# Patient Record
Sex: Female | Born: 1945
Health system: Southern US, Community
[De-identification: ages and names within clinical notes are randomized; demographics above are authoritative.]

## PROBLEM LIST (undated history)

## (undated) DIAGNOSIS — I700 Atherosclerosis of aorta: Secondary | ICD-10-CM

## (undated) DIAGNOSIS — Z5189 Encounter for other specified aftercare: Secondary | ICD-10-CM

## (undated) DIAGNOSIS — H269 Unspecified cataract: Secondary | ICD-10-CM

## (undated) DIAGNOSIS — I10 Essential (primary) hypertension: Secondary | ICD-10-CM

## (undated) DIAGNOSIS — F32A Depression, unspecified: Secondary | ICD-10-CM

## (undated) DIAGNOSIS — R931 Abnormal findings on diagnostic imaging of heart and coronary circulation: Secondary | ICD-10-CM

## (undated) DIAGNOSIS — E785 Hyperlipidemia, unspecified: Secondary | ICD-10-CM

## (undated) DIAGNOSIS — D649 Anemia, unspecified: Secondary | ICD-10-CM

## (undated) DIAGNOSIS — M549 Dorsalgia, unspecified: Secondary | ICD-10-CM

## (undated) DIAGNOSIS — M353 Polymyalgia rheumatica: Secondary | ICD-10-CM

## (undated) DIAGNOSIS — I6381 Other cerebral infarction due to occlusion or stenosis of small artery: Secondary | ICD-10-CM

## (undated) DIAGNOSIS — H33309 Unspecified retinal break, unspecified eye: Secondary | ICD-10-CM

## (undated) DIAGNOSIS — H348392 Tributary (branch) retinal vein occlusion, unspecified eye, stable: Secondary | ICD-10-CM

## (undated) DIAGNOSIS — G8190 Hemiplegia, unspecified affecting unspecified side: Secondary | ICD-10-CM

## (undated) DIAGNOSIS — S46009A Unspecified injury of muscle(s) and tendon(s) of the rotator cuff of unspecified shoulder, initial encounter: Secondary | ICD-10-CM

## (undated) DIAGNOSIS — F419 Anxiety disorder, unspecified: Secondary | ICD-10-CM

## (undated) DIAGNOSIS — M1990 Unspecified osteoarthritis, unspecified site: Secondary | ICD-10-CM

## (undated) HISTORY — DX: Unspecified retinal break, unspecified eye: H33.309

## (undated) HISTORY — DX: Polymyalgia rheumatica: M35.3

## (undated) HISTORY — DX: Anemia, unspecified: D64.9

## (undated) HISTORY — PX: SPHINCTEROTOMY: SHX5279

## (undated) HISTORY — DX: Dorsalgia, unspecified: M54.9

## (undated) HISTORY — DX: Other cerebral infarction due to occlusion or stenosis of small artery: I63.81

## (undated) HISTORY — DX: Abnormal findings on diagnostic imaging of heart and coronary circulation: R93.1

## (undated) HISTORY — DX: Encounter for other specified aftercare: Z51.89

## (undated) HISTORY — DX: Atherosclerosis of aorta: I70.0

## (undated) HISTORY — PX: ABDOMINAL HYSTERECTOMY: SHX81

## (undated) HISTORY — PX: CHOLECYSTECTOMY: SHX55

## (undated) HISTORY — DX: Depression, unspecified: F32.A

## (undated) HISTORY — DX: Unspecified osteoarthritis, unspecified site: M19.90

## (undated) HISTORY — DX: Hyperlipidemia, unspecified: E78.5

## (undated) HISTORY — DX: Unspecified cataract: H26.9

## (undated) HISTORY — DX: Anxiety disorder, unspecified: F41.9

## (undated) HISTORY — DX: Unspecified injury of muscle(s) and tendon(s) of the rotator cuff of unspecified shoulder, initial encounter: S46.009A

## (undated) HISTORY — DX: Essential (primary) hypertension: I10

## (undated) HISTORY — PX: UPPER GASTROINTESTINAL ENDOSCOPY: SHX188

## (undated) HISTORY — DX: Tributary (branch) retinal vein occlusion, unspecified eye, stable: H34.8392

## (undated) HISTORY — DX: Hemiplegia, unspecified affecting unspecified side: G81.90

---

## 1998-07-01 ENCOUNTER — Other Ambulatory Visit: Admission: RE | Admit: 1998-07-01 | Discharge: 1998-07-01 | Payer: Self-pay | Admitting: Obstetrics and Gynecology

## 1999-08-16 ENCOUNTER — Other Ambulatory Visit: Admission: RE | Admit: 1999-08-16 | Discharge: 1999-08-16 | Payer: Self-pay | Admitting: Obstetrics and Gynecology

## 2001-02-13 ENCOUNTER — Encounter: Payer: Self-pay | Admitting: Internal Medicine

## 2001-02-13 ENCOUNTER — Ambulatory Visit (HOSPITAL_COMMUNITY): Admission: RE | Admit: 2001-02-13 | Discharge: 2001-02-13 | Payer: Self-pay | Admitting: Internal Medicine

## 2001-04-18 ENCOUNTER — Other Ambulatory Visit: Admission: RE | Admit: 2001-04-18 | Discharge: 2001-04-18 | Payer: Self-pay | Admitting: Obstetrics and Gynecology

## 2002-08-31 ENCOUNTER — Other Ambulatory Visit: Admission: RE | Admit: 2002-08-31 | Discharge: 2002-08-31 | Payer: Self-pay | Admitting: Obstetrics and Gynecology

## 2003-04-13 ENCOUNTER — Emergency Department (HOSPITAL_COMMUNITY): Admission: AD | Admit: 2003-04-13 | Discharge: 2003-04-13 | Payer: Self-pay | Admitting: Emergency Medicine

## 2003-04-13 ENCOUNTER — Encounter: Payer: Self-pay | Admitting: Emergency Medicine

## 2003-05-11 ENCOUNTER — Emergency Department (HOSPITAL_COMMUNITY): Admission: EM | Admit: 2003-05-11 | Discharge: 2003-05-11 | Payer: Self-pay | Admitting: Emergency Medicine

## 2003-05-11 ENCOUNTER — Encounter: Payer: Self-pay | Admitting: Emergency Medicine

## 2004-02-15 ENCOUNTER — Ambulatory Visit (HOSPITAL_COMMUNITY): Admission: RE | Admit: 2004-02-15 | Discharge: 2004-02-15 | Payer: Self-pay | Admitting: Internal Medicine

## 2004-02-24 ENCOUNTER — Ambulatory Visit (HOSPITAL_COMMUNITY): Admission: RE | Admit: 2004-02-24 | Discharge: 2004-02-24 | Payer: Self-pay | Admitting: Internal Medicine

## 2004-03-08 IMAGING — CT CT HEAD W/O CM
1 series · 16 of 30 positions shown, 20 images · non-contrast
Comparison: none

FINDINGS
CLINICAL DATA: RIGHT LEG WEAKNESS.  HYPERTENSIVE.
COMPUTERIZED CRANIAL TOMOGRAPHY WITHOUT CONTRAST- 05/11/2003
THERE IS NO EVIDENCE OF AN ACUTE HEMORRHAGE, INFARCTION, OR MASS LESION.  IMAGES THROUGH THE BRAIN
STEM DEMONSTRATE SOME TINY LUCENCIES IN THE PONS BUT THIS MAY REPRESENT ARTIFACT CAUSED BY THE
ADJACENT TEMPORAL BONES.  VENTRICLES ARE NORMAL IN SIZE AND CONFIGURATION.  NO BONY ABNORMALITY.
IMPRESSION
NO SIGNIFICANT ABNORMALITY.

[Series 2: trauma head · axial · 0.47mm/px · z∈[+156,+297]mm · 16 of 30 slices shown, 20 images]
[im 2/30  brain]
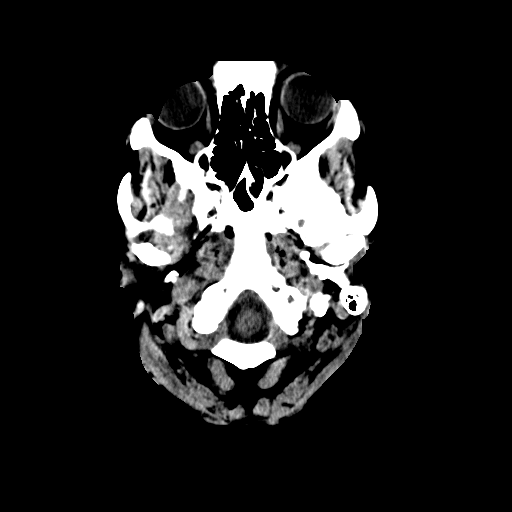
[im 2/30  bone]
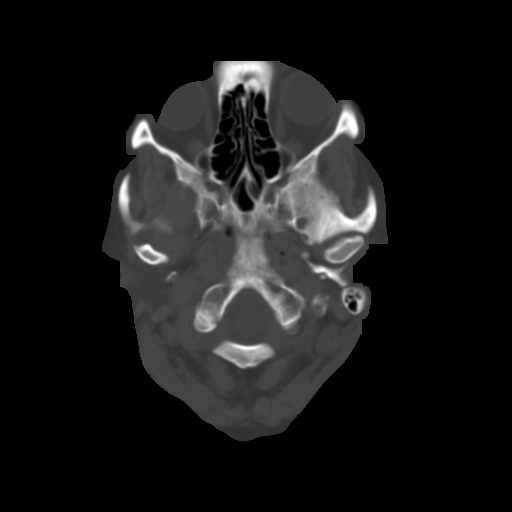
[im 4/30  brain]
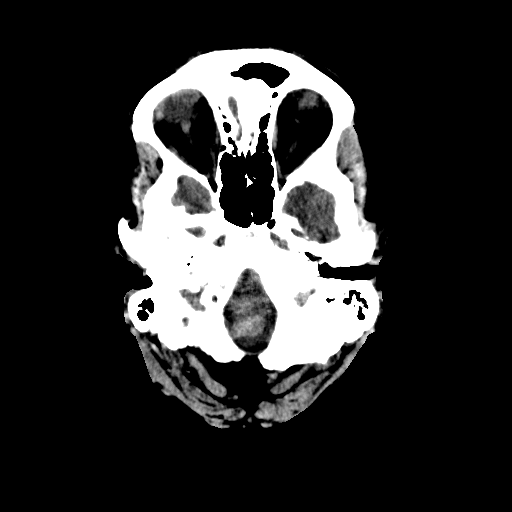
[im 6/30  brain]
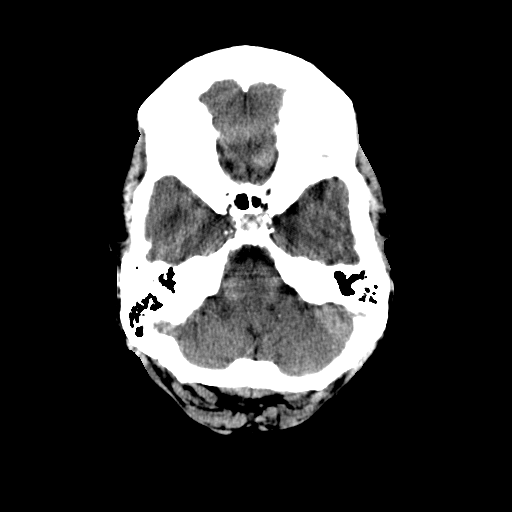
[im 8/30  brain]
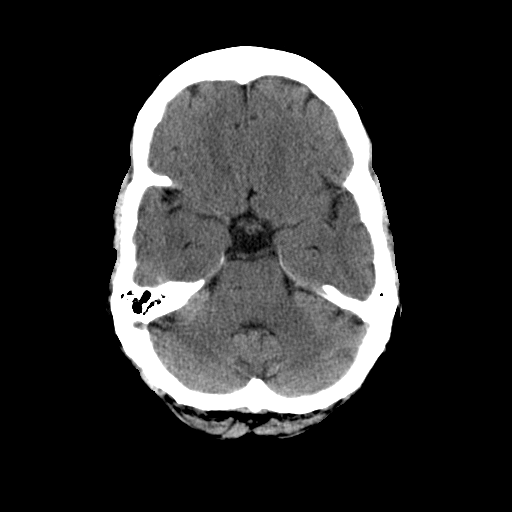
[im 9/30  brain]
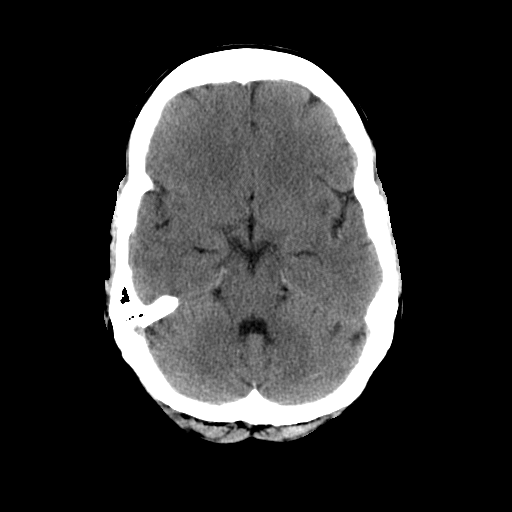
[im 9/30  bone]
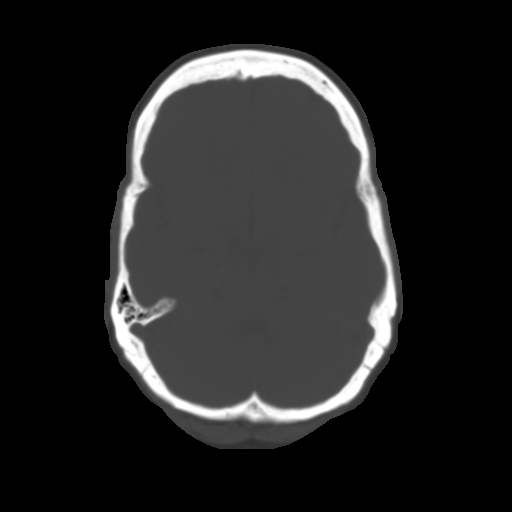
[im 11/30  brain]
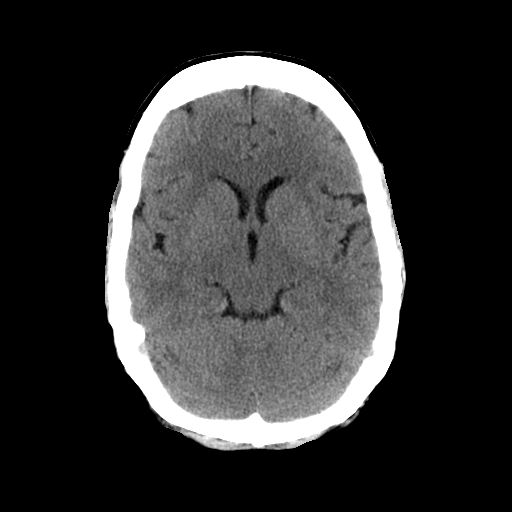
[im 13/30  brain]
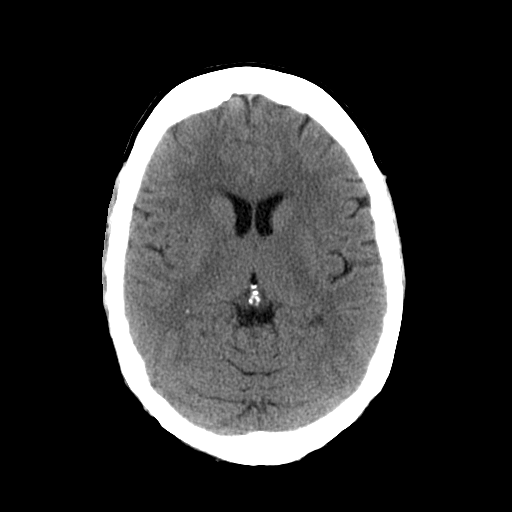
[im 15/30  brain]
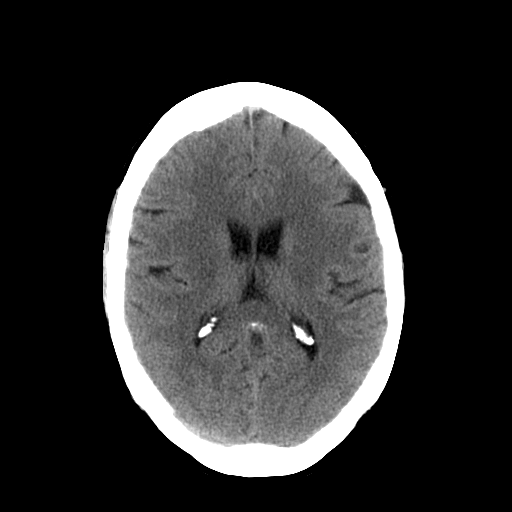
[im 16/30  brain]
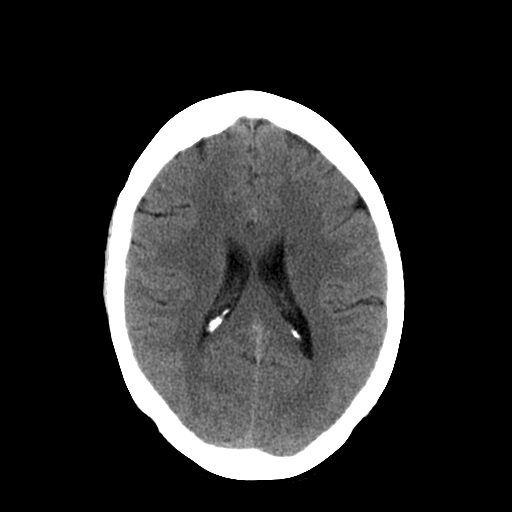
[im 16/30  bone]
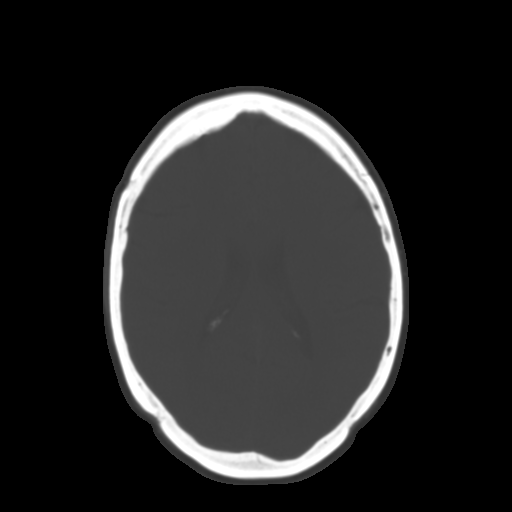
[im 18/30  brain]
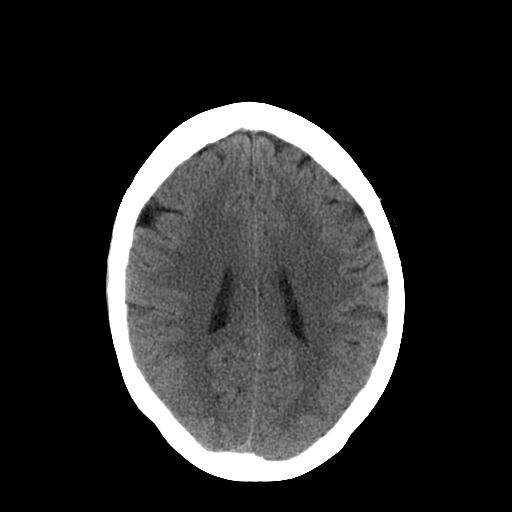
[im 20/30  brain]
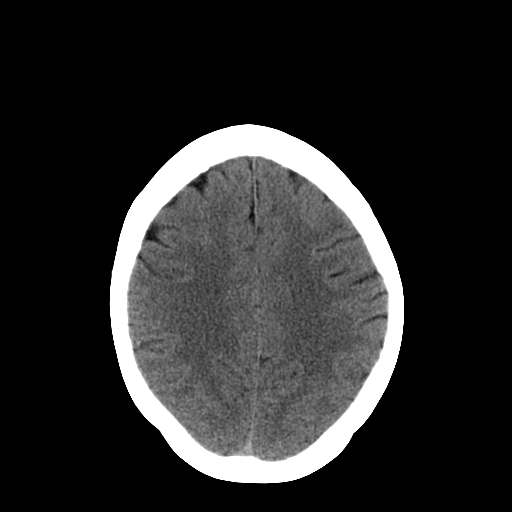
[im 22/30  brain]
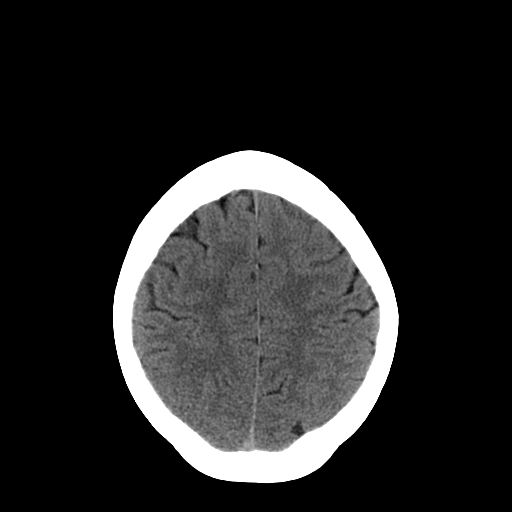
[im 23/30  brain]
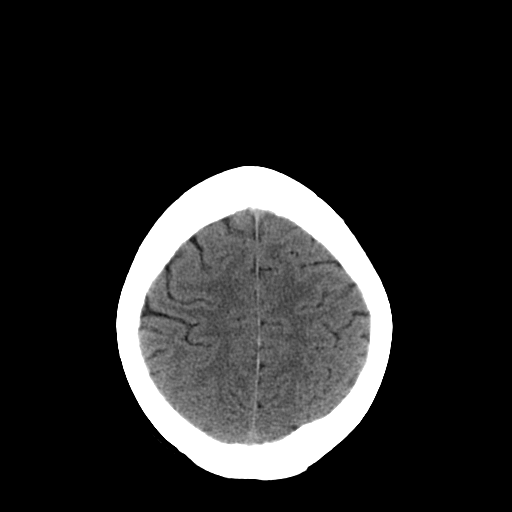
[im 23/30  bone]
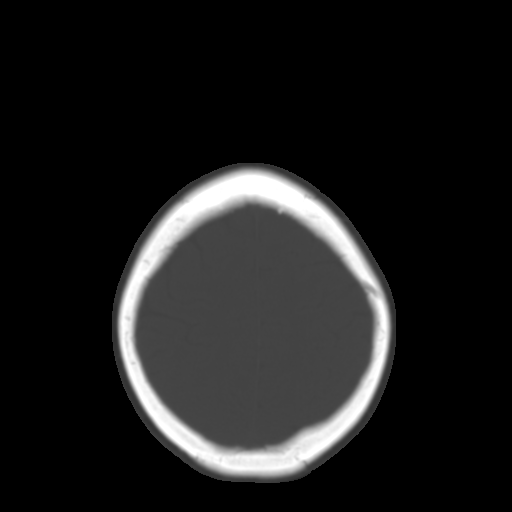
[im 25/30  brain]
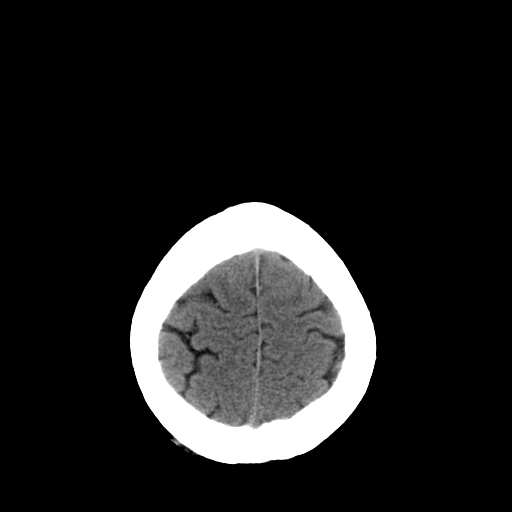
[im 27/30  brain]
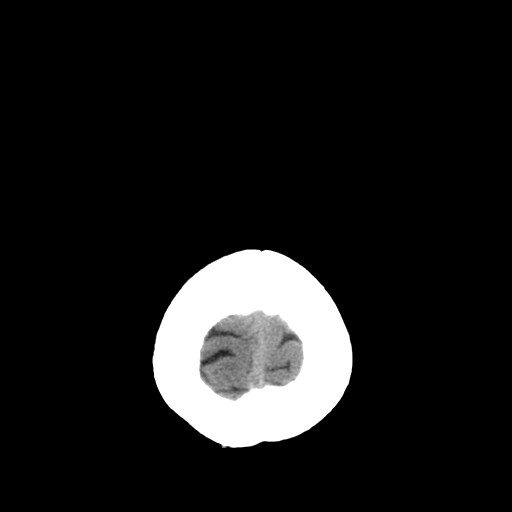
[im 29/30  brain]
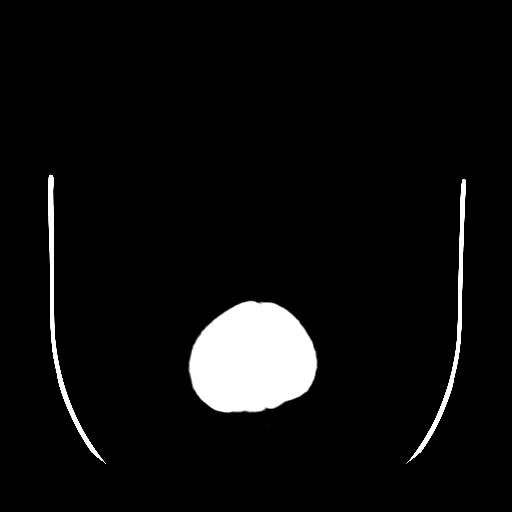

[16 of 30 positions shown; findings below may reference images not displayed]

## 2004-05-01 ENCOUNTER — Ambulatory Visit (HOSPITAL_COMMUNITY): Admission: RE | Admit: 2004-05-01 | Discharge: 2004-05-01 | Payer: Self-pay | Admitting: Neurology

## 2004-12-07 ENCOUNTER — Ambulatory Visit (HOSPITAL_COMMUNITY): Admission: RE | Admit: 2004-12-07 | Discharge: 2004-12-07 | Payer: Self-pay | Admitting: Neurology

## 2005-01-05 ENCOUNTER — Ambulatory Visit: Payer: Self-pay

## 2005-11-15 ENCOUNTER — Ambulatory Visit: Payer: Self-pay | Admitting: Cardiology

## 2005-11-18 ENCOUNTER — Emergency Department (HOSPITAL_COMMUNITY): Admission: EM | Admit: 2005-11-18 | Discharge: 2005-11-18 | Payer: Self-pay | Admitting: Family Medicine

## 2006-08-14 ENCOUNTER — Ambulatory Visit (HOSPITAL_COMMUNITY): Admission: RE | Admit: 2006-08-14 | Discharge: 2006-08-14 | Payer: Self-pay | Admitting: Internal Medicine

## 2006-08-14 ENCOUNTER — Ambulatory Visit: Payer: Self-pay | Admitting: Internal Medicine

## 2006-09-25 ENCOUNTER — Ambulatory Visit: Payer: Self-pay | Admitting: Internal Medicine

## 2006-09-25 LAB — CONVERTED CEMR LAB
ALT: 16 units/L (ref 0–40)
AST: 29 units/L (ref 0–37)
Albumin: 3.5 g/dL (ref 3.5–5.2)
Alkaline Phosphatase: 62 units/L (ref 39–117)
BUN: 13 mg/dL (ref 6–23)
Basophils Absolute: 0.1 10*3/uL (ref 0.0–0.1)
Basophils Relative: 0.7 % (ref 0.0–1.0)
Bilirubin, Direct: 0.3 mg/dL (ref 0.0–0.3)
CO2: 31 meq/L (ref 19–32)
Calcium: 9.1 mg/dL (ref 8.4–10.5)
Chloride: 100 meq/L (ref 96–112)
Cholesterol: 202 mg/dL (ref 0–200)
Creatinine, Ser: 0.9 mg/dL (ref 0.4–1.2)
Direct LDL: 149.1 mg/dL
Eosinophils Absolute: 0.1 10*3/uL (ref 0.0–0.6)
Eosinophils Relative: 1 % (ref 0.0–5.0)
GFR calc Af Amer: 82 mL/min
GFR calc non Af Amer: 68 mL/min
Glucose, Bld: 98 mg/dL (ref 70–99)
HCT: 41.3 % (ref 36.0–46.0)
HDL: 43.4 mg/dL (ref >39.0)
Hemoglobin: 14.3 g/dL (ref 12.0–15.0)
Ketones, ur: NEGATIVE mg/dL
Leukocytes, UA: NEGATIVE
Lymphocytes Relative: 38.8 % (ref 12.0–46.0)
MCHC: 34.7 g/dL (ref 30.0–36.0)
MCV: 83.8 fL (ref 78.0–100.0)
Monocytes Absolute: 0.7 10*3/uL (ref 0.2–0.7)
Monocytes Relative: 8.2 % (ref 3.0–11.0)
Neutro Abs: 4.4 10*3/uL (ref 1.4–7.7)
Neutrophils Relative %: 51.3 % (ref 43.0–77.0)
Nitrite: NEGATIVE
Platelets: 249 10*3/uL (ref 150–400)
Potassium: 3.5 meq/L (ref 3.5–5.1)
RBC: 4.93 M/uL (ref 3.87–5.11)
RDW: 13 % (ref 11.5–14.6)
Sodium: 141 meq/L (ref 135–145)
Specific Gravity, Urine: >=1.03 (ref 1.000–1.03)
TSH: 2.24 microintl units/mL (ref 0.35–5.50)
Total Bilirubin: 1.3 mg/dL — ABNORMAL HIGH (ref 0.3–1.2)
Total CHOL/HDL Ratio: 4.7
Total Protein, Urine: NEGATIVE mg/dL
Total Protein: 7.2 g/dL (ref 6.0–8.3)
Triglycerides: 128 mg/dL (ref 0–149)
Urine Glucose: NEGATIVE mg/dL
Urobilinogen, UA: 0.2 (ref 0.0–1.0)
VLDL: 26 mg/dL (ref 0–40)
WBC: 8.6 10*3/uL (ref 4.5–10.5)
pH: 5.5 (ref 5.0–8.0)

## 2006-10-02 ENCOUNTER — Ambulatory Visit: Payer: Self-pay | Admitting: Internal Medicine

## 2006-10-22 ENCOUNTER — Ambulatory Visit: Payer: Self-pay | Admitting: Internal Medicine

## 2006-10-22 LAB — CONVERTED CEMR LAB
ALT: 14 units/L (ref 0–40)
AST: 23 units/L (ref 0–37)
Cholesterol: 202 mg/dL (ref 0–200)
Direct LDL: 127.3 mg/dL
HDL: 37.2 mg/dL — ABNORMAL LOW (ref >39.0)
Total CHOL/HDL Ratio: 5.4
Triglycerides: 287 mg/dL (ref 0–149)
VLDL: 57 mg/dL — ABNORMAL HIGH (ref 0–40)

## 2007-08-12 ENCOUNTER — Encounter: Payer: Self-pay | Admitting: Internal Medicine

## 2007-08-12 DIAGNOSIS — H348392 Tributary (branch) retinal vein occlusion, unspecified eye, stable: Secondary | ICD-10-CM | POA: Insufficient documentation

## 2007-08-12 DIAGNOSIS — I10 Essential (primary) hypertension: Secondary | ICD-10-CM | POA: Insufficient documentation

## 2007-12-22 ENCOUNTER — Telehealth: Payer: Self-pay | Admitting: Internal Medicine

## 2008-01-26 ENCOUNTER — Telehealth: Payer: Self-pay | Admitting: Internal Medicine

## 2008-02-08 ENCOUNTER — Ambulatory Visit: Payer: Self-pay | Admitting: Internal Medicine

## 2008-02-08 ENCOUNTER — Inpatient Hospital Stay (HOSPITAL_COMMUNITY): Admission: EM | Admit: 2008-02-08 | Discharge: 2008-02-10 | Payer: Self-pay | Admitting: Emergency Medicine

## 2008-02-10 ENCOUNTER — Telehealth: Payer: Self-pay | Admitting: Internal Medicine

## 2008-02-18 ENCOUNTER — Encounter: Payer: Self-pay | Admitting: Internal Medicine

## 2008-02-18 ENCOUNTER — Ambulatory Visit: Payer: Self-pay

## 2008-02-23 ENCOUNTER — Telehealth: Payer: Self-pay | Admitting: Internal Medicine

## 2008-03-03 ENCOUNTER — Telehealth: Payer: Self-pay | Admitting: Internal Medicine

## 2008-03-11 ENCOUNTER — Ambulatory Visit: Payer: Self-pay

## 2009-01-19 ENCOUNTER — Telehealth: Payer: Self-pay | Admitting: Internal Medicine

## 2009-08-11 ENCOUNTER — Telehealth (INDEPENDENT_AMBULATORY_CARE_PROVIDER_SITE_OTHER): Payer: Self-pay | Admitting: *Deleted

## 2009-08-19 ENCOUNTER — Encounter (INDEPENDENT_AMBULATORY_CARE_PROVIDER_SITE_OTHER): Payer: Self-pay | Admitting: *Deleted

## 2009-08-26 ENCOUNTER — Ambulatory Visit: Payer: Self-pay | Admitting: Endocrinology

## 2009-11-02 ENCOUNTER — Ambulatory Visit: Payer: Self-pay | Admitting: Internal Medicine

## 2009-11-02 LAB — CONVERTED CEMR LAB
ALT: 16 units/L (ref 0–35)
AST: 21 units/L (ref 0–37)
Albumin: 4.1 g/dL (ref 3.5–5.2)
Alkaline Phosphatase: 61 units/L (ref 39–117)
BUN: 14 mg/dL (ref 6–23)
Basophils Absolute: 0 10*3/uL (ref 0.0–0.1)
Basophils Relative: 0.5 % (ref 0.0–3.0)
Bilirubin Urine: NEGATIVE
Bilirubin, Direct: 0.1 mg/dL (ref 0.0–0.3)
CO2: 30 meq/L (ref 19–32)
Calcium: 9.4 mg/dL (ref 8.4–10.5)
Chloride: 103 meq/L (ref 96–112)
Cholesterol: 260 mg/dL — ABNORMAL HIGH (ref 0–200)
Creatinine, Ser: 0.8 mg/dL (ref 0.4–1.2)
Direct LDL: 173.7 mg/dL
Eosinophils Absolute: 0.1 10*3/uL (ref 0.0–0.7)
Eosinophils Relative: 0.8 % (ref 0.0–5.0)
GFR calc non Af Amer: 76.91 mL/min (ref >60)
Glucose, Bld: 93 mg/dL (ref 70–99)
HCT: 42.3 % (ref 36.0–46.0)
HDL: 49.7 mg/dL (ref >39.00)
Hemoglobin: 14.4 g/dL (ref 12.0–15.0)
Ketones, ur: NEGATIVE mg/dL
Leukocytes, UA: NEGATIVE
Lymphocytes Relative: 32.4 % (ref 12.0–46.0)
Lymphs Abs: 2.6 10*3/uL (ref 0.7–4.0)
MCHC: 34.1 g/dL (ref 30.0–36.0)
MCV: 85.5 fL (ref 78.0–100.0)
Monocytes Absolute: 0.5 10*3/uL (ref 0.1–1.0)
Monocytes Relative: 6.5 % (ref 3.0–12.0)
Neutro Abs: 4.8 10*3/uL (ref 1.4–7.7)
Neutrophils Relative %: 59.8 % (ref 43.0–77.0)
Nitrite: NEGATIVE
Platelets: 233 10*3/uL (ref 150.0–400.0)
Potassium: 3.6 meq/L (ref 3.5–5.1)
RBC: 4.95 M/uL (ref 3.87–5.11)
RDW: 14.8 % — ABNORMAL HIGH (ref 11.5–14.6)
Sodium: 143 meq/L (ref 135–145)
Specific Gravity, Urine: >=1.03 (ref 1.000–1.030)
TSH: 1.53 microintl units/mL (ref 0.35–5.50)
Total Bilirubin: 0.8 mg/dL (ref 0.3–1.2)
Total CHOL/HDL Ratio: 5
Total Protein, Urine: NEGATIVE mg/dL
Total Protein: 7.1 g/dL (ref 6.0–8.3)
Triglycerides: 233 mg/dL — ABNORMAL HIGH (ref 0.0–149.0)
Urine Glucose: NEGATIVE mg/dL
Urobilinogen, UA: 0.2 (ref 0.0–1.0)
VLDL: 46.6 mg/dL — ABNORMAL HIGH (ref 0.0–40.0)
WBC: 8.1 10*3/uL (ref 4.5–10.5)
pH: 5 (ref 5.0–8.0)

## 2009-11-08 ENCOUNTER — Ambulatory Visit: Payer: Self-pay | Admitting: Internal Medicine

## 2009-11-08 DIAGNOSIS — M67919 Unspecified disorder of synovium and tendon, unspecified shoulder: Secondary | ICD-10-CM | POA: Insufficient documentation

## 2009-11-08 DIAGNOSIS — H33309 Unspecified retinal break, unspecified eye: Secondary | ICD-10-CM | POA: Insufficient documentation

## 2009-11-08 DIAGNOSIS — M719 Bursopathy, unspecified: Secondary | ICD-10-CM

## 2009-11-22 DIAGNOSIS — I6350 Cerebral infarction due to unspecified occlusion or stenosis of unspecified cerebral artery: Secondary | ICD-10-CM | POA: Insufficient documentation

## 2009-11-22 DIAGNOSIS — E785 Hyperlipidemia, unspecified: Secondary | ICD-10-CM | POA: Insufficient documentation

## 2009-11-22 DIAGNOSIS — M549 Dorsalgia, unspecified: Secondary | ICD-10-CM | POA: Insufficient documentation

## 2009-11-23 ENCOUNTER — Encounter: Payer: Self-pay | Admitting: Internal Medicine

## 2009-12-13 ENCOUNTER — Encounter: Payer: Self-pay | Admitting: Internal Medicine

## 2010-01-11 ENCOUNTER — Encounter: Payer: Self-pay | Admitting: Internal Medicine

## 2010-01-11 HISTORY — PX: COLONOSCOPY: SHX174

## 2010-07-05 ENCOUNTER — Encounter: Payer: Self-pay | Admitting: Internal Medicine

## 2010-08-15 NOTE — Letter (Signed)
Summary: Primary Care Appointment Letter  [facility]  [address] [place], [place] [address]   Phone: [phone]  Fax: [phone]    12/13/2009 MRN: [id number]  [name] [address] [place], [place]  [address]  Dear Ms. Sedlack,   Your Primary Care Physician Jacques Navy MD has indicated that:    ___X____it is time to schedule an appointment with Dr. Debby Bud.  Please call the office to schedule.    _______you missed your appointment on______ and need to call and          reschedule.    _______you need to have lab work done.    _______you need to schedule an appointment discuss lab or test results.    _______you need to call to reschedule your appointment that is                       scheduled on _________.     Please call our office as soon as possible. Our phone number is [phone]. Please press option 1. Our office is open 8a-12noon and 1p-5p, Monday through Friday.     Thank you,    [place] [facility] Sched[facility]

## 2010-08-15 NOTE — Letter (Signed)
Summary: Generic Letter  [facility]  [address] [place], [place] [address]   Phone: [phone]  Fax: [phone]    08/19/2009  KALAYAH LESKE [address] [place], [place]  [address]  Dear Ms. Wirthlin,   Our office has tried to contact you several times with no sucsess. Please give our office a call at [phone] to set up an appointment with Dr Debby Bud.          Sincerely,   Ami Bullins CMA
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
## 2010-08-15 NOTE — Progress Notes (Signed)
  Phone Note Other Incoming   Summary of Call: I just refill pt's Univasc with one refill. When do you suggest pt come in the office for an office visit. she hasn't been here since 2008. Please Advise. Thank You Initial call taken by: Ami Bullins CMA,  August 11, 2009 2:14 PM  Follow-up for Phone Call        she needs to come in during this month's prescription Follow-up by: Jacques Navy MD,  August 11, 2009 5:44 PM  Additional Follow-up for Phone Call Additional follow up Details #1::        lm for pt to call office to set up appt. Additional Follow-up by: Ami Bullins CMA,  August 12, 2009 8:51 AM    Additional Follow-up for Phone Call Additional follow up Details #2::    will send note to pt to inform her to call office to sch appt. Follow-up by: Ami Bullins CMA,  August 19, 2009 1:39 PM

## 2010-08-15 NOTE — Procedures (Signed)
Summary: Colonoscopy / [facility]  Colonoscopy / [facility]   Imported By: Lennie Odor 01/27/2010 14:49:44  _____________________________________________________________________  External Attachment:    Type:   Image     Comment:   External Document

## 2010-08-17 NOTE — Letter (Signed)
Summary: [facility]  [facility]   Imported By: Lester [place] 07/13/2010 11:32:26  _____________________________________________________________________  External Attachment:    Type:   Image     Comment:   External Document

## 2010-08-18 NOTE — Letter (Signed)
Summary: [facility]   Imported By: Lester [place] 12/08/2009 08:59:38  _____________________________________________________________________  External Attachment:    Type:   Image     Comment:   External Document

## 2010-11-11 ENCOUNTER — Other Ambulatory Visit: Payer: Self-pay | Admitting: Internal Medicine
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
## 2010-11-28 NOTE — Discharge Summary (Signed)
NAMEXANDRA, LARAMEE              ACCOUNT NO.:  [id number]   MEDICAL RECORD NO.:  [id number]          PATIENT TYPE:  INP   LOCATION:  4731                         FACILITY:  [facility]   PHYSICIAN:  Valerie A. Felicity Coyer, MDDATE OF BIRTH:  02/08/1946   DATE OF ADMISSION:  02/08/2008  DATE OF DISCHARGE:  02/10/2008                               DISCHARGE SUMMARY   DISCHARGE DIAGNOSES:  1. Uncontrolled hypertension.  2. Atypical chest pain.  3. Hyperlipidemia.   HISTORY OF PRESENT ILLNESS:  Ms. Waheed is a 65 year old white female  with past medical history of hypertension and hyperlipidemia who  presented to [facility] on day of admission with  increased blood pressure over the past 2-3 days.  The patient describes  checking blood pressure at home with significantly elevated systolic  blood pressures in the 200s.  The patient is fairly asymptomatic per her  report, however, does report some increased dizziness with head  movement.  She denied any vertigo or weakness.  Also of note upon  evaluation, the patient reported intermittent chest pain worse on  movement with increased fatigue times several years.  Upon evaluation in  the ER, the patient found to have blood pressure of 205/92 with repeat  blood pressures 184/95.  Lab work upon admission was unremarkable.  CT  scan of the head was unremarkable.  However, due to the patient's risk  factors and elevated blood pressure, the patient was admitted for  further evaluation and treatment.   CONSULTATIONS DURING THIS HOSPITALIZATION:  [facility], Bevelyn Buckles. Bensimhon, MD   PAST MEDICAL HISTORY:  Hypertension.   COURSE OF HOSPITALIZATION:  1. Uncontrolled hypertension.  The patient admit that she feels      uncontrolled hypertension, new onset secondary to new prescription      received in mail.  She feels the pills are different from that she      was taking prior, however, review of prescription bottles  reveals      same medications.  However, they are different in appearance.  The      patient also admits to labs and medication while awaiting on mail-      order to arrive.  At the time of dictation, Norvasc has been added      to the patient's blood pressure medication regimen; however, she is      quite reluctant to begin any new medications despite instruction,      otherwise.  A long discussion had with the patient concerning risk      factors of high blood pressure.  She verified understanding and      agreed to start new medications to be followed up by primary care      physician a followup appointment.  2. Atypical chest pain.  Of note, the patient with risk factors      including hyperlipidemia, obesity, and with significant family      history of CAD.  The patient with very nonspecific complaints of      approximately 1-2 years of intermittent chest pain, midsternal,  nonradiating, and worse upon movement also with increased fatigue      x1 to 2 years.  Due to the patient's complaints and multiple risk      factors, Cardiology was asked to see the patient in consultation.      Cardiac enzymes were cycled and negative x3.  The patient's EKG was      nondiagnostic.  Cardiology has recommended outpatient ETT Myoview      as well as renovascular ultrasound for uncontrolled hypertension.      Please see complete Cardiology consultation note for further      details.  3. Hyperlipidemia.  Upon discussion with the patient, the patient      discontinued her Lipitor some time in the last year.  She felt it      was causing a constipation.  When discussed fasting lipid panel      findings with the patient, she is somewhat agreeable to starting      Lipitor again.  She does not want any new medication, but is      willing to try the Lipitor.  Again, the importance of med      compliance was discussed with the patient.   MEDICATIONS AT THE TIME OF DISCHARGE:  1. Aspirin 81 mg p.o.  daily.  2. Norvasc 5 mg p.o. daily.  Note, this is a new medication.  3. Univasc 15 mg p.o. b.i.d.  4. Hydrochlorothiazide 25 mg p.o. daily.  5. Lipitor 10 mg p.o. nightly.  Note, this is a new medication as the      patient was not taking as previously described.   OUTPATIENT SCHEDULED TEST:  1. Renal artery ultrasound to be done on March 11, 2008, at 8:30 a.m.  2. Exercise stress test to be done on February 18, 2008, and 9:15 a.m. at      [employer].   PERTINENT LABORATORY WORK DURING HOSPITALIZATION:  Cardiac enzymes again  negative x3.  Total cholesterol 241, triglycerides 201, HDL 35, LDL 166,  and TSH 1.610.   DISPOSITION:  The patient felt medically stable for discharge home at  this time.  She is instructed to follow up with scheduled test as  mentioned above.  In addition, the patient is scheduled for followup  with her primary care physician, Dr. Illene Regulus on Monday, February 23, 2008, at 11:10 a.m. at which time she may need further counseling  regarding compliance with medications.      Cordelia Pen, NP      Raenette Rover. Felicity Coyer, MD  Electronically Signed    LE/MEDQ  D:  02/10/2008  T:  02/11/2008  Job:  [id number]   cc:   Rosalyn Gess. Norins, MD  Bevelyn Buckles. Bensimhon, MD

## 2010-11-28 NOTE — H&P (Signed)
NAMEJAI, BEAR NO.:  [id number]   MEDICAL RECORD NO.:  [id number]          PATIENT TYPE:  EMS   LOCATION:  MAJO                         FACILITY:  [facility]   PHYSICIAN:  Therisa Doyne, MD    DATE OF BIRTH:  [birth year]   DATE OF ADMISSION:  02/08/2008  DATE OF DISCHARGE:                              HISTORY & PHYSICAL   PRIMARY CARE PHYSICIAN:  Rosalyn Gess. Norins, M.D. at [facility]   CHIEF COMPLAINT:  Elevated blood pressure.   HISTORY OF PRESENT ILLNESS:  A 65 year old white female with a past  medical history significant for hypertension who presents with increased  blood pressure over the past 2-3 days.  The patient reports that she  recently got a new shipment of Univasc through a mail-order prescription  supplier.  Since that time she has been checking her blood pressure and  has noted that it has been significantly elevated with systolic blood  pressure in the 200s.  She has been fairly asymptomatic, but does report  increased dizziness with head movement.  She denies any vertigo.  She  notes occasional headaches, but states this has been chronic and has not  changed in frequency.  She denies any active chest pain or shortness of  breath or other complaints.   The patient does report a history of chest pain while gardening this  past week, but noticed that the chest pain was only when she bent down  to dig in the ground and when she stood up it went away.  She does  complain of dyspnea on exertion when she climbs a flight of stairs, but  states that this has been present for the past 2-3 years and has been  unchanged.  She denies any chest pain with exertion.   REVIEW OF SYSTEMS:  All systems were reviewed and were negative except  as mentioned above in the history of present illness.   PAST MEDICAL HISTORY:  Hypertension.   SOCIAL HISTORY:  The patient lives in [place].  She denies tobacco  or alcohol.   FAMILY HISTORY:  Positive for  hypertension as well as the fact that her  mother died in her 32s secondary to myocardial infarction.   ALLERGIES:  No known drug allergies.   MEDICATIONS:  1. Toprol XL 25 mg b.i.d.  2. Hydrochlorothiazide 25 mg daily.  3. Univasc 15 mg b.i.d.   PHYSICAL EXAMINATION:  VITAL SIGNS:  Temperature 97.9, blood pressure  205/92 with a repeat blood pressure of 184/95, pulse 65, respirations  20, oxygen saturation 98% on room air.  GENERAL:  No acute distress.  HEENT:  Normocephalic and atraumatic.  Pupils equal, round, and reactive  to light and accommodation.  Extraocular movements intact.  Oropharynx  pink and moist without any lesions.  NECK:  Supple with no lymphadenopathy and no jugular venous distention.  No masses.  CARDIOVASCULAR:  Regular rate and rhythm.  No murmurs, rubs, or gallops.  CHEST:  Clear to auscultation bilaterally.  ABDOMEN:  Positive bowel sounds.  Soft, nontender, and nondistended.  EXTREMITIES:  No cyanosis, clubbing, or edema.  NEUROLOGY:  Alert and oriented x3.  Cranial nerves II-XII grossly intact  with no focal deficits.  SKIN:  No rashes.  BACK:  No CVA tenderness.   LABORATORY DATA:  Unremarkable chemistry-8 panel with a normal BUN and  creatinine of 1.1, troponins negative x1 set.  Urinalysis was  unremarkable.   CT scan of the head; no acute intracranial disease.   Chest x-ray; no acute cardiopulmonary disease.   EKG shows normal sinus rhythm at 60 beats per minute with nonspecific ST  T wave changes as well as T wave flattening and T wave inversions in the  lateral leads including lead 1, aVL, V5 and V6.  These nonspecific  changes were present on prior EKGs but are more pronounced today.   ASSESSMENT:  The patient is a 65 year old white female with past medical  history significant for hypertension who presents with elevated systolic  blood pressures over the past 1 week and new nonspecific ST T wave  changes on EKG.   PLAN:  1. We will  [facility] in      [place] A. Felicity Coyer, M.D. care to a telemetry unit.   1. Hypertension with new T wave inversions laterally.  The EKG changes      are nonspecific and are likely related to the patient's underlying      hypertension.  However, she does have risk factors for coronary      artery disease including hypertension and a positive family      history.  Because of this, we will monitor on telemetry and rule      the patient out for myocardial infarction with serial cardiac      enzymes.  We will start her on aspirin 325 mg daily and continue      her home dose of beta blocker.  Check a fasting lipid profile for      risk factor modification, and consider a stress test if the patient      rules out for myocardial infarction.   1. Hypertension.  It is unclear why the patient's blood pressure is      elevated now as she reports that her systolic blood pressures have      been under good control for the past several years.  Question if      this could be secondary to renal artery stenosis.  We will check a      renal artery Duplex ultrasound to rule renal artery stenosis out.      She is currently on maximum doses of her hydrochlorothiazide,      Toprol, and Univasc.  We will continue all of these medications.      Additionally we will start her on Norvasc 5 mg daily for systolic      blood pressures that are elevated in the hospital, we will use      p.r.n. IV hydralazine.   1. Fluids, electrolytes, nutrition.  Normal saline at 75 mL per hour.      NPO after midnight for possible stress test in the morning.   1. Deep venous thrombosis prophylaxis with Lovenox.      Therisa Doyne, MD  Electronically Signed     SJT/MEDQ  D:  02/08/2008  T:  02/08/2008  Job:  [id number]

## 2010-11-28 NOTE — Assessment & Plan Note (Signed)
[facility]                                 ON-CALL NOTE   [name], [name]                   MRN:          [id number]  DATE:02/08/2008                            DOB:          Mar 29, 1946    Time is 02:28 p.m.   REGULAR DOCTOR:  Rosalyn Gess. Norins, MD   PHONE NUMBER:  [phone] with area code 336.   She was calling because of blood pressure of 207/111.  I tried calling  this phone number several times.  As per the answering service it was a  nonworking number.  The answering service told me that if she called  back they would let me know right away with a different phone number to  reach her, and tell her that I'll be waiting.     Marne A. Tower, MD  Electronically Signed    MAT/MedQ  DD: 02/08/2008  DT: 02/09/2008  Job #: [id number]

## 2010-12-01 NOTE — Assessment & Plan Note (Signed)
[facility]                           PRIMARY CARE OFFICE NOTE   GERRIE, CASTIGLIA                     MRN:          [id number]  DATE:08/14/2006                            DOB:          1945/11/09    Ms. Olvera is a 65 year old Caucasian woman who presents for  evaluation of back pain.   The patient reports that she fell at [facility] April 02, 2006  having lost her footing on a wet floor.  She initially dropped and hit  her knee then fell backwards.  About 1 hour later she started to have  some pain in her low back.  In the interval since that time patient  reports she feels like there is something crunching in her back and she  has 4 or 5 episodes since September where she has crunching and back  discomfort.  The patient reports she has sensed that there is something  out of place in her back.  She has had no loss of control of bowel or  bladder.  She has had no loss of feeling in her legs.  She has had no  significant weakness.  She does give a history of having had muscle  strains in her back in the past, but had never had any disability or  loss of function.   EXAMINATION:  Temperature was 97.6, blood pressure 144/86, pulse was 80,  weight 210.  GENERAL APPEARANCE:  This is a heavy-set Caucasian woman in no acute  distress.  BACK:  The patient was able to stand with use of her legs only.  She  could flex to touch her toes without difficulty.  She was able to walk  with a normal gait.  She could toe walk without difficulty.  She was  able to step up to the exam table with right and left leg without  assistance.  The patient had 2+ patellar tendon right and left.  The  patient had normal straight leg maneuver in the sitting position.  The  patient had normal sensation to light touch and pin prick and deep  vibratory sensation in both feet.  The patient had no tenderness to  palpitation in the costovertebral angle or along the spinous  process of  her back.   ASSESSMENT/PLAN:  Back pain.  The patient with a normal back exam, with  no evidence of any central neurologic deficits.  She has no limitations  in range of motion or strength.  She had no tenderness to palpation as  noted.   PLAN:  The patient is sent for LS spine films to make sure there is no  anatomic abnormality or derangement.  The patient should use Tylenol for  pain relief on an as needed basis, and if this fails to give her relief  I have recommended using over the counter Aleve 1 or 2 tablets in the  a.m. or p.m.  She should be careful about any gastric irritation that  may result from the use of Aleve.  She is to notify me if she has any  increasing problems with back pain,  specifically loss of strength, loss  of sensation, increasing pain, inability to do her normal activities.  She will be notified by phone tree as to results of her LS spine films.     Rosalyn Gess Norins, MD  Electronically Signed   MEN/MedQ  DD: 08/14/2006  DT: 08/14/2006  Job #: [id number]   cc:   Kalman Jewels

## 2010-12-01 NOTE — Letter (Signed)
October 02, 2006    Pramod P. Pearlean Brownie, MD  [address]  [place], [place] [address]   RE:  [name], [name]  MRN:  [id number]  /  DOB:  1946/03/22   Dear Dr. Pearlean Brownie:   Thank you very much for seeing [name] for evaluation of  progressive worsening and ongoing episodes of right-sided weakness and  loss of mobility.   [name]  has had this problem going on for several years.  She has  been seen and evaluated by Dr. Orlin Hilding in the past.  Her studies are  available in her chart with you but of note, she had an MRI scan of the  brain Dec 07, 2004, read out as showing remote lacunar infarctions of  the right and left aspects of the pons with chronic small vessel  ischemic disease and changes of cerebral deep white matter and thalamic  changes.  There was no evidence of any acute or subacute infarction at  that time.  Patient did come to have a transcranial Doppler study  performed  April 07, 2004, with final conclusion and impression  being that the TCD bubble test was positive for a PFO.  There was a  question of emboli in the left [facility] and she does have presence of a right-  to-left shunt.   In Dr. Bethann Goo last consult note, she had suggested that perhaps [name] would benefit from seeing you as a stroke specialist in regards  to second opinion and any additional treatment that would be necessary.  Dr. Orlin Hilding also mentions the potential of EEG.  In the interval since  2006, when the patient was last seen, she has had no frank events.  She  has been seen in follow-up by Dr. Samule Ohm for peripheral vascular disease  and did come to carotid Doppler studies which showed that she had some  diffuse atherosclerosis but no significant obstructive disease.  The MRI  and MRA had over-read this as showing 50-75% stenosis, not confirmed by  Doppler study.  Patient's last cardiology visit dates from Nov 15, 2005,  and she did move on to having a stress Myoview study.   The results of  that are not available to me on the chart.  I will endeavor to locate  that report and forward it to you.   Patient  has been evaluated for back pain, has been seen by Dr.  Simonne Come, last study being September 11, 2006.  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  She also may have had some derangement of the  left knee.  Patient was sent for physical conditioning and strengthening  and reports that physical therapy made a very large difference in her  condition and she has been feeling better since that time.   CURRENT MEDICATIONS:  1. Hydrochlorothiazide 25 mg daily.  2. Univasc 15 mg b.i.d.  3. Toprol XL 25 mg b.i.d.  4. Patient was on aspirin but discontinued when she had ophthalmologic      problem with a retinal tear and hemorrhage, subsequently treated by      laser therapy and she has not yet resumed taking aspirin which she      will do at today's visit.   LABORATORY DATA:  From September 25, 2006, revealed the  patient to have a  hemoglobin of 14.3 g, white count 8600 with a normal differential.  Chemistries were unremarkable with serum glucose of 98.  Kidney function  was normal with a creatinine of 0.9.  patient's cholesterol was 202 with  triglycerides 128, HDL 43.4 with an LDL of 149.1.  Thyroid function was  normal with a TSH of 2.24.   If I can provide any additional information, other than the Myoview  stress study, that would be of assistance in your evaluation, please do  not hesitate to contact me.   As always, I appreciate your assistance in the care of these nice  patients and look forward to hearing from you.    Sincerely,      Rosalyn Gess. Norins, MD  Electronically Signed    MEN/MedQ  DD: 10/02/2006  DT: 10/02/2006  Job #: [id number]

## 2011-02-05 ENCOUNTER — Other Ambulatory Visit: Payer: Self-pay | Admitting: Internal Medicine

## 2011-02-06 NOTE — Telephone Encounter (Signed)
Rx Done for 1-mth supply Noted: must have OV prior to future refill authorization.

## 2011-02-08 ENCOUNTER — Telehealth: Payer: Self-pay | Admitting: *Deleted

## 2011-02-08 MED ORDER — MOEXIPRIL HCL 15 MG PO TABS: 15.0000 mg | ORAL_TABLET | Freq: Two times a day (BID) | ORAL | Status: DC

## 2011-02-08 NOTE — Telephone Encounter (Signed)
Needs RF - 3 mth supply, done

## 2011-03-20 ENCOUNTER — Ambulatory Visit: Payer: Self-pay

## 2011-03-20 DIAGNOSIS — Z0000 Encounter for general adult medical examination without abnormal findings: Secondary | ICD-10-CM

## 2011-03-20 LAB — LIPID PANEL
Cholesterol: 245 mg/dL — ABNORMAL HIGH (ref 0–200)
HDL: 48 mg/dL (ref >39.00)
Total CHOL/HDL Ratio: 5
Triglycerides: 213 mg/dL — ABNORMAL HIGH (ref 0.0–149.0)
VLDL: 42.6 mg/dL — ABNORMAL HIGH (ref 0.0–40.0)

## 2011-03-20 LAB — CBC WITH DIFFERENTIAL/PLATELET
Basophils Absolute: 0.1 10*3/uL (ref 0.0–0.1)
Basophils Relative: 0.6 % (ref 0.0–3.0)
Eosinophils Absolute: 0.1 10*3/uL (ref 0.0–0.7)
Eosinophils Relative: 1 % (ref 0.0–5.0)
HCT: 43.2 % (ref 36.0–46.0)
Hemoglobin: 14.6 g/dL (ref 12.0–15.0)
Lymphocytes Relative: 33.7 % (ref 12.0–46.0)
Lymphs Abs: 3 10*3/uL (ref 0.7–4.0)
MCHC: 33.9 g/dL (ref 30.0–36.0)
MCV: 85.2 fl (ref 78.0–100.0)
Monocytes Absolute: 0.7 10*3/uL (ref 0.1–1.0)
Monocytes Relative: 7.4 % (ref 3.0–12.0)
Neutro Abs: 5.2 10*3/uL (ref 1.4–7.7)
Neutrophils Relative %: 57.3 % (ref 43.0–77.0)
Platelets: 242 10*3/uL (ref 150.0–400.0)
RBC: 5.07 Mil/uL (ref 3.87–5.11)
RDW: 14 % (ref 11.5–14.6)
WBC: 9 10*3/uL (ref 4.5–10.5)

## 2011-03-20 LAB — BASIC METABOLIC PANEL
BUN: 20 mg/dL (ref 6–23)
CO2: 31 mEq/L (ref 19–32)
Calcium: 9.1 mg/dL (ref 8.4–10.5)
Chloride: 100 mEq/L (ref 96–112)
Creatinine, Ser: 0.9 mg/dL (ref 0.4–1.2)
GFR: 71.4 mL/min (ref >60.00)
Glucose, Bld: 94 mg/dL (ref 70–99)
Potassium: 3.8 mEq/L (ref 3.5–5.1)
Sodium: 139 mEq/L (ref 135–145)

## 2011-03-20 LAB — URINALYSIS
Bilirubin Urine: NEGATIVE
Ketones, ur: NEGATIVE
Leukocytes, UA: NEGATIVE
Nitrite: NEGATIVE
Specific Gravity, Urine: >=1.03 (ref 1.000–1.030)
Total Protein, Urine: NEGATIVE
Urine Glucose: NEGATIVE
Urobilinogen, UA: 0.2 (ref 0.0–1.0)
pH: 6 (ref 5.0–8.0)

## 2011-03-20 LAB — HEPATIC FUNCTION PANEL
ALT: 13 U/L (ref 0–35)
AST: 19 U/L (ref 0–37)
Albumin: 4.1 g/dL (ref 3.5–5.2)
Alkaline Phosphatase: 65 U/L (ref 39–117)
Bilirubin, Direct: 0.1 mg/dL (ref 0.0–0.3)
Total Bilirubin: 0.9 mg/dL (ref 0.3–1.2)
Total Protein: 7.6 g/dL (ref 6.0–8.3)

## 2011-03-20 LAB — TSH: TSH: 1.6 u[IU]/mL (ref 0.35–5.50)

## 2011-03-20 LAB — LDL CHOLESTEROL, DIRECT: Direct LDL: 163.4 mg/dL

## 2011-03-26 ENCOUNTER — Encounter: Payer: Self-pay | Admitting: Internal Medicine

## 2011-03-27 ENCOUNTER — Ambulatory Visit (INDEPENDENT_AMBULATORY_CARE_PROVIDER_SITE_OTHER): Payer: 59 | Admitting: Internal Medicine

## 2011-03-27 ENCOUNTER — Encounter: Payer: Self-pay | Admitting: Internal Medicine

## 2011-03-27 VITALS — BP 160/98 | HR 75 | Temp 97.9°F | Wt 195.0 lb

## 2011-03-27 DIAGNOSIS — Z136 Encounter for screening for cardiovascular disorders: Secondary | ICD-10-CM

## 2011-03-27 DIAGNOSIS — E785 Hyperlipidemia, unspecified: Secondary | ICD-10-CM

## 2011-03-27 DIAGNOSIS — I6350 Cerebral infarction due to unspecified occlusion or stenosis of unspecified cerebral artery: Secondary | ICD-10-CM

## 2011-03-27 DIAGNOSIS — Z23 Encounter for immunization: Secondary | ICD-10-CM

## 2011-03-27 DIAGNOSIS — Z0000 Encounter for general adult medical examination without abnormal findings: Secondary | ICD-10-CM

## 2011-03-27 DIAGNOSIS — M719 Bursopathy, unspecified: Secondary | ICD-10-CM

## 2011-03-27 DIAGNOSIS — M67919 Unspecified disorder of synovium and tendon, unspecified shoulder: Secondary | ICD-10-CM

## 2011-03-27 DIAGNOSIS — I10 Essential (primary) hypertension: Secondary | ICD-10-CM

## 2011-03-27 MED ORDER — MOEXIPRIL HCL 15 MG PO TABS: 15.0000 mg | ORAL_TABLET | Freq: Two times a day (BID) | ORAL | Status: DC

## 2011-03-27 MED ORDER — ZOSTER VACCINE LIVE 19400 UNT/0.65ML ~~LOC~~ SOLR: 0.6500 mL | Freq: Once | SUBCUTANEOUS | Status: DC

## 2011-03-27 MED ORDER — HYDROCHLOROTHIAZIDE 25 MG PO TABS: 25.0000 mg | ORAL_TABLET | Freq: Every day | ORAL | Status: DC
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
## 2011-03-31 NOTE — Assessment & Plan Note (Signed)
BP Readings from Last 3 Encounters:  03/27/11 160/98  11/08/09 152/84  10/02/06 150/81   Poor control on present medication. She does report that her BP is much better controlled when she checks outside of clinic  Plan - continue present medications           Monitor BP at home and report back: if SBP 140+ consistently will need to adjust medications.

## 2011-03-31 NOTE — Assessment & Plan Note (Signed)
LDL 163+. She is advised as to the need to reduce the risk of ASVD in the setting of a previous stroke. The process of plaque build-up was discussed. "STATIN" therapy is recommended but she has had adverse effects in the past and is unwilling to try alternative therapy, e.g. Fenofibrates, etc.   Plan - life-style mgt including exercise and low fat diet strongly recommended           "Natural" alternative such as Red Yeast Rice was also suggested.

## 2011-03-31 NOTE — Assessment & Plan Note (Signed)
Interval history is negative. Physical exam normal except for weight. Gyn and breast exam done by Gyn. She is current for colorectal cancer screening with colonoscopy in Nov. '11. She declines mammogram scheduling. Immunizations: accepted shingles vaccine today but declines pneumonia vaccine and tetanus at this time preferring to return. 12 Lead EKG is w/o evidence of ischemia or injury.  In summary - an interesting woman who is encouraged to work on better health: low fat diet, exercise, weight loss, use of daily aspirin, consideration of treatment of hyperlipidemia. She will return for completion of immunizations at her discretion.

## 2011-03-31 NOTE — Assessment & Plan Note (Signed)
Patient has had ortho evaluation with surgery recommended. She chooses to defer surgery.  Plan - range of motion exercise daily           Avoid over use           Reconsider surgery if she develops progressive pain or limitation in movement.

## 2011-03-31 NOTE — Assessment & Plan Note (Signed)
No significant sequelae. Did discuss the importance of risk reduction- see other problems   Plan - she is encouraged to take ASA 81 mg daily as part of stroke prevention

## 2011-04-13 LAB — CARDIAC PANEL(CRET KIN+CKTOT+MB+TROPI)
CK, MB: 3.2
Relative Index: 2.7 — ABNORMAL HIGH
Total CK: 118
Total CK: 128
Troponin I: 0.01
Troponin I: 0.03

## 2011-04-13 LAB — URINE MICROSCOPIC-ADD ON

## 2011-04-13 LAB — CK TOTAL AND CKMB (NOT AT ARMC)
CK, MB: 3.4
Relative Index: 2.6 — ABNORMAL HIGH
Total CK: 133

## 2011-04-13 LAB — LIPID PANEL
Cholesterol: 241 — ABNORMAL HIGH
HDL: 35 — ABNORMAL LOW

## 2011-04-13 LAB — POCT I-STAT, CHEM 8
BUN: 16
Calcium, Ion: 1.07 — ABNORMAL LOW
Chloride: 103
Creatinine, Ser: 1.1
Glucose, Bld: 98
HCT: 47 — ABNORMAL HIGH
Hemoglobin: 16 — ABNORMAL HIGH
Potassium: 3.7
Sodium: 138
TCO2: 29

## 2011-04-13 LAB — URINALYSIS, ROUTINE W REFLEX MICROSCOPIC
Bilirubin Urine: NEGATIVE
Nitrite: NEGATIVE
Specific Gravity, Urine: 1.017
Urobilinogen, UA: 0.2
pH: 5

## 2011-04-13 LAB — TROPONIN I: Troponin I: 0.02

## 2011-04-13 LAB — TSH: TSH: 1.766

## 2011-04-13 LAB — POCT CARDIAC MARKERS
Operator id: 294521
Troponin i, poc: <0.05

## 2011-07-31 ENCOUNTER — Other Ambulatory Visit: Payer: Self-pay | Admitting: Internal Medicine

## 2011-07-31 NOTE — Telephone Encounter (Signed)
Refill request hydrochlorithiazide 25mg  & univasc 15mg .

## 2012-01-29 ENCOUNTER — Other Ambulatory Visit: Payer: Self-pay | Admitting: Internal Medicine

## 2012-05-10 ENCOUNTER — Other Ambulatory Visit: Payer: Self-pay | Admitting: Internal Medicine

## 2012-05-17 ENCOUNTER — Other Ambulatory Visit: Payer: Self-pay | Admitting: Internal Medicine

## 2012-05-19 ENCOUNTER — Other Ambulatory Visit: Payer: Self-pay | Admitting: Internal Medicine

## 2012-05-20 ENCOUNTER — Other Ambulatory Visit: Payer: Self-pay | Admitting: Internal Medicine

## 2012-07-29 ENCOUNTER — Other Ambulatory Visit: Payer: Self-pay | Admitting: Internal Medicine

## 2012-08-01 ENCOUNTER — Other Ambulatory Visit: Payer: Self-pay | Admitting: Internal Medicine

## 2012-08-02 ENCOUNTER — Other Ambulatory Visit: Payer: Self-pay | Admitting: Internal Medicine

## 2012-08-04 ENCOUNTER — Other Ambulatory Visit: Payer: Self-pay | Admitting: *Deleted

## 2012-08-04 MED ORDER — MOEXIPRIL HCL 15 MG PO TABS: 15.0000 mg | ORAL_TABLET | Freq: Every day | ORAL | Status: DC

## 2012-08-04 NOTE — Telephone Encounter (Signed)
Last office visit 03/27/11.

## 2012-08-05 ENCOUNTER — Other Ambulatory Visit: Payer: Self-pay | Admitting: *Deleted

## 2012-08-05 MED ORDER — MOEXIPRIL HCL 15 MG PO TABS: ORAL_TABLET | ORAL | Status: DC

## 2012-08-20 ENCOUNTER — Other Ambulatory Visit: Payer: Self-pay | Admitting: Internal Medicine

## 2012-08-21 ENCOUNTER — Other Ambulatory Visit: Payer: Self-pay | Admitting: Internal Medicine

## 2012-08-21 ENCOUNTER — Other Ambulatory Visit: Payer: Self-pay | Admitting: *Deleted

## 2012-08-21 MED ORDER — MOEXIPRIL HCL 15 MG PO TABS: ORAL_TABLET | ORAL | Status: DC

## 2012-08-22 ENCOUNTER — Other Ambulatory Visit: Payer: Self-pay | Admitting: *Deleted

## 2012-08-22 MED ORDER — MOEXIPRIL HCL 15 MG PO TABS: ORAL_TABLET | ORAL | Status: DC

## 2012-08-22 NOTE — Telephone Encounter (Signed)
Left message on the answering machine at pharmacy that this was refilled on 08/05/12. This call was to be sure rx was filled. Call back number left.

## 2012-11-03 ENCOUNTER — Telehealth: Payer: Self-pay | Admitting: Internal Medicine

## 2012-11-03 NOTE — Telephone Encounter (Signed)
Patients insurance has changed and she needs to have a new Blood pressure medication called in, she is requesting to have enalapril called in to her pharmacy

## 2012-11-04 MED ORDER — ENALAPRIL MALEATE 10 MG PO TABS: 10.0000 mg | ORAL_TABLET | Freq: Two times a day (BID) | ORAL | Status: DC

## 2012-11-04 NOTE — Telephone Encounter (Signed)
Patient informed. 

## 2012-11-04 NOTE — Telephone Encounter (Signed)
Please advise 

## 2012-11-04 NOTE — Telephone Encounter (Signed)
Done erx 

## 2013-02-27 ENCOUNTER — Other Ambulatory Visit: Payer: Self-pay | Admitting: Internal Medicine

## 2013-09-07 ENCOUNTER — Telehealth: Payer: Self-pay | Admitting: Internal Medicine

## 2013-09-07 ENCOUNTER — Encounter (HOSPITAL_COMMUNITY): Payer: Self-pay | Admitting: Emergency Medicine

## 2013-09-07 ENCOUNTER — Emergency Department (HOSPITAL_COMMUNITY)
Admission: EM | Admit: 2013-09-07 | Discharge: 2013-09-07 | Disposition: A | Discharge status: Home / Self Care | Payer: Medicare HMO | Attending: Emergency Medicine | Admitting: Emergency Medicine

## 2013-09-07 DIAGNOSIS — Z8669 Personal history of other diseases of the nervous system and sense organs: Secondary | ICD-10-CM | POA: Insufficient documentation

## 2013-09-07 DIAGNOSIS — E860 Dehydration: Secondary | ICD-10-CM

## 2013-09-07 DIAGNOSIS — R112 Nausea with vomiting, unspecified: Secondary | ICD-10-CM | POA: Insufficient documentation

## 2013-09-07 DIAGNOSIS — R5383 Other fatigue: Secondary | ICD-10-CM

## 2013-09-07 DIAGNOSIS — Z79899 Other long term (current) drug therapy: Secondary | ICD-10-CM | POA: Insufficient documentation

## 2013-09-07 DIAGNOSIS — I10 Essential (primary) hypertension: Secondary | ICD-10-CM | POA: Insufficient documentation

## 2013-09-07 DIAGNOSIS — R5381 Other malaise: Secondary | ICD-10-CM | POA: Insufficient documentation

## 2013-09-07 DIAGNOSIS — M542 Cervicalgia: Secondary | ICD-10-CM | POA: Insufficient documentation

## 2013-09-07 LAB — COMPREHENSIVE METABOLIC PANEL
ALK PHOS: 77 U/L (ref 39–117)
ALT: 10 U/L (ref 0–35)
AST: 17 U/L (ref 0–37)
Albumin: 3.9 g/dL (ref 3.5–5.2)
BUN: 19 mg/dL (ref 6–23)
CO2: 27 meq/L (ref 19–32)
Calcium: 9.7 mg/dL (ref 8.4–10.5)
Chloride: 102 mEq/L (ref 96–112)
Creatinine, Ser: 0.89 mg/dL (ref 0.50–1.10)
GFR calc non Af Amer: 66 mL/min — ABNORMAL LOW (ref >90)
GFR, EST AFRICAN AMERICAN: 76 mL/min — AB (ref >90)
GLUCOSE: 116 mg/dL — AB (ref 70–99)
POTASSIUM: 3.6 meq/L — AB (ref 3.7–5.3)
SODIUM: 145 meq/L (ref 137–147)
TOTAL PROTEIN: 8.2 g/dL (ref 6.0–8.3)
Total Bilirubin: 0.5 mg/dL (ref 0.3–1.2)

## 2013-09-07 LAB — URINE MICROSCOPIC-ADD ON

## 2013-09-07 LAB — CBC WITH DIFFERENTIAL/PLATELET
Basophils Absolute: 0 10*3/uL (ref 0.0–0.1)
Basophils Relative: 0 % (ref 0–1)
EOS ABS: 0 10*3/uL (ref 0.0–0.7)
Eosinophils Relative: 0 % (ref 0–5)
HCT: 45.1 % (ref 36.0–46.0)
Hemoglobin: 15.4 g/dL — ABNORMAL HIGH (ref 12.0–15.0)
LYMPHS ABS: 2.4 10*3/uL (ref 0.7–4.0)
LYMPHS PCT: 20 % (ref 12–46)
MCH: 29.6 pg (ref 26.0–34.0)
MCHC: 34.1 g/dL (ref 30.0–36.0)
MCV: 86.7 fL (ref 78.0–100.0)
Monocytes Absolute: 0.8 10*3/uL (ref 0.1–1.0)
Monocytes Relative: 7 % (ref 3–12)
NEUTROS PCT: 73 % (ref 43–77)
Neutro Abs: 8.8 10*3/uL — ABNORMAL HIGH (ref 1.7–7.7)
PLATELETS: 234 10*3/uL (ref 150–400)
RBC: 5.2 MIL/uL — AB (ref 3.87–5.11)
RDW: 14.1 % (ref 11.5–15.5)
WBC: 12 10*3/uL — AB (ref 4.0–10.5)

## 2013-09-07 LAB — URINALYSIS, ROUTINE W REFLEX MICROSCOPIC
BILIRUBIN URINE: NEGATIVE
GLUCOSE, UA: NEGATIVE mg/dL
Ketones, ur: NEGATIVE mg/dL
Leukocytes, UA: NEGATIVE
Nitrite: NEGATIVE
Protein, ur: NEGATIVE mg/dL
SPECIFIC GRAVITY, URINE: 1.024 (ref 1.005–1.030)
UROBILINOGEN UA: 0.2 mg/dL (ref 0.0–1.0)
pH: 5 (ref 5.0–8.0)

## 2013-09-07 LAB — I-STAT TROPONIN, ED: Troponin i, poc: 0 ng/mL (ref 0.00–0.08)

## 2013-09-07 LAB — LIPASE, BLOOD: Lipase: 67 U/L — ABNORMAL HIGH (ref 11–59)

## 2013-09-07 MED ORDER — SODIUM CHLORIDE 0.9 % IV BOLUS (SEPSIS)
1000.0000 mL | Freq: Once | INTRAVENOUS | Status: AC
  Administered 2013-09-07: 1000 mL via INTRAVENOUS

## 2013-09-07 NOTE — Discharge Instructions (Signed)
Dehydration, Adult  Dehydration means your body does not have as much fluid as it needs. Your kidneys, brain, and heart will not work properly without the right amount of fluids and salt.   HOME CARE   Ask your doctor how to replace body fluid losses (rehydrate).   Drink enough fluids to keep your pee (urine) clear or pale yellow.   Drink small amounts of fluids often if you feel sick to your stomach (nauseous) or throw up (vomit).   Eat like you normally do.   Avoid:   Foods or drinks high in sugar.   Bubbly (carbonated) drinks.   Juice.   Very hot or cold fluids.   Drinks with caffeine.   Fatty, greasy foods.   Alcohol.   Tobacco.   Eating too much.   Gelatin desserts.   Wash your hands to avoid spreading germs (bacteria, viruses).   Only take medicine as told by your doctor.   Keep all doctor visits as told.  GET HELP RIGHT AWAY IF:    You cannot drink something without throwing up.   You get worse even with treatment.   Your vomit has blood in it or looks greenish.   Your poop (stool) has blood in it or looks black and tarry.   You have not peed in 6 to 8 hours.   You pee a small amount of very dark pee.   You have a fever.   You pass out (faint).   You have belly (abdominal) pain that gets worse or stays in one spot (localizes).   You have a rash, stiff neck, or bad headache.   You get easily annoyed, sleepy, or are hard to wake up.   You feel weak, dizzy, or very thirsty.  MAKE SURE YOU:    Understand these instructions.   Will watch your condition.   Will get help right away if you are not doing well or get worse.  Document Released: 04/28/2009 Document Revised: 09/24/2011 Document Reviewed: 02/19/2011  ExitCare Patient Information 2014 [employer].

## 2013-09-07 NOTE — Telephone Encounter (Signed)
Patient Information:  Caller Name: [place]  Phone: [phone]  Patient: [name], [name]  Gender: Female  DOB: 06-28-1946  Age: 68 Years  PCP: Adella Hare (Adults only)  Office Follow Up:  Does the office need to follow up with this patient?: No  Instructions For The Office: N/A  RN Note:  Pt. husband is at home and he will drive her to the ED. Tried to phone the office and let them know but did not get an answer. Advised pt. to go to [facility].  Symptoms  Reason For Call & Symptoms: Started with nausea and severe vomiting on 09/05/13. On 09/06/13  was very weak. States legs are wobbly. Somewhat SOB on exertion. No chest pain. Pt has been urinating.  Reviewed Health History In EMR: Yes  Reviewed Medications In EMR: Yes  Reviewed Allergies In EMR: Yes  Reviewed Surgeries / Procedures: Yes  Date of Onset of Symptoms: 09/04/2013  Guideline(s) Used:  Weakness (Generalized) and Fatigue  Disposition Per Guideline:   Call EMS 911 Now  Reason For Disposition Reached:   Severe weakness (i.e., unable to walk or barely able to walk, requires support) and new onset or worsening  Advice Given:  Call Back If:  Unable to stand or walk  Passes out  Breathing difficulty occurs  You become worse.  Patient Will Follow Care Advice:  YES

## 2013-09-07 NOTE — ED Provider Notes (Signed)
CSN: [id number]     Arrival date & time 09/07/13  1411 History  First MD Initiated Contact with Patient 09/07/13 1947     Chief Complaint  Patient presents with  . Nausea  . Emesis   The history is provided by the patient.  Pt started having trouble with nausea and vomiting on Saturday.  She continued to vomit several times.  She stopped vomiting on Sunday but has continued to feel very weak and nauseated.    She was able to eat a little yesterday but has not eaten anything today.  No chest pain or abdominal pain.  She did have a soreness in her left neck and collar bone.  No headache.  No trouble with speech.  It gets worse when she tries to move around.  Past Medical History  Diagnosis Date  . Backache, unspecified   . Hemiplegia, unspecified, affecting unspecified side   . Other and unspecified hyperlipidemia   . Lacunar infarction   . Rotator cuff injury     right shoulder  . Retinal defect, unspecified   . Venous tributary (branch) occlusion of retina   . Unspecified essential hypertension    Past Surgical History  Procedure Laterality Date  . Cholecystectomy    . Sphincterotomy    . Abdominal hysterectomy     Family History  Problem Relation Age of Onset  . Heart disease Mother     died at age 21  . Hyperlipidemia Brother   . Hypertension Brother   . Diabetes Brother   . Heart disease Brother     CAD/MI/CABG  . Peripheral vascular disease Brother    History  Substance Use Topics  . Smoking status: Never Smoker   . Smokeless tobacco: Never Used  . Alcohol Use: No   OB History   Grav Para Term Preterm Abortions TAB SAB Ect Mult Living                 Review of Systems  Gastrointestinal: Negative for diarrhea and constipation.  All other systems reviewed and are negative.      Allergies  Amoxicillin  Home Medications   Current Outpatient Rx  Name  Route  Sig  Dispense  Refill  . enalapril (VASOTEC) 10 MG tablet   Oral   Take 10 mg by mouth  daily.         . hydrochlorothiazide (HYDRODIURIL) 25 MG tablet   Oral   Take 25 mg by mouth daily.          BP 145/70  Pulse 63  Temp(Src) 97.5 F (36.4 C) (Oral)  Resp 17  SpO2 97% Physical Exam  Nursing note and vitals reviewed. Constitutional: She appears well-developed and well-nourished. No distress.  HENT:  Head: Normocephalic and atraumatic.  Right Ear: External ear normal.  Left Ear: External ear normal.  Eyes: Conjunctivae are normal. Right eye exhibits no discharge. Left eye exhibits no discharge. No scleral icterus.  Neck: Neck supple. No tracheal deviation present.  Cardiovascular: Normal rate, regular rhythm and intact distal pulses.   Pulmonary/Chest: Effort normal and breath sounds normal. No stridor. No respiratory distress. She has no wheezes. She has no rales.  Abdominal: Soft. Bowel sounds are normal. She exhibits no distension. There is no tenderness. There is no rebound and no guarding.  Musculoskeletal: She exhibits no edema and no tenderness.  Neurological: She is alert. She has normal strength. No cranial nerve deficit (no facial droop, extraocular movements intact, no slurred speech) or sensory  deficit. She exhibits normal muscle tone. She displays no seizure activity. Coordination normal.  Skin: Skin is warm and dry. No rash noted.  Psychiatric: She has a normal mood and affect.    ED Course  Procedures (including critical care time) Labs Review Labs Reviewed  CBC WITH DIFFERENTIAL - Abnormal; Notable for the following:    WBC 12.0 (*)    RBC 5.20 (*)    Hemoglobin 15.4 (*)    Neutro Abs 8.8 (*)    All other components within normal limits  COMPREHENSIVE METABOLIC PANEL - Abnormal; Notable for the following:    Potassium 3.6 (*)    Glucose, Bld 116 (*)    GFR calc non Af Amer 66 (*)    GFR calc Af Amer 76 (*)    All other components within normal limits  LIPASE, BLOOD - Abnormal; Notable for the following:    Lipase 67 (*)    All other  components within normal limits  URINALYSIS, ROUTINE W REFLEX MICROSCOPIC - Abnormal; Notable for the following:    APPearance CLOUDY (*)    Hgb urine dipstick SMALL (*)    All other components within normal limits  URINE MICROSCOPIC-ADD ON - Abnormal; Notable for the following:    Squamous Epithelial / LPF FEW (*)    Bacteria, UA MANY (*)    Casts HYALINE CASTS (*)    All other components within normal limits  I-STAT TROPOININ, ED    EKG Normal sinus rhythm rate 60, normal axis, normal intervals Nonspecific T wave abnormalities lateral leads No prior EKG for comparison Medications  sodium chloride 0.9 % bolus 1,000 mL (1,000 mLs Intravenous New Bag/Given 09/07/13 2023)    MDM   Final diagnoses:  Nausea and vomiting  Dehydration    Pt has been able to walk around the ED.   No hypoxia.  No trouble with her gait.  Pt may be feeling fatigued from her recent illness.  At this time there does not appear to be any evidence of an acute emergency medical condition and the patient appears stable for discharge with appropriate outpatient follow up.     Kathalene Frames, MD 09/07/13 [phone]

## 2013-09-07 NOTE — ED Notes (Signed)
MD at bedside. 

## 2013-09-07 NOTE — ED Notes (Signed)
Pt reports that she has had n/v since Friday. Also reports some generalized fatigue with the vomiting. Reports that she was able to eat something yesterday. Denies any pain at this time.

## 2013-09-08 LAB — URINE CULTURE: Colony Count: 5000

## 2013-10-27 ENCOUNTER — Ambulatory Visit (INDEPENDENT_AMBULATORY_CARE_PROVIDER_SITE_OTHER): Payer: Medicare HMO | Admitting: Family Medicine

## 2013-10-27 ENCOUNTER — Encounter: Payer: Self-pay | Admitting: Family Medicine

## 2013-10-27 VITALS — BP 160/90 | HR 77 | Temp 97.9°F | Ht 62.0 in | Wt 188.0 lb

## 2013-10-27 DIAGNOSIS — I10 Essential (primary) hypertension: Secondary | ICD-10-CM

## 2013-10-27 DIAGNOSIS — M549 Dorsalgia, unspecified: Secondary | ICD-10-CM

## 2013-10-27 DIAGNOSIS — R5381 Other malaise: Secondary | ICD-10-CM

## 2013-10-27 DIAGNOSIS — R739 Hyperglycemia, unspecified: Secondary | ICD-10-CM

## 2013-10-27 DIAGNOSIS — R531 Weakness: Secondary | ICD-10-CM

## 2013-10-27 DIAGNOSIS — I6350 Cerebral infarction due to unspecified occlusion or stenosis of unspecified cerebral artery: Secondary | ICD-10-CM

## 2013-10-27 DIAGNOSIS — R7309 Other abnormal glucose: Secondary | ICD-10-CM

## 2013-10-27 DIAGNOSIS — R5383 Other fatigue: Secondary | ICD-10-CM

## 2013-10-27 LAB — LIPID PANEL
CHOL/HDL RATIO: 4
Cholesterol: 221 mg/dL — ABNORMAL HIGH (ref 0–200)
HDL: 52.3 mg/dL (ref >39.00)
LDL Cholesterol: 131 mg/dL — ABNORMAL HIGH (ref 0–99)
Triglycerides: 189 mg/dL — ABNORMAL HIGH (ref 0.0–149.0)
VLDL: 37.8 mg/dL (ref 0.0–40.0)

## 2013-10-27 LAB — CBC WITH DIFFERENTIAL/PLATELET
BASOS PCT: 0.5 % (ref 0.0–3.0)
Basophils Absolute: 0 10*3/uL (ref 0.0–0.1)
EOS PCT: 1.2 % (ref 0.0–5.0)
Eosinophils Absolute: 0.1 10*3/uL (ref 0.0–0.7)
HEMATOCRIT: 42.4 % (ref 36.0–46.0)
Hemoglobin: 14.1 g/dL (ref 12.0–15.0)
LYMPHS ABS: 2.9 10*3/uL (ref 0.7–4.0)
Lymphocytes Relative: 39.7 % (ref 12.0–46.0)
MCHC: 33.3 g/dL (ref 30.0–36.0)
MCV: 86.1 fl (ref 78.0–100.0)
MONO ABS: 0.6 10*3/uL (ref 0.1–1.0)
Monocytes Relative: 7.7 % (ref 3.0–12.0)
NEUTROS ABS: 3.7 10*3/uL (ref 1.4–7.7)
Neutrophils Relative %: 50.9 % (ref 43.0–77.0)
Platelets: 217 10*3/uL (ref 150.0–400.0)
RBC: 4.92 Mil/uL (ref 3.87–5.11)
RDW: 15 % — ABNORMAL HIGH (ref 11.5–14.6)
WBC: 7.3 10*3/uL (ref 4.5–10.5)

## 2013-10-27 LAB — BASIC METABOLIC PANEL
BUN: 18 mg/dL (ref 6–23)
CALCIUM: 9.4 mg/dL (ref 8.4–10.5)
CO2: 28 meq/L (ref 19–32)
Chloride: 102 mEq/L (ref 96–112)
Creatinine, Ser: 0.8 mg/dL (ref 0.4–1.2)
GFR: 79.39 mL/min (ref >60.00)
Glucose, Bld: 84 mg/dL (ref 70–99)
POTASSIUM: 3.5 meq/L (ref 3.5–5.1)
SODIUM: 140 meq/L (ref 135–145)

## 2013-10-27 LAB — HEPATIC FUNCTION PANEL
ALT: 14 U/L (ref 0–35)
AST: 20 U/L (ref 0–37)
Albumin: 4 g/dL (ref 3.5–5.2)
Alkaline Phosphatase: 63 U/L (ref 39–117)
BILIRUBIN DIRECT: 0.1 mg/dL (ref 0.0–0.3)
TOTAL PROTEIN: 7.5 g/dL (ref 6.0–8.3)
Total Bilirubin: 0.7 mg/dL (ref 0.3–1.2)

## 2013-10-27 LAB — POCT URINALYSIS DIPSTICK
BILIRUBIN UA: NEGATIVE
Glucose, UA: NEGATIVE
Ketones, UA: NEGATIVE
Leukocytes, UA: NEGATIVE
Nitrite, UA: NEGATIVE
Protein, UA: NEGATIVE
SPEC GRAV UA: 1.025
Urobilinogen, UA: 0.2
pH, UA: 5

## 2013-10-27 LAB — TSH: TSH: 2.24 u[IU]/mL (ref 0.35–5.50)

## 2013-10-27 LAB — HEMOGLOBIN A1C: Hgb A1c MFr Bld: 5.7 % (ref 4.6–6.5)

## 2013-10-27 LAB — VITAMIN B12: Vitamin B-12: 276 pg/mL (ref 211–911)

## 2013-10-27 NOTE — Progress Notes (Signed)
   Subjective:    Patient ID: [name], female    DOB: [birth year], 68 y.o.   MRN: [id number]  HPI 68 yr old female to establish with [place] after transferring from Dr. Linda Hedges. She asks me to find out why she feels so weak and tired all the time. This started in February when she caught a GI virus which gave her nausea for about 48 hours. She did a lot of vomiting then and she saw the ER on 09-07-13. Her labs then showed a WBC of 12.0 and a low potassium. She was given IV fluids and electrolytes, and the nausea resolved quickly. No pain or fever. Since then her appetite has been normal and she is eating a normal diet. She drinks plenty of fluids. However she has been weak all over since then.    Review of Systems  Constitutional: Positive for fatigue. Negative for fever, activity change, appetite change and unexpected weight change.  Respiratory: Negative.   Cardiovascular: Negative.   Gastrointestinal: Negative.   Genitourinary: Negative.   Neurological: Negative.        Objective:   Physical Exam  Constitutional: She is oriented to person, place, and time. She appears well-developed and well-nourished. No distress.  Cardiovascular: Normal rate, regular rhythm, normal heart sounds and intact distal pulses.   Pulmonary/Chest: Effort normal and breath sounds normal.  Abdominal: Soft. Bowel sounds are normal. She exhibits no distension and no mass. There is no tenderness. There is no rebound and no guarding.  Lymphadenopathy:    She has no cervical adenopathy.  Neurological: She is alert and oriented to person, place, and time.          Assessment & Plan:  It is unclear what the cause of this fatigue may be. We will get labs today to investigate.

## 2013-10-27 NOTE — Progress Notes (Signed)
Pre visit review using our clinic review tool, if applicable. No additional management support is needed unless otherwise documented below in the visit note. 

## 2013-10-28 ENCOUNTER — Telehealth: Payer: Self-pay | Admitting: Family Medicine

## 2013-10-28 NOTE — Telephone Encounter (Signed)
Relevant patient education mailed to patient.  

## 2013-12-17 ENCOUNTER — Encounter: Payer: Self-pay | Admitting: Family Medicine

## 2013-12-18 ENCOUNTER — Telehealth: Payer: Self-pay | Admitting: Family Medicine

## 2013-12-18 MED ORDER — HYDROCHLOROTHIAZIDE 25 MG PO TABS: 25.0000 mg | ORAL_TABLET | Freq: Every day | ORAL | Status: DC

## 2013-12-18 NOTE — Telephone Encounter (Signed)
[id number] - [place], [place] [address] AT [address] is requesting re-fill on hydrochlorothiazide (HYDRODIURIL) 25 MG tablet

## 2013-12-18 NOTE — Telephone Encounter (Signed)
I sent script e-scribe. 

## 2014-01-18 ENCOUNTER — Other Ambulatory Visit: Payer: Self-pay | Admitting: Family Medicine

## 2014-02-24 ENCOUNTER — Telehealth: Payer: Self-pay

## 2014-02-24 NOTE — Telephone Encounter (Signed)
[name] from [facility] called to reference enalapril (VASOTEC) 10 MG tablet refill history.  Attempted to reach pt without any luck. Last refill 10/30/2013 for a 3 month supply and pt should have requested another refill in July. This was a courtsey call to make PCP aware.

## 2014-02-25 NOTE — Telephone Encounter (Signed)
I left a voice message for pt to call us back or notify the pharmacy, if and when she needs the refill.

## 2014-04-19 ENCOUNTER — Other Ambulatory Visit: Payer: Self-pay | Admitting: Family Medicine

## 2014-04-20 ENCOUNTER — Other Ambulatory Visit: Payer: Self-pay | Admitting: Family Medicine

## 2014-04-20 NOTE — Telephone Encounter (Signed)
Can we refill this? 

## 2014-07-18 ENCOUNTER — Encounter: Payer: Self-pay | Admitting: Family Medicine

## 2014-07-19 ENCOUNTER — Other Ambulatory Visit: Payer: Self-pay | Admitting: Family Medicine

## 2014-07-19 MED ORDER — HYDROCHLOROTHIAZIDE 25 MG PO TABS: 25.0000 mg | ORAL_TABLET | Freq: Every day | ORAL | Status: DC

## 2014-12-17 ENCOUNTER — Ambulatory Visit (INDEPENDENT_AMBULATORY_CARE_PROVIDER_SITE_OTHER): Payer: Medicare HMO | Admitting: Family Medicine

## 2014-12-17 ENCOUNTER — Ambulatory Visit (INDEPENDENT_AMBULATORY_CARE_PROVIDER_SITE_OTHER)
Admission: RE | Admit: 2014-12-17 | Discharge: 2014-12-17 | Disposition: A | Discharge status: Home / Self Care | Payer: Medicare HMO | Source: Ambulatory Visit | Attending: Family Medicine | Admitting: Family Medicine

## 2014-12-17 ENCOUNTER — Encounter: Payer: Self-pay | Admitting: Family Medicine

## 2014-12-17 VITALS — BP 143/82 | HR 96 | Temp 98.1°F | Ht 62.0 in | Wt 189.0 lb

## 2014-12-17 DIAGNOSIS — S63619A Unspecified sprain of unspecified finger, initial encounter: Secondary | ICD-10-CM

## 2014-12-17 DIAGNOSIS — S63502A Unspecified sprain of left wrist, initial encounter: Secondary | ICD-10-CM

## 2014-12-17 NOTE — Progress Notes (Signed)
   Subjective:    Patient ID: Read Drivers, female    DOB: 02-Jul-1946, 69 y.o.   MRN: [id number]  HPI Here to check injuries to the left wrist and hand which occurred at home yesterday. She was trying to put a ladder away when it fell, twisting her left hand. She now has pain in the left wrist and 5th finger. She applied ice last night but she has not taken any meds for it.    Review of Systems  Constitutional: Negative.   Musculoskeletal: Positive for joint swelling and arthralgias.       Objective:   Physical Exam  Constitutional: She appears well-developed and well-nourished. No distress.  Musculoskeletal:  The left wrist is swollen and is tender along the ulnar side. Full ROM. The left 5th finger is tender but not swollen, its ROM is full           Assessment & Plan:  The finger appears to be sprained. We need to rule out a fracture to the wrist. Sent for Xrays of both. She was fitted with a wrist and forearm splint. Use Motrin prn and apply ice.

## 2015-01-10 ENCOUNTER — Other Ambulatory Visit: Payer: Self-pay

## 2015-01-18 ENCOUNTER — Other Ambulatory Visit: Payer: Self-pay | Admitting: Family Medicine

## 2015-03-16 ENCOUNTER — Encounter: Payer: Self-pay | Admitting: Family Medicine

## 2015-03-16 ENCOUNTER — Ambulatory Visit (INDEPENDENT_AMBULATORY_CARE_PROVIDER_SITE_OTHER): Payer: Medicare HMO | Admitting: Family Medicine

## 2015-03-16 VITALS — BP 159/92 | HR 98 | Temp 98.5°F | Ht 62.0 in | Wt 184.0 lb

## 2015-03-16 DIAGNOSIS — J019 Acute sinusitis, unspecified: Secondary | ICD-10-CM

## 2015-03-16 MED ORDER — AZITHROMYCIN 250 MG PO TABS: ORAL_TABLET | ORAL | Status: DC

## 2015-03-16 NOTE — Progress Notes (Signed)
   Subjective:    Patient ID: Read Drivers, female    DOB: May 26, 1946, 69 y.o.   MRN: [id number]  HPI Here for one week of PND, ST, and a dry cough. She had fever one day only.    Review of Systems  Constitutional: Positive for fever.  HENT: Positive for congestion, postnasal drip and sinus pressure.   Eyes: Negative.   Respiratory: Positive for cough.        Objective:   Physical Exam  Constitutional: She appears well-developed and well-nourished.  HENT:  Right Ear: External ear normal.  Left Ear: External ear normal.  Nose: Nose normal.  Mouth/Throat: Oropharynx is clear and moist.  Eyes: Conjunctivae are normal.  Neck: No thyromegaly present.  Pulmonary/Chest: Effort normal and breath sounds normal. No respiratory distress. She has no wheezes. She has no rales. She exhibits no tenderness.  Lymphadenopathy:    She has no cervical adenopathy.          Assessment & Plan:  Sinusitis, treat with Zpack. Add Mucinex prn

## 2015-03-16 NOTE — Progress Notes (Signed)
Pre visit review using our clinic review tool, if applicable. No additional management support is needed unless otherwise documented below in the visit note. 

## 2015-05-02 ENCOUNTER — Other Ambulatory Visit: Payer: Self-pay | Admitting: Family Medicine

## 2015-05-22 DIAGNOSIS — Z23 Encounter for immunization: Secondary | ICD-10-CM | POA: Diagnosis not present

## 2015-07-20 ENCOUNTER — Other Ambulatory Visit: Payer: Self-pay | Admitting: Family Medicine

## 2015-07-20 ENCOUNTER — Emergency Department: Admit: 2015-07-20 | Discharge: 2015-07-20 | Discharge status: Absent without leave | Payer: Medicare HMO

## 2015-08-18 ENCOUNTER — Emergency Department (HOSPITAL_COMMUNITY): Payer: Medicare HMO

## 2015-08-18 ENCOUNTER — Emergency Department (HOSPITAL_COMMUNITY)
Admission: EM | Admit: 2015-08-18 | Discharge: 2015-08-18 | Disposition: A | Discharge status: Home / Self Care | Payer: Medicare HMO | Attending: Emergency Medicine | Admitting: Emergency Medicine

## 2015-08-18 ENCOUNTER — Encounter (HOSPITAL_COMMUNITY): Payer: Self-pay | Admitting: *Deleted

## 2015-08-18 DIAGNOSIS — T18108A Unspecified foreign body in esophagus causing other injury, initial encounter: Secondary | ICD-10-CM | POA: Insufficient documentation

## 2015-08-18 DIAGNOSIS — K2289 Other specified disease of esophagus: Secondary | ICD-10-CM

## 2015-08-18 DIAGNOSIS — S1121XA Laceration without foreign body of pharynx and cervical esophagus, initial encounter: Secondary | ICD-10-CM | POA: Insufficient documentation

## 2015-08-18 DIAGNOSIS — I10 Essential (primary) hypertension: Secondary | ICD-10-CM | POA: Diagnosis not present

## 2015-08-18 DIAGNOSIS — R0789 Other chest pain: Secondary | ICD-10-CM | POA: Diagnosis not present

## 2015-08-18 DIAGNOSIS — R079 Chest pain, unspecified: Secondary | ICD-10-CM | POA: Diagnosis not present

## 2015-08-18 DIAGNOSIS — Y9389 Activity, other specified: Secondary | ICD-10-CM | POA: Insufficient documentation

## 2015-08-18 DIAGNOSIS — Y998 Other external cause status: Secondary | ICD-10-CM | POA: Insufficient documentation

## 2015-08-18 DIAGNOSIS — Z880 Allergy status to penicillin: Secondary | ICD-10-CM | POA: Diagnosis not present

## 2015-08-18 DIAGNOSIS — K228 Other specified diseases of esophagus: Secondary | ICD-10-CM | POA: Diagnosis not present

## 2015-08-18 DIAGNOSIS — X58XXXA Exposure to other specified factors, initial encounter: Secondary | ICD-10-CM | POA: Diagnosis not present

## 2015-08-18 DIAGNOSIS — Z79899 Other long term (current) drug therapy: Secondary | ICD-10-CM | POA: Insufficient documentation

## 2015-08-18 DIAGNOSIS — Y9289 Other specified places as the place of occurrence of the external cause: Secondary | ICD-10-CM | POA: Diagnosis not present

## 2015-08-18 LAB — BASIC METABOLIC PANEL
ANION GAP: 12 (ref 5–15)
BUN: 17 mg/dL (ref 6–20)
CALCIUM: 9.4 mg/dL (ref 8.9–10.3)
CO2: 28 mmol/L (ref 22–32)
CREATININE: 1.09 mg/dL — AB (ref 0.44–1.00)
Chloride: 100 mmol/L — ABNORMAL LOW (ref 101–111)
GFR calc Af Amer: 59 mL/min — ABNORMAL LOW (ref >60)
GFR calc non Af Amer: 51 mL/min — ABNORMAL LOW (ref >60)
Glucose, Bld: 107 mg/dL — ABNORMAL HIGH (ref 65–99)
POTASSIUM: 3.4 mmol/L — AB (ref 3.5–5.1)
SODIUM: 140 mmol/L (ref 135–145)

## 2015-08-18 LAB — CBC
HCT: 46.2 % — ABNORMAL HIGH (ref 36.0–46.0)
Hemoglobin: 15.4 g/dL — ABNORMAL HIGH (ref 12.0–15.0)
MCH: 28.7 pg (ref 26.0–34.0)
MCHC: 33.3 g/dL (ref 30.0–36.0)
MCV: 86.2 fL (ref 78.0–100.0)
Platelets: 228 10*3/uL (ref 150–400)
RBC: 5.36 MIL/uL — ABNORMAL HIGH (ref 3.87–5.11)
RDW: 14.2 % (ref 11.5–15.5)
WBC: 8.6 10*3/uL (ref 4.0–10.5)

## 2015-08-18 LAB — I-STAT TROPONIN, ED: TROPONIN I, POC: 0 ng/mL (ref 0.00–0.08)

## 2015-08-18 MED ORDER — RANITIDINE HCL 150 MG/10ML PO SYRP
300.0000 mg | ORAL_SOLUTION | Freq: Once | ORAL | Status: AC
  Administered 2015-08-18: 300 mg via ORAL
  Filled 2015-08-18 (×2): qty 20

## 2015-08-18 MED ORDER — SUCRALFATE 1 GM/10ML PO SUSP
1.0000 g | Freq: Once | ORAL | Status: AC
  Administered 2015-08-18: 1 g via ORAL
  Filled 2015-08-18: qty 10

## 2015-08-18 NOTE — ED Notes (Signed)
Patient transported to X-ray 

## 2015-08-18 NOTE — ED Notes (Signed)
Pt departed in NAD.  

## 2015-08-18 NOTE — ED Notes (Signed)
Patient transported to CT 

## 2015-08-18 NOTE — ED Notes (Signed)
Pt reports she was eating deviled crab on Friday night and it felt like she got some of the shell caught in her throat. It feels like the sensation has moved into the center of her chest and now moving further up in her chest. No difficulty swallowing or breathing at this time.

## 2015-08-18 NOTE — ED Provider Notes (Signed)
CSN: [id number]     Arrival date & time 08/18/15  0201 History   First MD Initiated Contact with Patient 08/18/15 0315     Chief Complaint  Patient presents with  . Chest Pain     (Consider location/radiation/quality/duration/timing/severity/associated sxs/prior Treatment) HPI  [name] is a 70 y.o. female with no significant past medical history presenting today with possible foreign body in her throat. Patient states several days ago she accidentally ate the shell of a crab. She felt it in her throat and is now moved down to her mid chest. She states she feels like there is a foreign body there. She has been able to eat without vomiting. She's had no change in her bowel movements. She has not taken anything for pain control.  Patient has no further complaints.  10 Systems reviewed and are negative for acute change except as noted in the HPI.      Past Medical History  Diagnosis Date  . Backache, unspecified   . Hemiplegia, unspecified, affecting unspecified side   . Other and unspecified hyperlipidemia   . Lacunar infarction ([place])   . Rotator cuff injury     right shoulder  . Retinal defect, unspecified   . Venous tributary (branch) occlusion of retina   . Unspecified essential hypertension    Past Surgical History  Procedure Laterality Date  . Cholecystectomy    . Sphincterotomy    . Abdominal hysterectomy     Family History  Problem Relation Age of Onset  . Heart disease Mother     died at age 30  . Hyperlipidemia Brother   . Hypertension Brother   . Diabetes Brother   . Heart disease Brother     CAD/MI/CABG  . Peripheral vascular disease Brother    Social History  Substance Use Topics  . Smoking status: Never Smoker   . Smokeless tobacco: Never Used  . Alcohol Use: No   OB History    No data available     Review of Systems    Allergies  Amoxicillin  Home Medications   Prior to Admission medications   Medication Sig Start Date End Date  Taking? Authorizing Provider  enalapril (VASOTEC) 10 MG tablet TAKE 1 TABLET BY MOUTH TWICE DAILY Patient taking differently: TAKE 1 TABLET BY MOUTH DAILY 05/04/15  Yes Laurey Morale, MD  hydrochlorothiazide (HYDRODIURIL) 25 MG tablet TAKE 1 TABLET BY MOUTH DAILY 07/20/15  Yes Laurey Morale, MD   BP 159/83 mmHg  Pulse 83  Temp(Src) 97.5 F (36.4 C) (Oral)  Resp 12  Ht 5\' 2"  (1.575 m)  Wt 184 lb (83.462 kg)  BMI 33.65 kg/m2  SpO2 99% Physical Exam  Constitutional: She is oriented to person, place, and time. She appears well-developed and well-nourished. No distress.  HENT:  Head: Normocephalic and atraumatic.  Nose: Nose normal.  Mouth/Throat: Oropharynx is clear and moist. No oropharyngeal exudate.  Eyes: Conjunctivae and EOM are normal. Pupils are equal, round, and reactive to light. No scleral icterus.  Neck: Normal range of motion. Neck supple. No JVD present. No tracheal deviation present. No thyromegaly present.  Cardiovascular: Normal rate, regular rhythm and normal heart sounds.  Exam reveals no gallop and no friction rub.   No murmur heard. Pulmonary/Chest: Effort normal and breath sounds normal. No respiratory distress. She has no wheezes. She exhibits no tenderness.  Abdominal: Soft. Bowel sounds are normal. She exhibits no distension and no mass. There is no tenderness. There is no rebound  and no guarding.  Musculoskeletal: Normal range of motion. She exhibits no edema or tenderness.  Lymphadenopathy:    She has no cervical adenopathy.  Neurological: She is alert and oriented to person, place, and time. No cranial nerve deficit. She exhibits normal muscle tone.  Skin: Skin is warm and dry. No rash noted. No erythema. No pallor.  Nursing note and vitals reviewed.   ED Course  Procedures (including critical care time) Labs Review Labs Reviewed  BASIC METABOLIC PANEL - Abnormal; Notable for the following:    Potassium 3.4 (*)    Chloride 100 (*)    Glucose, Bld 107 (*)     Creatinine, Ser 1.09 (*)    GFR calc non Af Amer 51 (*)    GFR calc Af Amer 59 (*)    All other components within normal limits  CBC - Abnormal; Notable for the following:    RBC 5.36 (*)    Hemoglobin 15.4 (*)    HCT 46.2 (*)    All other components within normal limits  I-STAT TROPOININ, ED    Imaging Review Dg Chest 2 View  08/18/2015  CLINICAL DATA:  Mid upper chest pain. Foreign body sensation after eating crab cakes 5 days prior. EXAM: CHEST  2 VIEW COMPARISON:  02/08/2008 FINDINGS: No radiopaque foreign body. The cardiomediastinal contours are normal, atherosclerosis of the aortic arch. Probable calcified granuloma in the left midlung. Pulmonary vasculature is normal. No consolidation, pleural effusion, or pneumothorax. No pneumomediastinum. No acute osseous abnormalities are seen. Degenerative change in the spine. IMPRESSION: No radiopaque foreign body or acute intrathoracic process. Electronically Signed   By: Jeb Levering M.D.   On: 08/18/2015 02:36   Ct Chest Wo Contrast  08/18/2015  CLINICAL DATA:  Evaluate for swallowed thrive child. Symptoms of the xiphoid level. EXAM: CT CHEST WITHOUT CONTRAST TECHNIQUE: Multidetector CT imaging of the chest was performed following the standard protocol without IV contrast. COMPARISON:  None. FINDINGS: THORACIC INLET/BODY WALL: No acute abnormality. MEDIASTINUM: Normal heart size. Atherosclerosis including prominent calcification along the left main and proximal branches. No evidence of acute vascular disease. The thoracic esophagus is predominantly gas filled and shows no internal foreign body. There is patchy density within the stomach, but not specific for ingested foreign body. No pneumomediastinum or esophageal thickening. Probable tiny sliding hiatal hernia. LUNG WINDOWS: Ground-glass density in the subpleural right lower lobe is associated with prominent endplate spurs and likely mild scar/ atelectasis. There is no edema, consolidation,  effusion, or pneumothorax. UPPER ABDOMEN: Surgical clips from cholecystectomy. Atrophy of the left liver, stable since 2005 at least. OSSEOUS: No acute fracture.  No suspicious lytic or blastic lesions. IMPRESSION: No acute finding. No foreign body noted in the esophagus, pneumomediastinum, or esophageal thickening. Electronically Signed   By: Monte Fantasia M.D.   On: 08/18/2015 06:14   I have personally reviewed and evaluated these images and lab results as part of my medical decision-making.   EKG Interpretation   Date/Time:  Thursday August 18 2015 02:07:59 EST Ventricular Rate:  84 PR Interval:  150 QRS Duration: 74 QT Interval:  358 QTC Calculation: 423 R Axis:   47 Text Interpretation:  Normal sinus rhythm Nonspecific ST and T wave  abnormality Abnormal ECG No significant change since last tracing  Confirmed by Glynn Octave [phone]) on 08/18/2015 4:42:09 AM      MDM   Final diagnoses:  None   patient presents to the emergency department for possible foreign body. X-rays negative,  will obtain CT scan for further evaluation.  CT scan negative for foreign body.  Patient has been able to tolerate oral meals as well.  Will provide GI follow up for possible endoscopy.  It is likely the crab shell scratched the patient on the way down, leaving a foreign body sensation.  This was explained to the patient.  She appears well and in NAD.  VS remain within her normal limits and she is safe for DC.  Everlene Balls, MD 08/18/15 [phone]

## 2015-08-18 NOTE — Discharge Instructions (Signed)
Swallowed Foreign Body, Adult [name], your CT scan results are below and do not show any objects in your esophagus.  See gastroenterology within 3 days if your symptoms have not improved.  For any worsening symptoms, come back to the ED Immediately. Thank you.   IMPRESSION: No acute finding. No foreign body noted in the esophagus, pneumomediastinum, or esophageal thickening.   A swallowed foreign body means that you swallow something and it gets stuck. It might be food or something else. The object may get stuck in the tube that connects your throat to your stomach (esophagus), or it may get stuck in another part of your belly (digestive tract). It is very important to tell your doctor what you have swallowed. Sometimes, the object will pass through your body on its own. Sometimes, the object will pass after you are given a medicine to relax your throat. The object may need to be taken out by a doctor if it is dangerous or if it will not pass through your body on its own. An object may need to be removed with surgery if:  It gets stuck in your throat.  You cannot swallow.  You cannot breathe well.  It is sharp.  It is harmful or poisonous (toxic), like batteries or drugs. HOME CARE  Eat what you normally eat if your doctor says that you can.  Keep checking your poop (stool) to see if the object has come out of your body.  Call your doctor if the object has not come out of your body after 3 days. If you had surgery (endoscopy) to remove the object:  Care for yourself after surgery as told by your doctor. Keep all follow-up visits as told by your doctor. This is important. SEEK MEDICAL CARE IF:  You still have problems after you have been treated.  The object has not come out of your body after 3 days. GET HELP RIGHT AWAY IF:  You have a fever.  You have pain in your chest or your belly.  You cough up blood.  You have blood in your poop or your throw up (vomit).     This information is not intended to replace advice given to you by your health care provider. Make sure you discuss any questions you have with your health care provider.   Document Released: 10/17/2010 Document Revised: 03/23/2015 Document Reviewed: 09/29/2014 Elsevier Interactive Patient Education [employer].

## 2015-08-19 ENCOUNTER — Ambulatory Visit: Payer: Medicare HMO | Admitting: Physician Assistant

## 2015-10-18 ENCOUNTER — Other Ambulatory Visit: Payer: Self-pay | Admitting: Family Medicine

## 2015-10-31 ENCOUNTER — Other Ambulatory Visit: Payer: Self-pay | Admitting: Family Medicine

## 2015-11-22 ENCOUNTER — Telehealth: Payer: Self-pay

## 2015-11-22 NOTE — Telephone Encounter (Signed)
Patient is on my [employer] list for 2017. Patient has not had an AWV with PCP and may be a good candidate for you at [facility].

## 2015-11-28 NOTE — Telephone Encounter (Signed)
Referral regarding AWV; called the patient and left a detailed message for her to call back to discuss AWV. Left direct number [phone] [facility]

## 2015-11-28 NOTE — Telephone Encounter (Signed)
[name] called back regarding AWV and schedule 05/26 at 2pm.

## 2015-12-09 ENCOUNTER — Ambulatory Visit (INDEPENDENT_AMBULATORY_CARE_PROVIDER_SITE_OTHER): Payer: Medicare HMO

## 2015-12-09 VITALS — BP 168/100 | HR 70 | Ht 62.0 in | Wt 188.4 lb

## 2015-12-09 DIAGNOSIS — Z1231 Encounter for screening mammogram for malignant neoplasm of breast: Secondary | ICD-10-CM | POA: Diagnosis not present

## 2015-12-09 DIAGNOSIS — E2839 Other primary ovarian failure: Secondary | ICD-10-CM

## 2015-12-09 DIAGNOSIS — Z0000 Encounter for general adult medical examination without abnormal findings: Secondary | ICD-10-CM

## 2015-12-09 NOTE — Progress Notes (Signed)
Subjective:   [name] is a 70 y.o. female who presents for Medicare Annual (Subsequent) preventive examination.  Review of Systems:  HRA assessment completed during visit; [name]  The Patient was informed that this wellness visit is to identify risk and educate on how to reduce risk for increase disease through lifestyle changes.   ROS deferred to CPE exam with physician completed 02/2015 for acute; Labs from 10/2013 Labs refilled x 90 days with no refills; need to schedule MD apt. Family and medical hx given below;  Mother had HD and MI in her 6's; (father was stepfather)  Brother had hyperlipidemia; htn; DM; HD; PVD  Describes health as good; some days are better than others   Medical  Hemiplegia/ cerebral infraction on KPN; patient states she  had periods where she felt like her right side was going to collapse; had MRI: no issues at present/ a many years ago and has  no problem since   Lifestyle review:   Lipids; 2014; chol 221; Trig 189; HDL 52 and LDL 131 (A1c 5.7)  HTN; medically managed/ only taking enalapril one time a day; ordered BID Rotator cuff (right shoulder) no issues expressed  Obesity/ educated and reviewed in lieu of elevated BP today  Tobacco: never smoked  ETOH no  Medication review/ taking HCTZ q d, Taking Enalapril 10 mg one time a day; ordered twice daily;  advised to take tonight and states she will check BP at home as it has been running "good". Asked if 170/100 was normal as she is almost 70yo. Educated as to microvascular events and that her BP needs to be less than 140/80 or otherwise at long term risk for stroke, memory loss or other; Denies Chest pain, more sob with activity but has not exercised in awhile.   BMI: 34.5   Diet;   Eats fruit and whole grain toast Loves vegetables; has garden; squash, beans, cucumbers, butter beans, tomatoes; Has canned in the past. Lunch; sometimes; eats bananas;  Cooks some;  Supper; salmon,  potatoes, peas, corn bread, veg soups States she eats well; loves vegetables;   Exercise;   Haven't been doing regular workout; shopping and housework; laundry; does daily Likes going to the park; gardening;  C/o difficult to get up and down; decreased strength, mobility and energy;  Insurance pay for gym membership; loved it in the past.   Barrier; is insomnia; secondary fatigue  Educated on exercise increases energy and may help her sleep; educated regarding sleep etiquette  States she gets a "little sob with activity"  No chest pain otherwise   HOME SAFETY; one level with basement;  no issues going up and down stairs   Falls: had several; last one was last year in June; tripped going down a curb; Left knee hurt x 1 year but now better; fall was last June  Removal of clutter clearing paths through the home,   Railing as needed discussed  Shower is free standing   Warehouse manager; YES Smoke detectors YES Firearms safety reviewed and will keep in a safe place if these exist. Driving accidents; no;   Advised to use sun protection or large brim hat/wears a hat sometimes   Depression: Denies feeling depressed or hopeless; voices pleasure in daily life; Affect somewhat slow but does seem to be impacted by hearing loss. 2000hz  in both ears;   Goal; Weight loss and exercise as discussed   Cognitive; given AD8 list to review; seemed overwhelmed  MMSE 28/30 secondary  to orientation; Could not subtract serial 7's from 100 but was able to count serial 3's from 20. Recall x3; Stated colonoscopy was a year ago; Call to Dr. Oletta Lamas office and last exam 2011;    Fall assessment: not since last year   Sleep pattern changes; States she does not sleep well x 2 to 3 years; will wake up at 2am and can't go back to sleep. Encouraged that exercise may help  Urinary or fecal incontinence reviewed: not addressed   Counseling Health Maintenance Hep c: educated on potential  Colonoscopy; Nov  2011 per Dr. Linda Hedges note  Call placed to Dr. Oletta Lamas in GI and confirmed last colonoscopy was Aug 13, 2009; plan was hemoccult  Stool in 3 years and repeat colonoscopy in 10 years. Stated on the way out she had blood in stool about 2 weeks ago; small amount and cleared after several BMs; will fup with Doctor at [facility].  EKG: 08/2015  Mammogram: Due; has not had one in a long time; The breast center  Dexa/ Due; prefers to go to the Breast Center   PAP: educated regarding the need for GYN exam; Dr. Matthew Saras if she would plans to have GYN fup    Hearing: had hearing test; 2000hz  in both ears; not ready for hearing aid;   Ophthalmology exam: feels like vision is more blurred; difficulty making out signs; Putting off cataract removal; annual eye exam; Dr. Marica Otter; states eye doctor dx HTN;   Immunizations Due: TD; Prevnar (Allergies Amoxicillin)  Patient had to think about vaccines but was educated ; given material on safety; educated efficacy   Advanced Directive; materials given in the past 2/2 2017  Health Recommendations and Referrals  Check BP at home; elevated today; encouraged low sodium; increased water;  Educated normal  BP < 140/80 goal [facility] elevated BP today with weight gain and stated she will get serious about her weight loss. Goal was set;  Agreed to come in for CPE for labs  And fup x  Soon for BP check and medication review.     Barriers to Success Very slow in thought and action; Gets overwhelmed; Putting off health for a long time; Tried to prioritize her needs.    Current Care Team reviewed and updated Dr. Oletta Lamas GI;  Cardiac Risk Factors include: advanced age (>92men, >40 women);dyslipidemia;family history of premature cardiovascular disease;hypertension;obesity (BMI >30kg/m2);sedentary lifestyle       Objective:     Vitals: BP 168/100 mmHg  Pulse 70  Ht 5\' 2"  (1.575 m)  Wt 188 lb 7 oz (85.475 kg)  BMI 34.46 kg/m2  SpO2 98%  Body mass index is  34.46 kg/(m^2).   Tobacco History  Smoking status  . Never Smoker   Smokeless tobacco  . Never Used     Counseling given: Yes   Past Medical History  Diagnosis Date  . Backache, unspecified   . Hemiplegia, unspecified, affecting unspecified side   . Other and unspecified hyperlipidemia   . Lacunar infarction ([place])   . Rotator cuff injury     right shoulder  . Retinal defect, unspecified   . Venous tributary (branch) occlusion of retina   . Unspecified essential hypertension    Past Surgical History  Procedure Laterality Date  . Cholecystectomy    . Sphincterotomy    . Abdominal hysterectomy     Family History  Problem Relation Age of Onset  . Heart disease Mother     died at age 25  .  Hyperlipidemia Brother   . Hypertension Brother   . Diabetes Brother   . Heart disease Brother     CAD/MI/CABG  . Peripheral vascular disease Brother    History  Sexual Activity  . Sexual Activity: No    Outpatient Encounter Prescriptions as of 12/09/2015  Medication Sig  . enalapril (VASOTEC) 10 MG tablet TAKE 1 TABLET BY MOUTH TWICE DAILY  . hydrochlorothiazide (HYDRODIURIL) 25 MG tablet TAKE 1 TABLET BY MOUTH DAILY   Facility-Administered Encounter Medications as of 12/09/2015  Medication  . zoster vaccine live (PF) (ZOSTAVAX) injection 19,400 Units    Activities of Daily Living In your present state of health, do you have any difficulty performing the following activities: 12/09/2015  Hearing? (No Data)  Vision? (No Data)  Difficulty concentrating or making decisions? Y  Walking or climbing stairs? N  Dressing or bathing? N  Doing errands, shopping? N  Preparing Food and eating ? N  Using the Toilet? N  In the past six months, have you accidently leaked urine? N  Do you have problems with loss of bowel control? N  Managing your Medications? N  Managing your Finances? N  Housekeeping or managing your Housekeeping? N    Patient Care Team: Laurey Morale, MD as PCP  - General (Family Medicine)    Assessment:     Exercise Activities and Dietary recommendations Current Exercise Habits: Home exercise routine;Structured exercise class, Type of exercise: strength training/weights;walking;Other - see comments (discussed exercise at [facility]; agreed to try ), Intensity: Moderate  Goals    . Exercise 150 minutes per week (moderate activity)     Will try 30 minutes 3 times a week; You can get gradually, starting [facility]  Try the silver sneakers class for instruction on what exercises are helpful to you. Also, when accessing the fitness center; generally someone will show you what is available.  May try curves if there is one your area.      . Weight < 160 lb (72.576 kg)     Try drinking water; Diary daily x one week; weight and food intake and water intake; and exercise;  Note patterns       Fall Risk Fall Risk  12/09/2015 03/16/2015 12/17/2014  Falls in the past year? Yes Yes Yes  Number falls in past yr: 1 1 2  or more  Injury with Fall? - Yes Yes  Follow up Education provided Falls evaluation completed Falls evaluation completed   Depression Screen PHQ 2/9 Scores 03/16/2015  PHQ - 2 Score 0     Cognitive Testing No flowsheet data found.  MMSE 28/30; very timid with medical care; hesitates in generally thinking;  Affect pleasant;   Immunization History  Administered Date(s) Administered  . Zoster 03/27/2011   Screening Tests Health Maintenance  Topic Date Due  . Hepatitis C Screening  [birth year]  . TETANUS/TDAP  06/22/1965  . MAMMOGRAM  06/22/1996  . DEXA SCAN  06/23/2011  . PNA vac Low Risk Adult (1 of 2 - PCV13) 06/23/2011  . INFLUENZA VACCINE  02/14/2016  . COLONOSCOPY  01/12/2020  . ZOSTAVAX  Completed      Plan:     The patient was assisted to prioritize her health care needs:  Apt for CPE and fup due to the patients bp being 170/100; was not aware this was high; Advised to take enalapril as ordered bid but did not  want to do this at this time, but will check her BP  at home to be sure it comes down.   Apt scheduled for Dr. Sarajane Jews to evaluate BP and medication; the patient will bring her home BP monitor  Eye exam needed; Marica Otter;  Needs cataract surgery;  Schedule mammogram and dexa at the breast center;  Consider pneumonia vaccinations  Start weight loss initiative and exercise; understands that she has not followed up on her health and feels if she loses weight, her BP will come down.   Other resources given for hearing and eyeglasses if needed;   During the course of the visit the patient was educated and counseled about the following appropriate screening and preventive services:   Vaccines to include Pneumoccal, Influenza, Hepatitis B, Td, Zostavax, HCV/ declined the Tdap and Prevnar today; given information and strongly encouraged to take her pneumonia series  Electrocardiogram - 08/2015  Cardiovascular Disease / BP elevated; non compliant with meds; diet; exercise; fup apt made   Colorectal cancer screening/ stated upon leaving that she had scant amount of blood in her stool 2 weeks ago; cleared after 3 bm; to fup with MD   Bone density screening/ agreed to schedule at the Breast center  Diabetes screening/ fup labs due  Glaucoma screening/ eye apt due; tbs  Mammography/ agreed to schedule mammogram at the Breast center  Nutrition counseling / spent 30 minutes discussing goals; weight loss and exercise;   Patient Instructions (the written plan) was given to the patient.   Wynetta Fines, RN  12/09/2015

## 2015-12-09 NOTE — Patient Instructions (Addendum)
[name] , Thank you for taking time to come for your Medicare Wellness Visit. I appreciate your ongoing commitment to your health goals. Please review the following plan we discussed and let me know if I can assist you in the future.   PRIORITIZE!!   fup on physical and BP fup on cataracts  Schedule mammogram and dexa Consider pneumonia vaccinations   Good luck on exercise   Will schedule eye exam  Schedule mammogram and dexa;  [facility]  Address: [address], [place], [place] [address]  Phone: [phone]  Hours:  Open today  7AM-5PM   BP elevated today; Please check at home and bring in to your apt with Dr. Sarajane Jews; Bring BP cuff;   Can go to [email or web address] for information on calories Journal your diet; weight daily x 5 days and then alter as possible to lose weight   Hearing check. Reviewed for annual vision exam; Diabetes and weight loss; [email or web address]  Deaf & Hard of Hearing Division Services - help with one hearing aid No reviews  [facility]  [id number]  [phone]  The [place] assistance for eyewear is coordinated through [facility]; Please call Cherlyn Labella at [phone]   These are the goals we discussed: Goals    . Exercise 150 minutes per week (moderate activity)     Will try 30 minutes 3 times a week; You can get gradually[facility]  Try the silver sneakers class for instruction on what exercises are helpful to you. Also, when accessing the fitness center; generally someone will show you what is available.  May try curves if there is one your area.      . Weight < 160 lb (72.576 kg)     Try drinking water; Diary daily x one week; weight and food intake and water intake; and exercise;  Note patterns        This is a list of the screening recommended for you and due dates:  Health Maintenance  Topic Date Due   .  Hepatitis C: One time screening is recommended by Center for Disease Control  (CDC) for  adults born from [birth year] through 1965.   Jul 27, 1945  . Tetanus Vaccine  06/22/1965  . Mammogram  06/22/1996  . DEXA scan (bone density measurement)  06/23/2011  . Pneumonia vaccines (1 of 2 - PCV13) 06/23/2011  . Flu Shot  02/14/2016  . Colon Cancer Screening  01/12/2020  . Shingles Vaccine  Completed     Bone Densitometry Bone densitometry is an imaging test that uses a special X-ray to measure the amount of calcium and other minerals in your bones (bone density). This test is also known as a bone mineral density test or dual-energy X-ray absorptiometry (DXA). The test can measure bone density at your hip and your spine. It is similar to having a regular X-ray. You may have this test to:  Diagnose a condition that causes weak or thin bones (osteoporosis).  Predict your risk of a broken bone (fracture).  Determine how well osteoporosis treatment is working. LET [facility] CARE PROVIDER KNOW ABOUT:  Any allergies you have.  All medicines you are taking, including vitamins, herbs, eye drops, creams, and over-the-counter medicines.  Previous problems you or members of your family have had with the use of anesthetics.  Any blood disorders you have.  Previous surgeries you have had.  Medical conditions you have.  Possibility of pregnancy.  Any other medical test you had within the previous 14 days that used contrast material. RISKS AND COMPLICATIONS Generally, this is a safe procedure. However, problems can occur and may include the following:  This test exposes you to a very small amount of radiation.  The risks of radiation exposure may be greater to unborn children. BEFORE THE PROCEDURE  Do not take any calcium supplements for 24 hours before having the test. You can otherwise eat and drink what you usually do.  Take off all metal jewelry, eyeglasses, dental appliances, and any  other metal objects. PROCEDURE  You may lie on an exam table. There will be an X-ray generator below you and an imaging device above you.  Other devices, such as boxes or braces, may be used to position your body properly for the scan.  You will need to lie still while the machine slowly scans your body.  The images will show up on a computer monitor. AFTER THE PROCEDURE You may need more testing at a later time.   This information is not intended to replace advice given to you by your health care provider. Make sure you discuss any questions you have with your health care provider.   Document Released: 07/24/2004 Document Revised: 07/23/2014 Document Reviewed: 12/10/2013 Elsevier Interactive Patient Education 2016 Elsevier Inc.  Fat and Cholesterol Restricted Diet High levels of fat and cholesterol in your blood may lead to various health problems, such as diseases of the heart, blood vessels, gallbladder, liver, and pancreas. Fats are concentrated sources of energy that come in various forms. Certain types of fat, including saturated fat, may be harmful in excess. Cholesterol is a substance needed by your body in small amounts. Your body makes all the cholesterol it needs. Excess cholesterol comes from the food you eat. When you have high levels of cholesterol and saturated fat in your blood, health problems can develop because the excess fat and cholesterol will gather along the walls of your blood vessels, causing them to narrow. Choosing the right foods will help you control your intake of fat and cholesterol. This will help keep the levels of these substances in your blood within normal limits and reduce your risk of disease. WHAT IS MY PLAN? Your health care provider recommends that you:  Get no more than __________ % of the total calories in your daily diet from fat.  Limit your intake of saturated fat to less than ______% of your total calories each day.  Limit the amount of  cholesterol in your diet to less than _________mg per day. WHAT TYPES OF FAT SHOULD I CHOOSE?  Choose healthy fats more often. Choose monounsaturated and polyunsaturated fats, such as olive and canola oil, flaxseeds, walnuts, almonds, and seeds.  Eat more omega-3 fats. Good choices include salmon, mackerel, sardines, tuna, flaxseed oil, and ground flaxseeds. Aim to eat fish at least two times a week.  Limit saturated fats. Saturated fats are primarily found in animal products, such as meats, butter, and cream. Plant sources of saturated fats include palm oil, palm kernel oil, and coconut oil.  Avoid foods with partially hydrogenated oils in them. These contain trans fats. Examples of foods that contain trans fats are stick margarine, some tub margarines, cookies, crackers, and other baked goods. WHAT GENERAL GUIDELINES DO I NEED TO FOLLOW? These guidelines for healthy eating will help you control your intake of fat and cholesterol:  Check food labels carefully to identify foods with trans fats or high amounts of saturated  fat.  Fill one half of your plate with vegetables and green salads.  Fill one fourth of your plate with whole grains. Look for the word "whole" as the first word in the ingredient list.  Fill one fourth of your plate with lean protein foods.  Limit fruit to two servings a day. Choose fruit instead of juice.  Eat more foods that contain soluble fiber. Examples of foods that contain this type of fiber are apples, broccoli, carrots, beans, peas, and barley. Aim to get 20-30 g of fiber per day.  Eat more home-cooked food and less restaurant, buffet, and fast food.  Limit or avoid alcohol.  Limit foods high in starch and sugar.  Limit fried foods.  Cook foods using methods other than frying. Baking, boiling, grilling, and broiling are all great options.  Lose weight if you are overweight. Losing just 5-10% of your initial body weight can help your overall health and  prevent diseases such as diabetes and heart disease. WHAT FOODS CAN I EAT? Grains Whole grains, such as whole wheat or whole grain breads, crackers, cereals, and pasta. Unsweetened oatmeal, bulgur, barley, quinoa, or brown rice. Corn or whole wheat flour tortillas. Vegetables Fresh or frozen vegetables (raw, steamed, roasted, or grilled). Green salads. Fruits All fresh, canned (in natural juice), or frozen fruits. Meat and Other Protein Products Ground beef (85% or leaner), grass-fed beef, or beef trimmed of fat. Skinless chicken or [place]. Ground chicken or [place]. Pork trimmed of fat. All fish and seafood. Eggs. Dried beans, peas, or lentils. Unsalted nuts or seeds. Unsalted canned or dry beans. Dairy Low-fat dairy products, such as skim or 1% milk, 2% or reduced-fat cheeses, low-fat ricotta or cottage cheese, or plain low-fat yogurt. Fats and Oils Tub margarines without trans fats. Light or reduced-fat mayonnaise and salad dressings. Avocado. Olive, canola, sesame, or safflower oils. Natural peanut or almond butter (choose ones without added sugar and oil). The items listed above may not be a complete list of recommended foods or beverages. Contact your dietitian for more options. WHAT FOODS ARE NOT RECOMMENDED? Grains White bread. White pasta. White rice. Cornbread. Bagels, pastries, and croissants. Crackers that contain trans fat. Vegetables White potatoes. Corn. Creamed or fried vegetables. Vegetables in a cheese sauce. Fruits Dried fruits. Canned fruit in light or heavy syrup. Fruit juice. Meat and Other Protein Products Fatty cuts of meat. Ribs, chicken wings, bacon, sausage, bologna, salami, chitterlings, fatback, hot dogs, bratwurst, and packaged luncheon meats. Liver and organ meats. Dairy Whole or 2% milk, cream, half-and-half, and cream cheese. Whole milk cheeses. Whole-fat or sweetened yogurt. Full-fat cheeses. Nondairy creamers and whipped toppings. Processed cheese, cheese  spreads, or cheese curds. Sweets and Desserts Corn syrup, sugars, honey, and molasses. Candy. Jam and jelly. Syrup. Sweetened cereals. Cookies, pies, cakes, donuts, muffins, and ice cream. Fats and Oils Butter, stick margarine, lard, shortening, ghee, or bacon fat. Coconut, palm kernel, or palm oils. Beverages Alcohol. Sweetened drinks (such as sodas, lemonade, and fruit drinks or punches). The items listed above may not be a complete list of foods and beverages to avoid. Contact your dietitian for more information.   This information is not intended to replace advice given to you by your health care provider. Make sure you discuss any questions you have with your health care provider.   Document Released: 07/02/2005 Document Revised: 07/23/2014 Document Reviewed: 09/30/2013 Elsevier Interactive Patient Education 2016 [place] in the Home  Falls can cause injuries. They can happen  to people of all ages. There are many things you can do to make your home safe and to help prevent falls.  WHAT CAN I DO ON THE OUTSIDE OF MY HOME?  Regularly fix the edges of walkways and driveways and fix any cracks.  Remove anything that might make you trip as you walk through a door, such as a raised step or threshold.  Trim any bushes or trees on the path to your home.  Use bright outdoor lighting.  Clear any walking paths of anything that might make someone trip, such as rocks or tools.  Regularly check to see if handrails are loose or broken. Make sure that both sides of any steps have handrails.  Any raised decks and porches should have guardrails on the edges.  Have any leaves, snow, or ice cleared regularly.  Use sand or salt on walking paths during winter.  Clean up any spills in your garage right away. This includes oil or grease spills. WHAT CAN I DO IN THE BATHROOM?   Use night lights.  Install grab bars by the toilet and in the tub and shower. Do not use towel  bars as grab bars.  Use non-skid mats or decals in the tub or shower.  If you need to sit down in the shower, use a plastic, non-slip stool.  Keep the floor dry. Clean up any water that spills on the floor as soon as it happens.  Remove soap buildup in the tub or shower regularly.  Attach bath mats securely with double-sided non-slip rug tape.  Do not have throw rugs and other things on the floor that can make you trip. WHAT CAN I DO IN THE BEDROOM?  Use night lights.  Make sure that you have a light by your bed that is easy to reach.  Do not use any sheets or blankets that are too big for your bed. They should not hang down onto the floor.  Have a firm chair that has side arms. You can use this for support while you get dressed.  Do not have throw rugs and other things on the floor that can make you trip. WHAT CAN I DO IN THE KITCHEN?  Clean up any spills right away.  Avoid walking on wet floors.  Keep items that you use a lot in easy-to-reach places.  If you need to reach something above you, use a strong step stool that has a grab bar.  Keep electrical cords out of the way.  Do not use floor polish or wax that makes floors slippery. If you must use wax, use non-skid floor wax.  Do not have throw rugs and other things on the floor that can make you trip. WHAT CAN I DO WITH MY STAIRS?  Do not leave any items on the stairs.  Make sure that there are handrails on both sides of the stairs and use them. Fix handrails that are broken or loose. Make sure that handrails are as long as the stairways.  Check any carpeting to make sure that it is firmly attached to the stairs. Fix any carpet that is loose or worn.  Avoid having throw rugs at the top or bottom of the stairs. If you do have throw rugs, attach them to the floor with carpet tape.  Make sure that you have a light switch at the top of the stairs and the bottom of the stairs. If you do not have them, ask someone to  add them for  you. WHAT ELSE CAN I DO TO HELP PREVENT FALLS?  Wear shoes that:  Do not have high heels.  Have rubber bottoms.  Are comfortable and fit you well.  Are closed at the toe. Do not wear sandals.  If you use a stepladder:  Make sure that it is fully opened. Do not climb a closed stepladder.  Make sure that both sides of the stepladder are locked into place.  Ask someone to hold it for you, if possible.  Clearly mark and make sure that you can see:  Any grab bars or handrails.  First and last steps.  Where the edge of each step is.  Use tools that help you move around (mobility aids) if they are needed. These include:  Canes.  Walkers.  Scooters.  Crutches.  Turn on the lights when you go into a dark area. Replace any light bulbs as soon as they burn out.  Set up your furniture so you have a clear path. Avoid moving your furniture around.  If any of your floors are uneven, fix them.  If there are any pets around you, be aware of where they are.  Review your medicines with your doctor. Some medicines can make you feel dizzy. This can increase your chance of falling. Ask your doctor what other things that you can do to help prevent falls.   This information is not intended to replace advice given to you by your health care provider. Make sure you discuss any questions you have with your health care provider.   Document Released: 04/28/2009 Document Revised: 11/16/2014 Document Reviewed: 08/06/2014 Elsevier Interactive Patient Education 2016 [place] Maintenance, Female Adopting a healthy lifestyle and getting preventive care can go a long way to promote health and wellness. Talk with your health care provider about what schedule of regular examinations is right for you. This is a good chance for you to check in with your provider about disease prevention and staying healthy. In between checkups, there are plenty of things you can do on your  own. Experts have done a lot of research about which lifestyle changes and preventive measures are most likely to keep you healthy. Ask your health care provider for more information. WEIGHT AND DIET  Eat a healthy diet  Be sure to include plenty of vegetables, fruits, low-fat dairy products, and lean protein.  Do not eat a lot of foods high in solid fats, added sugars, or salt.  Get regular exercise. This is one of the most important things you can do for your health.  Most adults should exercise for at least 150 minutes each week. The exercise should increase your heart rate and make you sweat (moderate-intensity exercise).  Most adults should also do strengthening exercises at least twice a week. This is in addition to the moderate-intensity exercise.  Maintain a healthy weight  Body mass index (BMI) is a measurement that can be used to identify possible weight problems. It estimates body fat based on height and weight. Your health care provider can help determine your BMI and help you achieve or maintain a healthy weight.  For females 73 years of age and older:   A BMI below 18.5 is considered underweight.  A BMI of 18.5 to 24.9 is normal.  A BMI of 25 to 29.9 is considered overweight.  A BMI of 30 and above is considered obese.  Watch levels of cholesterol and blood lipids  You should start having your blood tested for  lipids and cholesterol at 70 years of age, then have this test every 5 years.  You may need to have your cholesterol levels checked more often if:  Your lipid or cholesterol levels are high.  You are older than 70 years of age.  You are at high risk for heart disease.  CANCER SCREENING   Lung Cancer  Lung cancer screening is recommended for adults 64-77 years old who are at high risk for lung cancer because of a history of smoking.  A yearly low-dose CT scan of the lungs is recommended for people who:  Currently smoke.  Have quit within the past  15 years.  Have at least a 30-pack-year history of smoking. A pack year is smoking an average of one pack of cigarettes a day for 1 year.  Yearly screening should continue until it has been 15 years since you quit.  Yearly screening should stop if you develop a health problem that would prevent you from having lung cancer treatment.  Breast Cancer  Practice breast self-awareness. This means understanding how your breasts normally appear and feel.  It also means doing regular breast self-exams. Let your health care provider know about any changes, no matter how small.  If you are in your 20s or 30s, you should have a clinical breast exam (CBE) by a health care provider every 1-3 years as part of a regular health exam.  If you are 60 or older, have a CBE every year. Also consider having a breast X-ray (mammogram) every year.  If you have a family history of breast cancer, talk to your health care provider about genetic screening.  If you are at high risk for breast cancer, talk to your health care provider about having an MRI and a mammogram every year.  Breast cancer gene (BRCA) assessment is recommended for women who have family members with BRCA-related cancers. BRCA-related cancers include:  Breast.  Ovarian.  Tubal.  Peritoneal cancers.  Results of the assessment will determine the need for genetic counseling and BRCA1 and BRCA2 testing. Cervical Cancer Your health care provider may recommend that you be screened regularly for cancer of the pelvic organs (ovaries, uterus, and vagina). This screening involves a pelvic examination, including checking for microscopic changes to the surface of your cervix (Pap test). You may be encouraged to have this screening done every 3 years, beginning at age 33.  For women ages 38-65, health care providers may recommend pelvic exams and Pap testing every 3 years, or they may recommend the Pap and pelvic exam, combined with testing for human  papilloma virus (HPV), every 5 years. Some types of HPV increase your risk of cervical cancer. Testing for HPV may also be done on women of any age with unclear Pap test results.  Other health care providers may not recommend any screening for nonpregnant women who are considered low risk for pelvic cancer and who do not have symptoms. Ask your health care provider if a screening pelvic exam is right for you.  If you have had past treatment for cervical cancer or a condition that could lead to cancer, you need Pap tests and screening for cancer for at least 20 years after your treatment. If Pap tests have been discontinued, your risk factors (such as having a new sexual partner) need to be reassessed to determine if screening should resume. Some women have medical problems that increase the chance of getting cervical cancer. In these cases, your health care provider may recommend  more frequent screening and Pap tests. Colorectal Cancer  This type of cancer can be detected and often prevented.  Routine colorectal cancer screening usually begins at 70 years of age and continues through 70 years of age.  Your health care provider may recommend screening at an earlier age if you have risk factors for colon cancer.  Your health care provider may also recommend using home test kits to check for hidden blood in the stool.  A small camera at the end of a tube can be used to examine your colon directly (sigmoidoscopy or colonoscopy). This is done to check for the earliest forms of colorectal cancer.  Routine screening usually begins at age 16.  Direct examination of the colon should be repeated every 5-10 years through 70 years of age. However, you may need to be screened more often if early forms of precancerous polyps or small growths are found. Skin Cancer  Check your skin from head to toe regularly.  Tell your health care provider about any new moles or changes in moles, especially if there is a  change in a mole's shape or color.  Also tell your health care provider if you have a mole that is larger than the size of a pencil eraser.  Always use sunscreen. Apply sunscreen liberally and repeatedly throughout the day.  Protect yourself by wearing long sleeves, pants, a wide-brimmed hat, and sunglasses whenever you are outside. HEART DISEASE, DIABETES, AND HIGH BLOOD PRESSURE   High blood pressure causes heart disease and increases the risk of stroke. High blood pressure is more likely to develop in:  People who have blood pressure in the high end of the normal range (130-139/85-89 mm Hg).  People who are overweight or obese.  People who are African American.  If you are 62-94 years of age, have your blood pressure checked every 3-5 years. If you are 102 years of age or older, have your blood pressure checked every year. You should have your blood pressure measured twice--once when you are at a hospital or clinic, and once when you are not at a hospital or clinic. Record the average of the two measurements. To check your blood pressure when you are not at a hospital or clinic, you can use:  An automated blood pressure machine at a pharmacy.  A home blood pressure monitor.  If you are between 55 years and 84 years old, ask your health care provider if you should take aspirin to prevent strokes.  Have regular diabetes screenings. This involves taking a blood sample to check your fasting blood sugar level.  If you are at a normal weight and have a low risk for diabetes, have this test once every three years after 70 years of age.  If you are overweight and have a high risk for diabetes, consider being tested at a younger age or more often. PREVENTING INFECTION  Hepatitis B  If you have a higher risk for hepatitis B, you should be screened for this virus. You are considered at high risk for hepatitis B if:  You were born in a country where hepatitis B is common. Ask your health  care provider which countries are considered high risk.  Your parents were born in a high-risk country, and you have not been immunized against hepatitis B (hepatitis B vaccine).  You have HIV or AIDS.  You use needles to inject street drugs.  You live with someone who has hepatitis B.  You have had sex with  someone who has hepatitis B.  You get hemodialysis treatment.  You take certain medicines for conditions, including cancer, organ transplantation, and autoimmune conditions. Hepatitis C  Blood testing is recommended for:  Everyone born from [birth year] through 1965.  Anyone with known risk factors for hepatitis C. Sexually transmitted infections (STIs)  You should be screened for sexually transmitted infections (STIs) including gonorrhea and chlamydia if:  You are sexually active and are younger than 71 years of age.  You are older than 70 years of age and your health care provider tells you that you are at risk for this type of infection.  Your sexual activity has changed since you were last screened and you are at an increased risk for chlamydia or gonorrhea. Ask your health care provider if you are at risk.  If you do not have HIV, but are at risk, it may be recommended that you take a prescription medicine daily to prevent HIV infection. This is called pre-exposure prophylaxis (PrEP). You are considered at risk if:  You are sexually active and do not regularly use condoms or know the HIV status of your partner(s).  You take drugs by injection.  You are sexually active with a partner who has HIV. Talk with your health care provider about whether you are at high risk of being infected with HIV. If you choose to begin PrEP, you should first be tested for HIV. You should then be tested every 3 months for as long as you are taking PrEP.  PREGNANCY   If you are premenopausal and you may become pregnant, ask your health care provider about preconception counseling.  If you may  become pregnant, take 400 to 800 micrograms (mcg) of folic acid every day.  If you want to prevent pregnancy, talk to your health care provider about birth control (contraception). OSTEOPOROSIS AND MENOPAUSE   Osteoporosis is a disease in which the bones lose minerals and strength with aging. This can result in serious bone fractures. Your risk for osteoporosis can be identified using a bone density scan.  If you are 31 years of age or older, or if you are at risk for osteoporosis and fractures, ask your health care provider if you should be screened.  Ask your health care provider whether you should take a calcium or vitamin D supplement to lower your risk for osteoporosis.  Menopause may have certain physical symptoms and risks.  Hormone replacement therapy may reduce some of these symptoms and risks. Talk to your health care provider about whether hormone replacement therapy is right for you.  HOME CARE INSTRUCTIONS   Schedule regular health, dental, and eye exams.  Stay current with your immunizations.   Do not use any tobacco products including cigarettes, chewing tobacco, or electronic cigarettes.  If you are pregnant, do not drink alcohol.  If you are breastfeeding, limit how much and how often you drink alcohol.  Limit alcohol intake to no more than 1 drink per day for nonpregnant women. One drink equals 12 ounces of beer, 5 ounces of wine, or 1 ounces of hard liquor.  Do not use street drugs.  Do not share needles.  Ask your health care provider for help if you need support or information about quitting drugs.  Tell your health care provider if you often feel depressed.  Tell your health care provider if you have ever been abused or do not feel safe at home.   This information is not intended to replace advice given to  you by your health care provider. Make sure you discuss any questions you have with your health care provider.   Document Released: 01/15/2011  Document Revised: 07/23/2014 Document Reviewed: 06/03/2013 Elsevier Interactive Patient Education 2016 [place] A mammogram is an X-ray of the breasts that is done to check for abnormal changes. This procedure can screen for and detect any changes that may suggest breast cancer. A mammogram can also identify other changes and variations in the breast, such as:  Inflammation of the breast tissue (mastitis).  An infected area that contains a collection of pus (abscess).  A fluid-filled sac (cyst).  Fibrocystic changes. This is when breast tissue becomes denser, which can make the tissue feel rope-like or uneven under the skin.  Tumors that are not cancerous (benign). LET [facility] CARE PROVIDER KNOW ABOUT:  Any allergies you have.  If you have breast implants.  If you have had previous breast disease, biopsy, or surgery.  If you are breastfeeding.  Any possibility that you could be pregnant, if this applies.  If you are younger than age 40.  If you have a family history of breast cancer. RISKS AND COMPLICATIONS Generally, this is a safe procedure. However, problems may occur, including:  Exposure to radiation. Radiation levels are very low with this test.  The results being misinterpreted.  The need for further tests.  The inability of the mammogram to detect certain cancers. BEFORE THE PROCEDURE  Schedule your test about 1-2 weeks after your menstrual period. This is usually when your breasts are the least tender.  If you have had a mammogram done at a different facility in the past, get the mammogram X-rays or have them sent to your current exam facility in order to compare them.  Wash your breasts and under your arms the day of the test.  Do not wear deodorants, perfumes, lotions, or powders anywhere on your body on the day of the test.  Remove any jewelry from your neck.  Wear clothes that you can change into and out of  easily. PROCEDURE  You will undress from the waist up and put on a gown.  You will stand in front of the X-ray machine.  Each breast will be placed between two plastic or glass plates. The plates will compress your breast for a few seconds. Try to stay as relaxed as possible during the procedure. This does not cause any harm to your breasts and any discomfort you feel will be very brief.  X-rays will be taken from different angles of each breast. The procedure may vary among health care providers and hospitals. AFTER THE PROCEDURE  The mammogram will be examined by a specialist (radiologist).  You may need to repeat certain parts of the test, depending on the quality of the images. This is commonly done if the radiologist needs a better view of the breast tissue.  Ask when your test results will be ready. Make sure you get your test results.  You may resume your normal activities.   This information is not intended to replace advice given to you by your health care provider. Make sure you discuss any questions you have with your health care provider.   Document Released: 06/29/2000 Document Revised: 03/23/2015 Document Reviewed: 09/10/2014 Elsevier Interactive Patient Education [employer].

## 2015-12-13 ENCOUNTER — Encounter: Payer: Self-pay | Admitting: Family Medicine

## 2015-12-13 ENCOUNTER — Ambulatory Visit (INDEPENDENT_AMBULATORY_CARE_PROVIDER_SITE_OTHER): Payer: Medicare HMO | Admitting: Family Medicine

## 2015-12-13 VITALS — BP 163/92 | HR 76 | Temp 98.2°F | Ht 62.0 in | Wt 186.0 lb

## 2015-12-13 DIAGNOSIS — I10 Essential (primary) hypertension: Secondary | ICD-10-CM

## 2015-12-13 MED ORDER — ENALAPRIL MALEATE 20 MG PO TABS: 20.0000 mg | ORAL_TABLET | Freq: Every day | ORAL | Status: DC

## 2015-12-13 MED ORDER — HYDROCHLOROTHIAZIDE 25 MG PO TABS: 25.0000 mg | ORAL_TABLET | Freq: Every day | ORAL | Status: DC

## 2015-12-13 NOTE — Progress Notes (Signed)
Pre visit review using our clinic review tool, if applicable. No additional management support is needed unless otherwise documented below in the visit note. 

## 2015-12-13 NOTE — Progress Notes (Signed)
   Subjective:    Patient ID: [name], female    DOB: [birth year], 70 y.o.   MRN: [id number]  HPI    Review of Systems     Objective:   Physical Exam        Assessment & Plan:

## 2015-12-13 NOTE — Progress Notes (Signed)
   Subjective:    Patient ID: [name], female    DOB: [birth year], 70 y.o.   MRN: [id number]  HPI    Review of Systems     Objective:   Physical Exam        Assessment & Plan:  I have reviewed this note and I agree with the recommendations.  Laurey Morale, MD

## 2015-12-13 NOTE — Progress Notes (Signed)
   Subjective:    Patient ID: [name], female    DOB: 31-May-1946, 70 y.o.   MRN: [id number]  HPI Here for elevated BP readings. She feels fine. Her BP was high here recently so she started checking it regularly at home. She gets readings of 150s to160s over 90s. She had not been exercising but has been watching her sodium intake.    Review of Systems  Constitutional: Negative.   Respiratory: Negative.   Cardiovascular: Negative.   Neurological: Negative.        Objective:   Physical Exam  Constitutional: She is oriented to person, place, and time. She appears well-developed and well-nourished.  Neck: No thyromegaly present.  Cardiovascular: Normal rate, regular rhythm, normal heart sounds and intact distal pulses.   Pulmonary/Chest: Effort normal and breath sounds normal. No respiratory distress. She has no wheezes. She has no rales.  Musculoskeletal: She exhibits no edema.  Lymphadenopathy:    She has no cervical adenopathy.  Neurological: She is alert and oriented to person, place, and time.          Assessment & Plan:  Her HTN has not been well controlled. We will increase the Enalapril to 20 mg daily. Recheck at her upcoming cpx.  Laurey Morale, MD

## 2015-12-27 ENCOUNTER — Other Ambulatory Visit (INDEPENDENT_AMBULATORY_CARE_PROVIDER_SITE_OTHER): Payer: Medicare HMO

## 2015-12-27 DIAGNOSIS — R7989 Other specified abnormal findings of blood chemistry: Secondary | ICD-10-CM

## 2015-12-27 DIAGNOSIS — Z0000 Encounter for general adult medical examination without abnormal findings: Secondary | ICD-10-CM

## 2015-12-27 LAB — HEPATIC FUNCTION PANEL
ALT: 11 U/L (ref 0–35)
AST: 18 U/L (ref 0–37)
Albumin: 4 g/dL (ref 3.5–5.2)
Alkaline Phosphatase: 50 U/L (ref 39–117)
BILIRUBIN DIRECT: 0.1 mg/dL (ref 0.0–0.3)
BILIRUBIN TOTAL: 0.6 mg/dL (ref 0.2–1.2)
TOTAL PROTEIN: 7.1 g/dL (ref 6.0–8.3)

## 2015-12-27 LAB — POC URINALSYSI DIPSTICK (AUTOMATED)
Bilirubin, UA: NEGATIVE
Glucose, UA: NEGATIVE
KETONES UA: NEGATIVE
Nitrite, UA: NEGATIVE
PH UA: 5.5
PROTEIN UA: NEGATIVE
SPEC GRAV UA: 1.02
Urobilinogen, UA: 0.2

## 2015-12-27 LAB — CBC WITH DIFFERENTIAL/PLATELET
BASOS PCT: 0.5 % (ref 0.0–3.0)
Basophils Absolute: 0 10*3/uL (ref 0.0–0.1)
EOS ABS: 0.1 10*3/uL (ref 0.0–0.7)
EOS PCT: 1.5 % (ref 0.0–5.0)
HCT: 42.4 % (ref 36.0–46.0)
Hemoglobin: 14.2 g/dL (ref 12.0–15.0)
Lymphocytes Relative: 46.9 % — ABNORMAL HIGH (ref 12.0–46.0)
Lymphs Abs: 3.8 10*3/uL (ref 0.7–4.0)
MCHC: 33.5 g/dL (ref 30.0–36.0)
MCV: 84.9 fl (ref 78.0–100.0)
MONO ABS: 0.6 10*3/uL (ref 0.1–1.0)
Monocytes Relative: 7 % (ref 3.0–12.0)
NEUTROS ABS: 3.6 10*3/uL (ref 1.4–7.7)
Neutrophils Relative %: 44.1 % (ref 43.0–77.0)
PLATELETS: 192 10*3/uL (ref 150.0–400.0)
RBC: 4.99 Mil/uL (ref 3.87–5.11)
RDW: 14.4 % (ref 11.5–15.5)
WBC: 8.1 10*3/uL (ref 4.0–10.5)

## 2015-12-27 LAB — LIPID PANEL
CHOL/HDL RATIO: 4
CHOLESTEROL: 196 mg/dL (ref 0–200)
HDL: 45.1 mg/dL (ref >39.00)
NONHDL: 150.87
TRIGLYCERIDES: 227 mg/dL — AB (ref 0.0–149.0)
VLDL: 45.4 mg/dL — ABNORMAL HIGH (ref 0.0–40.0)

## 2015-12-27 LAB — LDL CHOLESTEROL, DIRECT: Direct LDL: 123 mg/dL

## 2015-12-27 LAB — HEMOGLOBIN A1C: Hgb A1c MFr Bld: 5.5 % (ref 4.6–6.5)

## 2015-12-27 LAB — BASIC METABOLIC PANEL
BUN: 12 mg/dL (ref 6–23)
CHLORIDE: 104 meq/L (ref 96–112)
CO2: 27 meq/L (ref 19–32)
CREATININE: 0.84 mg/dL (ref 0.40–1.20)
Calcium: 9.4 mg/dL (ref 8.4–10.5)
GFR: 71.34 mL/min (ref >60.00)
GLUCOSE: 85 mg/dL (ref 70–99)
Potassium: 3.2 mEq/L — ABNORMAL LOW (ref 3.5–5.1)
Sodium: 141 mEq/L (ref 135–145)

## 2015-12-27 LAB — TSH: TSH: 3.27 u[IU]/mL (ref 0.35–4.50)

## 2015-12-28 ENCOUNTER — Other Ambulatory Visit: Payer: Self-pay | Admitting: *Deleted

## 2015-12-28 MED ORDER — POTASSIUM CHLORIDE CRYS ER 20 MEQ PO TBCR: 10.0000 meq | EXTENDED_RELEASE_TABLET | Freq: Every day | ORAL | Status: DC

## 2016-01-03 ENCOUNTER — Ambulatory Visit (INDEPENDENT_AMBULATORY_CARE_PROVIDER_SITE_OTHER): Payer: Medicare HMO | Admitting: Family Medicine

## 2016-01-03 ENCOUNTER — Encounter: Payer: Self-pay | Admitting: Family Medicine

## 2016-01-03 VITALS — BP 168/98 | HR 73 | Temp 98.1°F | Ht 62.0 in | Wt 183.0 lb

## 2016-01-03 DIAGNOSIS — Z0000 Encounter for general adult medical examination without abnormal findings: Secondary | ICD-10-CM | POA: Diagnosis not present

## 2016-01-03 NOTE — Progress Notes (Signed)
   Subjective:    Patient ID: Read Drivers, female    DOB: 21-Mar-1946, 70 y.o.   MRN: [id number]  HPI 70 yr old female for a well exam. She feels fine and her BP has been under better control since we changed her medication dose. At home she has been averaging in the 130s over 80s.    Review of Systems  Constitutional: Negative.   HENT: Negative.   Eyes: Negative.   Respiratory: Negative.   Cardiovascular: Negative.   Gastrointestinal: Negative.   Genitourinary: Negative for dysuria, urgency, frequency, hematuria, flank pain, decreased urine volume, enuresis, difficulty urinating, pelvic pain and dyspareunia.  Musculoskeletal: Negative.   Skin: Negative.   Neurological: Negative.   Psychiatric/Behavioral: Negative.        Objective:   Physical Exam  Constitutional: She is oriented to person, place, and time. She appears well-developed and well-nourished. No distress.  HENT:  Head: Normocephalic and atraumatic.  Right Ear: External ear normal.  Left Ear: External ear normal.  Nose: Nose normal.  Mouth/Throat: Oropharynx is clear and moist. No oropharyngeal exudate.  Eyes: Conjunctivae and EOM are normal. Pupils are equal, round, and reactive to light. No scleral icterus.  Neck: Normal range of motion. Neck supple. No JVD present. No thyromegaly present.  Cardiovascular: Normal rate, regular rhythm, normal heart sounds and intact distal pulses.  Exam reveals no gallop and no friction rub.   No murmur heard. Pulmonary/Chest: Effort normal and breath sounds normal. No respiratory distress. She has no wheezes. She has no rales. She exhibits no tenderness.  Abdominal: Soft. Bowel sounds are normal. She exhibits no distension and no mass. There is no tenderness. There is no rebound and no guarding.  Musculoskeletal: Normal range of motion. She exhibits no edema or tenderness.  Lymphadenopathy:    She has no cervical adenopathy.  Neurological: She is alert and oriented to person,  place, and time. She has normal reflexes. No cranial nerve deficit. She exhibits normal muscle tone. Coordination normal.  Skin: Skin is warm and dry. No rash noted. No erythema.  Psychiatric: She has a normal mood and affect. Her behavior is normal. Judgment and thought content normal.          Assessment & Plan:  Well exam. We discussed diet and exercise.  Laurey Morale, MD

## 2016-01-03 NOTE — Progress Notes (Signed)
Pre visit review using our clinic review tool, if applicable. No additional management support is needed unless otherwise documented below in the visit note. 

## 2016-04-17 DIAGNOSIS — Z23 Encounter for immunization: Secondary | ICD-10-CM | POA: Diagnosis not present

## 2016-05-04 DIAGNOSIS — H60332 Swimmer's ear, left ear: Secondary | ICD-10-CM | POA: Diagnosis not present

## 2016-05-04 DIAGNOSIS — H6122 Impacted cerumen, left ear: Secondary | ICD-10-CM | POA: Diagnosis not present

## 2016-05-17 DIAGNOSIS — H60332 Swimmer's ear, left ear: Secondary | ICD-10-CM | POA: Diagnosis not present

## 2016-05-23 DIAGNOSIS — R69 Illness, unspecified: Secondary | ICD-10-CM | POA: Diagnosis not present

## 2016-09-19 ENCOUNTER — Ambulatory Visit (INDEPENDENT_AMBULATORY_CARE_PROVIDER_SITE_OTHER): Payer: Medicare HMO | Admitting: Family Medicine

## 2016-09-19 ENCOUNTER — Encounter: Payer: Self-pay | Admitting: Family Medicine

## 2016-09-19 VITALS — BP 162/98 | HR 100 | Temp 98.3°F | Wt 192.2 lb

## 2016-09-19 DIAGNOSIS — L0390 Cellulitis, unspecified: Secondary | ICD-10-CM | POA: Diagnosis not present

## 2016-09-19 MED ORDER — CEPHALEXIN 500 MG PO CAPS: 500.0000 mg | ORAL_CAPSULE | Freq: Four times a day (QID) | ORAL | 0 refills | Status: DC

## 2016-09-19 NOTE — Progress Notes (Signed)
Pre visit review using our clinic review tool, if applicable. No additional management support is needed unless otherwise documented below in the visit note. 

## 2016-09-19 NOTE — Progress Notes (Signed)
Subjective:     Patient ID: [name], female   DOB: 1946/06/07, 71 y.o.   MRN: [id number]  HPI Patient seen with acute swelling and pain of the right index finger which seemed to start over her PIP joint. She denies any injury. She first noted pain and swelling Saturday night and has some progressive erythema spreading toward the hand since then. She thinks she had some chills last night but is not sure but has not had any documented fever. She has no history of MRSA. No known injury. Previous intolerance of amoxicillin with nausea but no true allergy. She denies any history of gout and has no family history of gout.  Past Medical History:  Diagnosis Date  . Backache, unspecified   . Hemiplegia, unspecified, affecting unspecified side   . Lacunar infarction ([place])   . Other and unspecified hyperlipidemia   . Retinal defect, unspecified   . Rotator cuff injury    right shoulder  . Unspecified essential hypertension   . Venous tributary (branch) occlusion of retina    Past Surgical History:  Procedure Laterality Date  . ABDOMINAL HYSTERECTOMY    . CHOLECYSTECTOMY    . COLONOSCOPY  01-11-10   per Dr. Laurence Spates, internal hemorrhoids only, repeat in 10 yrs   . SPHINCTEROTOMY      reports that she has never smoked. She has never used smokeless tobacco. She reports that she does not drink alcohol or use drugs. family history includes Diabetes in her brother; Heart disease in her brother and mother; Hyperlipidemia in her brother; Hypertension in her brother; Peripheral vascular disease in her brother. Allergies  Allergen Reactions  . Amoxicillin Nausea Only     Review of Systems  Constitutional: Negative for chills and fever.  Respiratory: Negative for shortness of breath.   Gastrointestinal: Negative for nausea and vomiting.       Objective:   Physical Exam  Constitutional: She appears well-developed and well-nourished.  Cardiovascular: Normal rate and regular rhythm.    Pulmonary/Chest: Effort normal and breath sounds normal. No respiratory distress. She has no wheezes. She has no rales.  Skin:  Erythema swelling and tenderness mostly involving the right PIP joint of the index finger. She has some erythema extending proximally involving the distal part of the right hand. She also is noted to have erythematous streak which spreads all with up toward elbow region. Minimal warmth. Moderate tenderness. Limited range of motion and exiting or secondary to swelling. No visible break in the skin. No drainage.       Assessment:     Acute swelling and pain involving the right index finger predominantly PIP joint. Differential would include most likely cellulitis (given the fact she has erythematous streak speading toward elbow). She has no history of gout. He does not have signs of systemic toxicity such as fever or tachycardia and no history of MRSA    Plan:     -Recommend frequent elevation and avoid icing -Start Keflex 500 mg 4 times a day -Follow-up in 2 days with primary to reassess and sooner as needed  Eulas Post MD [facility] at [facility]

## 2016-09-21 ENCOUNTER — Encounter: Payer: Self-pay | Admitting: Family Medicine

## 2016-09-21 ENCOUNTER — Ambulatory Visit (INDEPENDENT_AMBULATORY_CARE_PROVIDER_SITE_OTHER): Payer: Medicare HMO | Admitting: Family Medicine

## 2016-09-21 ENCOUNTER — Other Ambulatory Visit: Payer: Self-pay | Admitting: Family Medicine

## 2016-09-21 ENCOUNTER — Telehealth: Payer: Self-pay

## 2016-09-21 VITALS — BP 146/89 | HR 104 | Temp 98.5°F | Ht 62.0 in | Wt 183.0 lb

## 2016-09-21 DIAGNOSIS — M79644 Pain in right finger(s): Secondary | ICD-10-CM

## 2016-09-21 LAB — CBC WITH DIFFERENTIAL/PLATELET
BASOS ABS: 0.1 10*3/uL (ref 0.0–0.1)
Basophils Relative: 0.4 % (ref 0.0–3.0)
EOS PCT: 0.3 % (ref 0.0–5.0)
Eosinophils Absolute: 0 10*3/uL (ref 0.0–0.7)
HEMATOCRIT: 45.5 % (ref 36.0–46.0)
Hemoglobin: 15.1 g/dL — ABNORMAL HIGH (ref 12.0–15.0)
LYMPHS ABS: 2.8 10*3/uL (ref 0.7–4.0)
Lymphocytes Relative: 21.5 % (ref 12.0–46.0)
MCHC: 33.1 g/dL (ref 30.0–36.0)
MCV: 85.3 fl (ref 78.0–100.0)
MONOS PCT: 9.4 % (ref 3.0–12.0)
Monocytes Absolute: 1.2 10*3/uL — ABNORMAL HIGH (ref 0.1–1.0)
NEUTROS PCT: 68.4 % (ref 43.0–77.0)
Neutro Abs: 8.8 10*3/uL — ABNORMAL HIGH (ref 1.4–7.7)
Platelets: 258 10*3/uL (ref 150.0–400.0)
RBC: 5.33 Mil/uL — AB (ref 3.87–5.11)
RDW: 14.8 % (ref 11.5–15.5)
WBC: 12.9 10*3/uL — ABNORMAL HIGH (ref 4.0–10.5)

## 2016-09-21 LAB — URIC ACID: Uric Acid, Serum: 8.6 mg/dL — ABNORMAL HIGH (ref 2.4–7.0)

## 2016-09-21 MED ORDER — INDOMETHACIN 50 MG PO CAPS: 50.0000 mg | ORAL_CAPSULE | Freq: Three times a day (TID) | ORAL | 2 refills | Status: DC

## 2016-09-21 MED ORDER — ALLOPURINOL 100 MG PO TABS: 100.0000 mg | ORAL_TABLET | Freq: Every day | ORAL | 3 refills | Status: DC

## 2016-09-21 MED ORDER — DOXYCYCLINE HYCLATE 100 MG PO CAPS: 100.0000 mg | ORAL_CAPSULE | Freq: Two times a day (BID) | ORAL | 0 refills | Status: AC

## 2016-09-21 NOTE — Progress Notes (Signed)
Pre visit review using our clinic review tool, if applicable. No additional management support is needed unless otherwise documented below in the visit note. 

## 2016-09-21 NOTE — Progress Notes (Signed)
   Subjective:    Patient ID: [name], female    DOB: Nov 14, 1945, 71 y.o.   MRN: [id number]  HPI Here for 3 days of pain, swelling, and redness in the right index finger. She has never had this before. She was here 2 days ago and this was diagnosed as cellulitis. She was started on Keflex QID, but she is no better today. There was some red streaking up the forearm at first and this has resolved, but the finger has not changed. No fever. I asked about recent trauma and she does remember using this finger the day before this started to try to open a bottle of laundry detergent. This was difficult and she actually caused some pain in the finger that day.    Review of Systems  Constitutional: Negative.   Musculoskeletal: Positive for arthralgias and joint swelling.       Objective:   Physical Exam  Constitutional: She appears well-developed and well-nourished.  Musculoskeletal:  The PIP joint of the right index finger is red, warm, swollen, and extremely tender. No fluctuance. ROM is very limited.   Skin:  No streaking is seen in the hand or forearm          Assessment & Plan:  This is most consistent with gout. We will start her on Indomethacin 50 mg TID since she does not tolerate prednisone very well. Check a uric acid level and a CBC today. I think it is not likely to be a MRSA infection, but to cover for this possibility we will stop the Keflex and start on Doxycycline for 10 days.  Alysia Penna, MD

## 2016-09-21 NOTE — Telephone Encounter (Signed)
Received PA request from [facility] for Indomethacin. PA submitted & is pending. Key: Laurelyn Sickle

## 2016-09-24 NOTE — Telephone Encounter (Signed)
PA approved, form faxed back to pharmacy. 

## 2016-12-18 ENCOUNTER — Ambulatory Visit (INDEPENDENT_AMBULATORY_CARE_PROVIDER_SITE_OTHER)
Admission: RE | Admit: 2016-12-18 | Discharge: 2016-12-18 | Disposition: A | Discharge status: Home / Self Care | Payer: Medicare HMO | Source: Ambulatory Visit | Attending: Family Medicine | Admitting: Family Medicine

## 2016-12-18 DIAGNOSIS — E2839 Other primary ovarian failure: Secondary | ICD-10-CM

## 2017-01-06 ENCOUNTER — Other Ambulatory Visit: Payer: Self-pay | Admitting: Family Medicine

## 2017-01-25 DIAGNOSIS — H04123 Dry eye syndrome of bilateral lacrimal glands: Secondary | ICD-10-CM | POA: Diagnosis not present

## 2017-01-25 DIAGNOSIS — H3540 Unspecified peripheral retinal degeneration: Secondary | ICD-10-CM | POA: Diagnosis not present

## 2017-01-25 DIAGNOSIS — H40053 Ocular hypertension, bilateral: Secondary | ICD-10-CM | POA: Diagnosis not present

## 2017-01-25 DIAGNOSIS — H59813 Chorioretinal scars after surgery for detachment, bilateral: Secondary | ICD-10-CM | POA: Diagnosis not present

## 2017-01-25 DIAGNOSIS — H52223 Regular astigmatism, bilateral: Secondary | ICD-10-CM | POA: Diagnosis not present

## 2017-01-25 DIAGNOSIS — H25813 Combined forms of age-related cataract, bilateral: Secondary | ICD-10-CM | POA: Diagnosis not present

## 2017-01-25 DIAGNOSIS — H33303 Unspecified retinal break, bilateral: Secondary | ICD-10-CM | POA: Diagnosis not present

## 2017-01-25 DIAGNOSIS — H40059 Ocular hypertension, unspecified eye: Secondary | ICD-10-CM | POA: Diagnosis not present

## 2017-01-25 DIAGNOSIS — H40013 Open angle with borderline findings, low risk, bilateral: Secondary | ICD-10-CM | POA: Diagnosis not present

## 2017-01-25 DIAGNOSIS — H5213 Myopia, bilateral: Secondary | ICD-10-CM | POA: Diagnosis not present

## 2017-02-11 ENCOUNTER — Telehealth: Payer: Self-pay

## 2017-02-11 ENCOUNTER — Emergency Department (HOSPITAL_COMMUNITY)
Admission: EM | Admit: 2017-02-11 | Discharge: 2017-02-11 | Discharge status: Against Medical Advice | Payer: Medicare HMO | Attending: Emergency Medicine | Admitting: Emergency Medicine

## 2017-02-11 ENCOUNTER — Encounter (HOSPITAL_COMMUNITY): Payer: Self-pay

## 2017-02-11 ENCOUNTER — Emergency Department (HOSPITAL_COMMUNITY): Payer: Medicare HMO

## 2017-02-11 DIAGNOSIS — R079 Chest pain, unspecified: Secondary | ICD-10-CM | POA: Insufficient documentation

## 2017-02-11 DIAGNOSIS — Z5321 Procedure and treatment not carried out due to patient leaving prior to being seen by health care provider: Secondary | ICD-10-CM | POA: Insufficient documentation

## 2017-02-11 LAB — CBC
HCT: 43.6 % (ref 36.0–46.0)
Hemoglobin: 14.2 g/dL (ref 12.0–15.0)
MCH: 28.1 pg (ref 26.0–34.0)
MCHC: 32.6 g/dL (ref 30.0–36.0)
MCV: 86.3 fL (ref 78.0–100.0)
PLATELETS: 216 10*3/uL (ref 150–400)
RBC: 5.05 MIL/uL (ref 3.87–5.11)
RDW: 14.5 % (ref 11.5–15.5)
WBC: 8.2 10*3/uL (ref 4.0–10.5)

## 2017-02-11 LAB — BASIC METABOLIC PANEL WITH GFR
Anion gap: 10 (ref 5–15)
BUN: 15 mg/dL (ref 6–20)
CO2: 28 mmol/L (ref 22–32)
Calcium: 9.6 mg/dL (ref 8.9–10.3)
Chloride: 101 mmol/L (ref 101–111)
Creatinine, Ser: 0.83 mg/dL (ref 0.44–1.00)
GFR calc Af Amer: >60 mL/min
GFR calc non Af Amer: >60 mL/min
Glucose, Bld: 96 mg/dL (ref 65–99)
Potassium: 4 mmol/L (ref 3.5–5.1)
Sodium: 139 mmol/L (ref 135–145)

## 2017-02-11 LAB — I-STAT TROPONIN, ED: TROPONIN I, POC: 0 ng/mL (ref 0.00–0.08)

## 2017-02-11 NOTE — ED Notes (Signed)
Pt no longer willing to wait to be seen.

## 2017-02-11 NOTE — Telephone Encounter (Signed)
Patient called to report that she has some "concerning symptoms". She states that yesterday after church she took a nap, when she woke up she felt "dizzy/woozy". That did subside over time. She states that today she had a sharp pain in her central chest area that radiated to her bilateral shoulders and upper back. She also reports that she felt like she had to take a deep breath during this time. She states that the episode lasted less than 1 minute and she "feels fine now". She would like to know what Dr. Sarajane Jews would recommend.  Per Dr. Sarajane Jews - He recommends she go to [facility] ED for evaluation. Explained this to pt and that due to her symptoms she needs to be evaluated ASAP. She is very hesitant to go and is afraid of a "huge bill"... Again explained to pt that this is the best way to be able to determine if her symptoms are cardiac related or not. She agrees to go. Dr. Sarajane Jews aware. Nothing further needed at this time.

## 2017-02-11 NOTE — ED Triage Notes (Signed)
Pt endorses central chest pain with radiation to the back between the shoulder blades that began at 1630 and lasted approximately one minute and went away. Pt states that she felt "woozy" yesterday and called her dr and was sent here. Hypertensive in triage, denies pain at this time.

## 2017-02-12 NOTE — Telephone Encounter (Signed)
Spoke with pt and she states that she has noticed her BP has been running high for several days. Last reading was 153/87. She wonders if this may be contributing to her symptoms. She states that ED did get labs and EKG done. She would like you to review these and see what recommendations you may have.   Dr. Sarajane Jews - Please advise.Thanks!

## 2017-02-12 NOTE — Telephone Encounter (Signed)
Pt left ED without being seen.   LMTCB

## 2017-02-12 NOTE — Telephone Encounter (Signed)
Have her make an OV with me to review the test results with her and for me to evaluate her symptoms

## 2017-02-12 NOTE — Telephone Encounter (Signed)
LMTCB

## 2017-02-14 NOTE — Telephone Encounter (Signed)
Spoke with pt and she is scheduled to see Dr. Sarajane Jews tomorrow. Nothing further needed at this time.

## 2017-02-15 ENCOUNTER — Encounter: Payer: Self-pay | Admitting: Family Medicine

## 2017-02-15 ENCOUNTER — Ambulatory Visit (INDEPENDENT_AMBULATORY_CARE_PROVIDER_SITE_OTHER): Payer: Medicare HMO | Admitting: Family Medicine

## 2017-02-15 VITALS — BP 136/82 | HR 79 | Temp 98.2°F | Ht 62.0 in | Wt 169.8 lb

## 2017-02-15 DIAGNOSIS — R0789 Other chest pain: Secondary | ICD-10-CM

## 2017-02-15 NOTE — Progress Notes (Signed)
   Subjective:    Patient ID: [name], female    DOB: 04-20-1946, 71 y.o.   MRN: [id number]  HPI Here to follow up an ER visit on 02-11-17 for an episode of chest pain. She had vital signs performed that day and had labs drawn, but she left the ER without being seen by a doctor because she got tired of waiting. This episode was brief, lasting only 4-5 seconds and it went away. The pain was not severe and it was sharp in quality. No SOB or sweats or nasuea. She has felt fine ever since. No coughing or fever. Her CXR, EKG, and labs were all normal.    Review of Systems  Constitutional: Negative.   HENT: Negative.   Eyes: Negative.   Respiratory: Negative.   Cardiovascular: Positive for chest pain. Negative for palpitations and leg swelling.  Gastrointestinal: Negative.   Neurological: Negative.        Objective:   Physical Exam  Constitutional: She appears well-developed and well-nourished. No distress.  Neck: No thyromegaly present.  Cardiovascular: Normal rate, regular rhythm, normal heart sounds and intact distal pulses.   Pulmonary/Chest: Effort normal and breath sounds normal. No respiratory distress. She has no wheezes. She has no rales. She exhibits no tenderness.  Abdominal: Soft. Bowel sounds are normal. She exhibits no distension and no mass. There is no tenderness. There is no rebound and no guarding.  Lymphadenopathy:    She has no cervical adenopathy.          Assessment & Plan:  Brief episode of chest pain, likely related to costochondritis. She will observe and return if these pains return.  Alysia Penna, MD

## 2017-02-18 ENCOUNTER — Other Ambulatory Visit: Payer: Self-pay | Admitting: Family Medicine

## 2017-02-20 ENCOUNTER — Other Ambulatory Visit: Payer: Self-pay | Admitting: Family Medicine

## 2017-02-21 NOTE — Telephone Encounter (Signed)
Rx has already been refilled.

## 2017-03-13 DIAGNOSIS — H401111 Primary open-angle glaucoma, right eye, mild stage: Secondary | ICD-10-CM | POA: Diagnosis not present

## 2017-03-13 DIAGNOSIS — H401121 Primary open-angle glaucoma, left eye, mild stage: Secondary | ICD-10-CM | POA: Diagnosis not present

## 2017-03-13 DIAGNOSIS — H40012 Open angle with borderline findings, low risk, left eye: Secondary | ICD-10-CM | POA: Diagnosis not present

## 2017-04-04 ENCOUNTER — Encounter: Payer: Self-pay | Admitting: Family Medicine

## 2017-04-10 DIAGNOSIS — L821 Other seborrheic keratosis: Secondary | ICD-10-CM | POA: Diagnosis not present

## 2017-04-10 DIAGNOSIS — X32XXXA Exposure to sunlight, initial encounter: Secondary | ICD-10-CM | POA: Diagnosis not present

## 2017-04-10 DIAGNOSIS — L570 Actinic keratosis: Secondary | ICD-10-CM | POA: Diagnosis not present

## 2017-04-10 DIAGNOSIS — L0202 Furuncle of face: Secondary | ICD-10-CM | POA: Diagnosis not present

## 2017-04-11 ENCOUNTER — Other Ambulatory Visit: Payer: Self-pay | Admitting: Family Medicine

## 2017-04-15 DIAGNOSIS — R69 Illness, unspecified: Secondary | ICD-10-CM | POA: Diagnosis not present

## 2017-04-15 DIAGNOSIS — Z0101 Encounter for examination of eyes and vision with abnormal findings: Secondary | ICD-10-CM | POA: Diagnosis not present

## 2017-04-23 DIAGNOSIS — Z1231 Encounter for screening mammogram for malignant neoplasm of breast: Secondary | ICD-10-CM | POA: Diagnosis not present

## 2017-05-09 DIAGNOSIS — H40053 Ocular hypertension, bilateral: Secondary | ICD-10-CM | POA: Diagnosis not present

## 2017-06-04 DIAGNOSIS — L570 Actinic keratosis: Secondary | ICD-10-CM | POA: Diagnosis not present

## 2017-06-04 DIAGNOSIS — X32XXXD Exposure to sunlight, subsequent encounter: Secondary | ICD-10-CM | POA: Diagnosis not present

## 2017-08-01 DIAGNOSIS — H40013 Open angle with borderline findings, low risk, bilateral: Secondary | ICD-10-CM | POA: Diagnosis not present

## 2017-08-01 DIAGNOSIS — H40053 Ocular hypertension, bilateral: Secondary | ICD-10-CM | POA: Diagnosis not present

## 2017-08-19 ENCOUNTER — Other Ambulatory Visit: Payer: Self-pay | Admitting: Family Medicine

## 2017-10-10 ENCOUNTER — Other Ambulatory Visit: Payer: Self-pay | Admitting: Family Medicine

## 2017-11-11 ENCOUNTER — Ambulatory Visit (INDEPENDENT_AMBULATORY_CARE_PROVIDER_SITE_OTHER): Payer: Medicare HMO | Admitting: Family Medicine

## 2017-11-11 ENCOUNTER — Encounter: Payer: Self-pay | Admitting: Family Medicine

## 2017-11-11 VITALS — BP 140/80 | HR 68 | Temp 98.0°F | Ht 62.0 in | Wt 166.6 lb

## 2017-11-11 DIAGNOSIS — L989 Disorder of the skin and subcutaneous tissue, unspecified: Secondary | ICD-10-CM

## 2017-11-12 ENCOUNTER — Encounter: Payer: Self-pay | Admitting: Family Medicine

## 2017-11-12 NOTE — Progress Notes (Signed)
   Subjective:    Patient ID: [name], female    DOB: [birth year], 72 y.o.   MRN: [id number]  HPI Here asking me to check the skin on her face. She saw Dr. Allyn Kenner for dermatology checks in September and November, and each time he treated both cheeks and temples with cryotherapy. Apparently this was for some precancerous lesions. Since then these areas have itched or burned off and on even though she cannot see anything. She does use sun protection.    Review of Systems  Constitutional: Negative.   Respiratory: Negative.   Cardiovascular: Negative.   Neurological: Negative.        Objective:   Physical Exam  Constitutional: She appears well-developed and well-nourished.  Cardiovascular: Normal rate, regular rhythm, normal heart sounds and intact distal pulses.  Pulmonary/Chest: Effort normal and breath sounds normal.  Skin:  The skin on her entire face appears normal           Assessment & Plan:  She complains of itching and burning on the face with no visible skin lesions. I suggested she return to see Dr. Nevada Crane and have him examine her skin. Use moisturizing lotions as needed.  Alysia Penna, MD

## 2017-11-19 ENCOUNTER — Other Ambulatory Visit: Payer: Self-pay | Admitting: Family Medicine

## 2017-12-13 DIAGNOSIS — H40013 Open angle with borderline findings, low risk, bilateral: Secondary | ICD-10-CM | POA: Diagnosis not present

## 2017-12-13 DIAGNOSIS — H40053 Ocular hypertension, bilateral: Secondary | ICD-10-CM | POA: Diagnosis not present

## 2017-12-13 DIAGNOSIS — H59812 Chorioretinal scars after surgery for detachment, left eye: Secondary | ICD-10-CM | POA: Diagnosis not present

## 2017-12-13 DIAGNOSIS — H52223 Regular astigmatism, bilateral: Secondary | ICD-10-CM | POA: Diagnosis not present

## 2017-12-13 DIAGNOSIS — H33303 Unspecified retinal break, bilateral: Secondary | ICD-10-CM | POA: Diagnosis not present

## 2017-12-13 DIAGNOSIS — H348312 Tributary (branch) retinal vein occlusion, right eye, stable: Secondary | ICD-10-CM | POA: Diagnosis not present

## 2017-12-13 DIAGNOSIS — H5213 Myopia, bilateral: Secondary | ICD-10-CM | POA: Diagnosis not present

## 2018-02-21 ENCOUNTER — Ambulatory Visit: Payer: Self-pay

## 2018-02-21 NOTE — Telephone Encounter (Signed)
Pt. Reports she started noticing a rash that is blisters that comes and goes.Has had these blisters on her upper thighs and trunk. They are itchy, not painful. Curious if it could be shingles. Had shingles vaccine in 2012. Instructed pt. To ask about the new shingles vaccine. No areas noted today. No new medications,foods,soaps,ect. Appointment made with her provider. Instructed if symptoms worsen over the weekend to go to ED.  Reason for Disposition . Localized rash present > 7 days  Answer Assessment - Initial Assessment Questions 1. APPEARANCE of RASH: "Describe the rash."      Blister bumps 2. LOCATION: "Where is the rash located?"      Upper thighs and trunk 3. NUMBER: "How many spots are there?"      Sometimes 1-3 4. SIZE: "How big are the spots?" (Inches, centimeters or compare to size of a coin)      Nickel size 5. ONSET: "When did the rash start?"      Noticed July 12 6. ITCHING: "Does the rash itch?" If so, ask: "How bad is the itch?"  (Scale 1-10; or mild, moderate, severe)     Itchy 7. PAIN: "Does the rash hurt?" If so, ask: "How bad is the pain?"  (Scale 1-10; or mild, moderate, severe)     No 8. OTHER SYMPTOMS: "Do you have any other symptoms?" (e.g., fever)      No 9. PREGNANCY: "Is there any chance you are pregnant?" "When was your last menstrual period?"     No  Protocols used: RASH OR REDNESS - LOCALIZED-A-AH

## 2018-02-25 ENCOUNTER — Ambulatory Visit (INDEPENDENT_AMBULATORY_CARE_PROVIDER_SITE_OTHER): Payer: Medicare HMO | Admitting: Family Medicine

## 2018-02-25 ENCOUNTER — Encounter: Payer: Self-pay | Admitting: Family Medicine

## 2018-02-25 VITALS — BP 180/100 | HR 74 | Temp 98.1°F | Ht 62.0 in | Wt 165.0 lb

## 2018-02-25 DIAGNOSIS — L509 Urticaria, unspecified: Secondary | ICD-10-CM | POA: Diagnosis not present

## 2018-02-25 MED ORDER — ENALAPRIL MALEATE 20 MG PO TABS: 20.0000 mg | ORAL_TABLET | Freq: Every day | ORAL | 3 refills | Status: DC

## 2018-02-26 ENCOUNTER — Encounter: Payer: Self-pay | Admitting: Family Medicine

## 2018-02-26 NOTE — Progress Notes (Signed)
   Subjective:    Patient ID: [name], female    DOB: 08/21/1945, 72 y.o.   MRN: [id number]  HPI Here for one month of intermittent itchy rashes on the abdomen and legs. These start as small bumps pr blisters and then they enlarge. They can come and go in a matter of hours. No SOB. No recent changes in medications, soaps, detergents, etc. Benadryl helps a little.   Review of Systems  Constitutional: Negative.   Respiratory: Negative.   Cardiovascular: Negative.   Skin: Positive for rash.  Neurological: Negative.        Objective:   Physical Exam  Constitutional: She is oriented to person, place, and time. She appears well-developed and well-nourished.  Cardiovascular: Normal rate, regular rhythm, normal heart sounds and intact distal pulses.  Pulmonary/Chest: Effort normal and breath sounds normal.  Neurological: She is alert and oriented to person, place, and time.  Skin:  At the moment her skin is clear           Assessment & Plan:  This sounds like classic hives. She will try Zantac 150 mg bid and Zyrtec 10 mg bid for several weeks. If this is not successful, we will try prednisone.  Alysia Penna, MD

## 2018-02-27 ENCOUNTER — Telehealth: Payer: Self-pay | Admitting: Family Medicine

## 2018-02-27 MED ORDER — ENALAPRIL MALEATE 20 MG PO TABS: 20.0000 mg | ORAL_TABLET | Freq: Every day | ORAL | 3 refills | Status: DC

## 2018-02-27 NOTE — Telephone Encounter (Signed)
Pts husband is in the office today needing the ealapril (VASOTEC) 20MG  it was sent to the incorrect drugstore [facility] [address] (on [address] of [address]  Pt need it to be called in as soon as possible due to her getting ready to run out.

## 2018-02-27 NOTE — Telephone Encounter (Signed)
Prescription has been sent to requested pharmacy. Called [facility] to cancel the current Rx that was sent.

## 2018-02-28 NOTE — Telephone Encounter (Signed)
Called and confirmed pharmacy has Rx

## 2018-03-20 DIAGNOSIS — R69 Illness, unspecified: Secondary | ICD-10-CM | POA: Diagnosis not present

## 2018-04-16 ENCOUNTER — Other Ambulatory Visit: Payer: Self-pay | Admitting: Family Medicine

## 2018-04-22 ENCOUNTER — Other Ambulatory Visit: Payer: Self-pay | Admitting: Family Medicine

## 2018-05-02 ENCOUNTER — Encounter: Payer: Self-pay | Admitting: Family Medicine

## 2018-05-02 ENCOUNTER — Ambulatory Visit (INDEPENDENT_AMBULATORY_CARE_PROVIDER_SITE_OTHER): Payer: Medicare HMO | Admitting: Family Medicine

## 2018-05-02 VITALS — BP 140/82 | HR 65 | Temp 98.0°F | Wt 166.4 lb

## 2018-05-02 DIAGNOSIS — G5601 Carpal tunnel syndrome, right upper limb: Secondary | ICD-10-CM | POA: Diagnosis not present

## 2018-05-02 DIAGNOSIS — G5603 Carpal tunnel syndrome, bilateral upper limbs: Secondary | ICD-10-CM

## 2018-05-02 DIAGNOSIS — Z23 Encounter for immunization: Secondary | ICD-10-CM | POA: Diagnosis not present

## 2018-05-02 NOTE — Progress Notes (Signed)
   Subjective:    Patient ID: [name], female    DOB: 30-Jul-1945, 72 y.o.   MRN: [id number]  HPI Here for 3 weeks of intermittent swelling in the right wrist and hand, numbness and tingling in the hand, slight weakness in the hand, and a mild aching pain that runs from the hand up the right lower arm. No recent trauma. No neck pain.   Review of Systems  Constitutional: Negative.   Respiratory: Negative.   Cardiovascular: Negative.   Musculoskeletal: Positive for myalgias.  Neurological: Positive for weakness and numbness.       Objective:   Physical Exam  Constitutional: She appears well-developed and well-nourished.  Cardiovascular: Normal rate, regular rhythm, normal heart sounds and intact distal pulses.  Pulmonary/Chest: Effort normal and breath sounds normal.  Musculoskeletal:  Right wrist appears normal, no swelling or erythema or warmth. ROM of the wrist and fingers are full           Assessment & Plan:  Probable carpal tunnel syndrome. Given a wrist spint to wear 24 hours a day for several weeks. Take Aleve BID for several weeks. Recheck prn. Alysia Penna, MD

## 2018-05-22 ENCOUNTER — Telehealth: Payer: Self-pay | Admitting: *Deleted

## 2018-05-22 DIAGNOSIS — G5601 Carpal tunnel syndrome, right upper limb: Secondary | ICD-10-CM

## 2018-05-22 NOTE — Telephone Encounter (Signed)
Copied from [place] [id number]. Topic: Referral - Request for Referral >> May 22, 2018 11:38 AM Scherrie Gerlach wrote: Has patient seen PCP for this complaint? yes Pt states her possible carpel tunnel is not better.  Pt states it is worse at night and she is having pain. Pt requesting to know if Dr Sarajane Jews wants to refer her to a specialist? Otr see him again for this?  referral to [facility]??  (dr Fredna Dow) or Dr Burney Gauze?) Or whoever Dr Sarajane Jews suggest?

## 2018-05-26 NOTE — Telephone Encounter (Signed)
The first step is to evaluate the extent of the problem, so I will refer her for a nerve conduction study soon.

## 2018-05-26 NOTE — Telephone Encounter (Signed)
Pt calling back stating that her rt hand really hurt at night feeling stinging sensation in wrist this has been going on a week before her Oct18th appointment and now her lt hand feel like its going to sleep  Please give pt a call back at [phone]

## 2018-05-26 NOTE — Telephone Encounter (Signed)
Patient has verbally agreed to a nerve conduction study

## 2018-05-28 NOTE — Telephone Encounter (Signed)
Patient called back regardingnerve conduction study stated that she was told someone would call her to schedule an appointment and she is waiting and haven't heard anything. She requested that [name] give her a call today please at.   Ph# [phone])

## 2018-05-29 NOTE — Telephone Encounter (Signed)
Pt called again asking for someone to call her back with location and phone # for nerve conduction study. She states she has been waiting since Monday. If no answer she wants a detailed msg to be left on answering machine. They screen calls because of having so many robo-calls. Ph # [phone].

## 2018-05-29 NOTE — Telephone Encounter (Signed)
I ordered this study on 05-26-18

## 2018-05-30 ENCOUNTER — Other Ambulatory Visit: Payer: Self-pay

## 2018-05-30 DIAGNOSIS — G5602 Carpal tunnel syndrome, left upper limb: Secondary | ICD-10-CM

## 2018-05-30 DIAGNOSIS — G5601 Carpal tunnel syndrome, right upper limb: Secondary | ICD-10-CM

## 2018-05-30 NOTE — Telephone Encounter (Signed)
Order for NCS changed to referral per LBN.

## 2018-06-02 ENCOUNTER — Encounter: Payer: Self-pay | Admitting: Neurology

## 2018-06-03 ENCOUNTER — Other Ambulatory Visit: Payer: Self-pay | Admitting: *Deleted

## 2018-06-03 DIAGNOSIS — G5601 Carpal tunnel syndrome, right upper limb: Secondary | ICD-10-CM

## 2018-06-03 NOTE — Progress Notes (Unsigned)
e

## 2018-06-09 DIAGNOSIS — M79641 Pain in right hand: Secondary | ICD-10-CM | POA: Diagnosis not present

## 2018-06-09 DIAGNOSIS — M180 Bilateral primary osteoarthritis of first carpometacarpal joints: Secondary | ICD-10-CM | POA: Diagnosis not present

## 2018-06-09 DIAGNOSIS — R200 Anesthesia of skin: Secondary | ICD-10-CM | POA: Diagnosis not present

## 2018-06-09 DIAGNOSIS — M79642 Pain in left hand: Secondary | ICD-10-CM | POA: Diagnosis not present

## 2018-07-07 ENCOUNTER — Telehealth: Payer: Self-pay | Admitting: Neurology

## 2018-07-07 ENCOUNTER — Ambulatory Visit (INDEPENDENT_AMBULATORY_CARE_PROVIDER_SITE_OTHER): Payer: Medicare HMO | Admitting: Neurology

## 2018-07-07 DIAGNOSIS — G5601 Carpal tunnel syndrome, right upper limb: Secondary | ICD-10-CM | POA: Diagnosis not present

## 2018-07-07 DIAGNOSIS — G5603 Carpal tunnel syndrome, bilateral upper limbs: Secondary | ICD-10-CM

## 2018-07-07 NOTE — Telephone Encounter (Signed)
Patient is requesting that her results be faxed over to Dr. Sarajane Jews as well as Dr. Daryll Brod. I did let her know that the Referring Doctor would give her results. Thanks

## 2018-07-07 NOTE — Procedures (Signed)
[facility]  [address] [place]  [place], [place] [address] Tel: [phone] Fax:  [phone] Test Date:  07/07/2018  Patient: [name] DOB: [birth year] Physician: Narda Amber, DO  Sex: Female Height: 5\' 2"  Ref Phys: Alysia Penna, MD  ID#: [id number] Temp: 36.0C Technician:    Patient Complaints: This is a 72 year old female referred for evaluation of bilateral hand paresthesias and pain.  NCV & EMG Findings: Extensive electrodiagnostic testing of the right upper extremity and additional studies of the left shows:  1. Bilateral median sensory responses show prolonged latency and right amplitude is asymmetrically reduced (R5.0, L5.2 ms).  Bilateral ulnar sensory responses are within normal limits. 2. Bilateral median motor responses show prolonged latencies (R4.5, L5.5 ms).  Bilateral ulnar motor responses are within normal limits. 3. There is no evidence of active or chronic motor axonal loss changes affecting any of the tested muscles.  Motor unit configuration and recruitment pattern is within normal limits.  Impression: Bilateral median neuropathy at or distal to the wrist, consistent with a clinical diagnosis of carpal tunnel syndrome.  Overall, these findings are moderate in degree electrically.   ___________________________ Narda Amber, DO    Nerve Conduction Studies Anti Sensory Summary Table   Site NR Peak (ms) Norm Peak (ms) P-T Amp (V) Norm P-T Amp  Left Median Anti Sensory (2nd Digit)  36C  Wrist    5.2 <3.8 18.9 >10  Right Median Anti Sensory (2nd Digit)  36C  Wrist    5.0 <3.8 11.5 >10  Left Ulnar Anti Sensory (5th Digit)  36C  Wrist    3.0 <3.2 30.9 >5  Right Ulnar Anti Sensory (5th Digit)  36C  Wrist    2.4 <3.2 26.5 >5   Motor Summary Table   Site NR Onset (ms) Norm Onset (ms) O-P Amp (mV) Norm O-P Amp Site1 Site2 Delta-0 (ms) Dist (cm) Vel (m/s) Norm Vel (m/s)  Left Median Motor (Abd Poll Brev)  36C  Wrist    5.5 <4.0  6.3 >5 Elbow Wrist 4.8 26.0 54 >50  Elbow    10.3  6.2         Right Median Motor (Abd Poll Brev)  36C  Wrist    4.5 <4.0 6.0 >5 Elbow Wrist 4.8 27.0 56 >50  Elbow    9.3  5.8         Left Ulnar Motor (Abd Dig Minimi)  36C  Wrist    2.5 <3.1 9.3 >7 B Elbow Wrist 3.4 23.0 68 >50  B Elbow    5.9  9.1  A Elbow B Elbow 1.9 10.0 58 >50  A Elbow    7.8  8.5         Right Ulnar Motor (Abd Dig Minimi)  36C  Wrist    2.1 <3.1 9.0 >7 B Elbow Wrist 3.7 23.0 62 >50  B Elbow    5.8  8.9  A Elbow B Elbow 1.8 10.0 56 >50  A Elbow    7.6  8.3          EMG   Side Muscle Ins Act Fibs Psw Fasc Number Recrt Dur Dur. Amp Amp. Poly Poly. Comment  Right 1stDorInt Nml Nml Nml Nml Nml Nml Nml Nml Nml Nml Nml Nml N/A  Right PronatorTeres Nml Nml Nml Nml Nml Nml Nml Nml Nml Nml Nml Nml N/A  Right Abd Poll Brev Nml Nml Nml Nml Nml Nml Nml Nml Nml Nml Nml Nml N/A  Right  Biceps Nml Nml Nml Nml Nml Nml Nml Nml Nml Nml Nml Nml N/A  Right Triceps Nml Nml Nml Nml Nml Nml Nml Nml Nml Nml Nml Nml N/A  Right Deltoid Nml Nml Nml Nml Nml Nml Nml Nml Nml Nml Nml Nml N/A  Left Triceps Nml Nml Nml Nml Nml Nml Nml Nml Nml Nml Nml Nml N/A  Left Deltoid Nml Nml Nml Nml Nml Nml Nml Nml Nml Nml Nml Nml N/A  Left 1stDorInt Nml Nml Nml Nml Nml Nml Nml Nml Nml Nml Nml Nml N/A  Left Abd Poll Brev Nml Nml Nml Nml Nml Nml Nml Nml Nml Nml Nml Nml N/A  Left PronatorTeres Nml Nml Nml Nml Nml Nml Nml Nml Nml Nml Nml Nml N/A  Left Biceps Nml Nml Nml Nml Nml Nml Nml Nml Nml Nml Nml Nml N/A      Waveforms:

## 2018-07-08 ENCOUNTER — Encounter: Payer: Medicare HMO | Admitting: Neurology

## 2018-07-08 ENCOUNTER — Encounter

## 2018-07-08 NOTE — Telephone Encounter (Signed)
Results faxed.

## 2018-07-10 ENCOUNTER — Encounter: Payer: Self-pay | Admitting: *Deleted

## 2018-07-10 NOTE — Addendum Note (Signed)
Addended by: Alysia Penna A on: 07/10/2018 03:06 PM   Modules accepted: Orders

## 2018-07-14 ENCOUNTER — Other Ambulatory Visit: Payer: Self-pay | Admitting: Family Medicine

## 2018-07-14 DIAGNOSIS — G5603 Carpal tunnel syndrome, bilateral upper limbs: Secondary | ICD-10-CM | POA: Diagnosis not present

## 2018-08-11 ENCOUNTER — Ambulatory Visit (INDEPENDENT_AMBULATORY_CARE_PROVIDER_SITE_OTHER): Payer: Medicare HMO | Admitting: Family Medicine

## 2018-08-11 ENCOUNTER — Encounter: Payer: Self-pay | Admitting: Family Medicine

## 2018-08-11 VITALS — BP 136/78 | HR 82 | Temp 98.0°F | Wt 165.0 lb

## 2018-08-11 DIAGNOSIS — R531 Weakness: Secondary | ICD-10-CM | POA: Diagnosis not present

## 2018-08-11 DIAGNOSIS — G5603 Carpal tunnel syndrome, bilateral upper limbs: Secondary | ICD-10-CM | POA: Insufficient documentation

## 2018-08-11 DIAGNOSIS — I10 Essential (primary) hypertension: Secondary | ICD-10-CM

## 2018-08-11 LAB — BASIC METABOLIC PANEL
BUN: 17 mg/dL (ref 6–23)
CALCIUM: 9.4 mg/dL (ref 8.4–10.5)
CHLORIDE: 100 meq/L (ref 96–112)
CO2: 29 meq/L (ref 19–32)
CREATININE: 0.79 mg/dL (ref 0.40–1.20)
GFR: 71.51 mL/min (ref >60.00)
GLUCOSE: 92 mg/dL (ref 70–99)
Potassium: 3.6 mEq/L (ref 3.5–5.1)
Sodium: 139 mEq/L (ref 135–145)

## 2018-08-11 LAB — CBC WITH DIFFERENTIAL/PLATELET
BASOS ABS: 0.1 10*3/uL (ref 0.0–0.1)
Basophils Relative: 0.6 % (ref 0.0–3.0)
EOS PCT: 0.7 % (ref 0.0–5.0)
Eosinophils Absolute: 0.1 10*3/uL (ref 0.0–0.7)
HCT: 44.4 % (ref 36.0–46.0)
HEMOGLOBIN: 14.8 g/dL (ref 12.0–15.0)
Lymphocytes Relative: 22.5 % (ref 12.0–46.0)
Lymphs Abs: 1.9 10*3/uL (ref 0.7–4.0)
MCHC: 33.4 g/dL (ref 30.0–36.0)
MCV: 86 fl (ref 78.0–100.0)
MONO ABS: 0.6 10*3/uL (ref 0.1–1.0)
MONOS PCT: 6.6 % (ref 3.0–12.0)
Neutro Abs: 5.9 10*3/uL (ref 1.4–7.7)
Neutrophils Relative %: 69.6 % (ref 43.0–77.0)
Platelets: 242 10*3/uL (ref 150.0–400.0)
RBC: 5.16 Mil/uL — AB (ref 3.87–5.11)
RDW: 14 % (ref 11.5–15.5)
WBC: 8.5 10*3/uL (ref 4.0–10.5)

## 2018-08-11 LAB — HEPATIC FUNCTION PANEL
ALT: 8 U/L (ref 0–35)
AST: 20 U/L (ref 0–37)
Albumin: 3.8 g/dL (ref 3.5–5.2)
Alkaline Phosphatase: 60 U/L (ref 39–117)
BILIRUBIN DIRECT: 0.1 mg/dL (ref 0.0–0.3)
Total Bilirubin: 0.6 mg/dL (ref 0.2–1.2)
Total Protein: 6.6 g/dL (ref 6.0–8.3)

## 2018-08-11 LAB — VITAMIN B12: VITAMIN B 12: 141 pg/mL — AB (ref 211–911)

## 2018-08-11 LAB — SEDIMENTATION RATE: Sed Rate: 25 mm/hr (ref 0–30)

## 2018-08-11 LAB — CK: CK TOTAL: 34 U/L (ref 7–177)

## 2018-08-11 LAB — TSH: TSH: 1.92 u[IU]/mL (ref 0.35–4.50)

## 2018-08-11 NOTE — Progress Notes (Signed)
   Subjective:    Patient ID: [name], female    DOB: March 08, 1946, 73 y.o.   MRN: [id number]  HPI Here to follow up on carpal tunnel and for muscle weakness and low BP. She had nerve conduction tests on 07-07-18 which confirmed bilateral carpal tunnel and she saw Dr. Fredna Dow on 07-14-18 for this. He started her on Meloxicam and gave her Celestone injections to both wrists. The injections helped a fair amount with the pain and tingling in her hands, but she quickly developed weakness of her muscles, especially the large proximal muscles like thighs and hips. She had trouble standing up from sitting in a chair for instance. She also developed stiffness in the elbows, shoulders, and knees. No joint swelling. She stopped taking the Meloxicam one week ago and the weakness and stiffness is improving, though not back to normal. She also notes frequent low BP readings in the past few weeks, sometimes as low as 90s over 50s.    Review of Systems  Constitutional: Negative.   Respiratory: Negative.   Cardiovascular: Negative.   Musculoskeletal: Positive for arthralgias.  Neurological: Positive for weakness and numbness. Negative for tremors and speech difficulty.       Objective:   Physical Exam Constitutional:      General: She is not in acute distress.    Appearance: Normal appearance.  Neck:     Musculoskeletal: No neck rigidity.  Cardiovascular:     Rate and Rhythm: Normal rate and regular rhythm.     Pulses: Normal pulses.     Heart sounds: Normal heart sounds.  Pulmonary:     Effort: Pulmonary effort is normal.     Breath sounds: Normal breath sounds.  Musculoskeletal:        General: No swelling or tenderness.     Comments: Full ROM  Lymphadenopathy:     Cervical: No cervical adenopathy.  Neurological:     General: No focal deficit present.     Mental Status: She is alert and oriented to person, place, and time.     Cranial Nerves: No cranial nerve deficit.     Coordination:  Coordination normal.     Gait: Gait normal.           Assessment & Plan:  She does seem to have side effects from the Meloxicam so she sill stay off this. She may have hypokalemia or some other metabolic issue so we will get labs today to include a BMET. Check a CK and a ESR to look for things like PMR. Her BP has been running low so I advised her to stop taking HCTZ. She will follow up with Dr. Fredna Dow on 08-25-18. Alysia Penna, MD

## 2018-08-20 ENCOUNTER — Ambulatory Visit (INDEPENDENT_AMBULATORY_CARE_PROVIDER_SITE_OTHER): Payer: Medicare HMO | Admitting: *Deleted

## 2018-08-20 DIAGNOSIS — E538 Deficiency of other specified B group vitamins: Secondary | ICD-10-CM

## 2018-08-20 MED ORDER — CYANOCOBALAMIN 1000 MCG/ML IJ SOLN
1000.0000 ug | Freq: Once | INTRAMUSCULAR | Status: AC
  Administered 2018-08-20: 1000 ug via INTRAMUSCULAR

## 2018-08-20 NOTE — Progress Notes (Signed)
Per orders of Dr. Fry, injection of Cyanocobalamin 1000mcg given by Funderburk, Jo A. Patient tolerated injection well.  

## 2018-08-25 DIAGNOSIS — M19041 Primary osteoarthritis, right hand: Secondary | ICD-10-CM | POA: Diagnosis not present

## 2018-08-25 DIAGNOSIS — M19042 Primary osteoarthritis, left hand: Secondary | ICD-10-CM | POA: Diagnosis not present

## 2018-08-25 DIAGNOSIS — G5603 Carpal tunnel syndrome, bilateral upper limbs: Secondary | ICD-10-CM | POA: Diagnosis not present

## 2018-08-25 DIAGNOSIS — M180 Bilateral primary osteoarthritis of first carpometacarpal joints: Secondary | ICD-10-CM | POA: Diagnosis not present

## 2018-08-27 ENCOUNTER — Ambulatory Visit (INDEPENDENT_AMBULATORY_CARE_PROVIDER_SITE_OTHER): Payer: Medicare HMO | Admitting: *Deleted

## 2018-08-27 DIAGNOSIS — E538 Deficiency of other specified B group vitamins: Secondary | ICD-10-CM

## 2018-08-27 MED ORDER — CYANOCOBALAMIN 1000 MCG/ML IJ SOLN
1000.0000 ug | Freq: Once | INTRAMUSCULAR | Status: AC
  Administered 2018-08-27: 1000 ug via INTRAMUSCULAR

## 2018-08-27 NOTE — Progress Notes (Signed)
Per orders of Dr. Fry, injection of Vit B12 given by GREEN, ASHTYN M. Patient tolerated injection well.  

## 2018-09-02 ENCOUNTER — Ambulatory Visit (INDEPENDENT_AMBULATORY_CARE_PROVIDER_SITE_OTHER): Payer: Medicare HMO | Admitting: *Deleted

## 2018-09-02 DIAGNOSIS — E538 Deficiency of other specified B group vitamins: Secondary | ICD-10-CM

## 2018-09-02 MED ORDER — CYANOCOBALAMIN 1000 MCG/ML IJ SOLN
1000.0000 ug | Freq: Once | INTRAMUSCULAR | Status: AC
  Administered 2018-09-02: 1000 ug via INTRAMUSCULAR

## 2018-09-02 NOTE — Progress Notes (Signed)
Per orders of Dr. Fry, injection of Vit B12 given by Denyse Fillion M. Patient tolerated injection well.  

## 2018-09-09 ENCOUNTER — Ambulatory Visit (INDEPENDENT_AMBULATORY_CARE_PROVIDER_SITE_OTHER): Payer: Medicare HMO | Admitting: *Deleted

## 2018-09-09 DIAGNOSIS — E538 Deficiency of other specified B group vitamins: Secondary | ICD-10-CM

## 2018-09-09 MED ORDER — CYANOCOBALAMIN 1000 MCG/ML IJ SOLN
1000.0000 ug | Freq: Once | INTRAMUSCULAR | Status: AC
  Administered 2018-09-09: 1000 ug via INTRAMUSCULAR

## 2018-09-09 NOTE — Progress Notes (Signed)
Per orders of Dr. Fry, injection of Vit B12 given by Yeny Schmoll M. Patient tolerated injection well.  

## 2018-09-16 ENCOUNTER — Ambulatory Visit: Payer: Medicare HMO

## 2018-09-16 ENCOUNTER — Ambulatory Visit (INDEPENDENT_AMBULATORY_CARE_PROVIDER_SITE_OTHER): Payer: Medicare HMO | Admitting: *Deleted

## 2018-09-16 DIAGNOSIS — E538 Deficiency of other specified B group vitamins: Secondary | ICD-10-CM

## 2018-09-16 DIAGNOSIS — H6122 Impacted cerumen, left ear: Secondary | ICD-10-CM | POA: Diagnosis not present

## 2018-09-16 MED ORDER — CYANOCOBALAMIN 1000 MCG/ML IJ SOLN
1000.0000 ug | Freq: Once | INTRAMUSCULAR | Status: AC
  Administered 2018-09-16: 1000 ug via INTRAMUSCULAR

## 2018-09-16 NOTE — Progress Notes (Signed)
Patient here for B-12 injection. Injection administered and tolerated well.

## 2018-09-25 DIAGNOSIS — R69 Illness, unspecified: Secondary | ICD-10-CM | POA: Diagnosis not present

## 2018-10-02 ENCOUNTER — Telehealth: Payer: Self-pay | Admitting: *Deleted

## 2018-10-02 NOTE — Telephone Encounter (Signed)
Copied from [place] [phone]. Topic: Appointment Scheduling - Prior Auth Required for Appointment >> Sep 29, 2018  3:37 PM Vernona Rieger wrote: Patient is calling to schedule her B12. She is aware that the nurse schedule is blocked this week. Please call patient to schedule for next week

## 2018-10-02 NOTE — Telephone Encounter (Signed)
Pt has been scheduled.  °

## 2018-10-02 NOTE — Telephone Encounter (Signed)
Left message on machine for patient to call back to schedule B12 CRM

## 2018-10-06 ENCOUNTER — Telehealth: Payer: Self-pay

## 2018-10-06 MED ORDER — VITAMIN B-12 1000 MCG PO TABS: 1000.0000 ug | ORAL_TABLET | Freq: Every day | ORAL | 1 refills | Status: DC

## 2018-10-06 NOTE — Telephone Encounter (Signed)
Copied from [place] [id number]. Topic: Appointment Scheduling - Prior Auth Required for Appointment >> Sep 29, 2018  3:37 PM Vernona Rieger wrote: Patient is calling to schedule her B12. She is aware that the nurse schedule is blocked this week. Please call patient to schedule for next week

## 2018-10-06 NOTE — Telephone Encounter (Signed)
Dr. Sarajane Jews - Please advise on switch to oral B12. Thanks!

## 2018-10-06 NOTE — Telephone Encounter (Signed)
Called and spoke with pt she is aware of rx that has been sent to the pharmacy.

## 2018-10-06 NOTE — Telephone Encounter (Signed)
Switch the B12 to OTC tablets. Take 1000 mcg daily SL. Check a level in 90 days

## 2018-10-07 ENCOUNTER — Ambulatory Visit: Payer: Medicare HMO

## 2018-10-23 ENCOUNTER — Telehealth: Payer: Self-pay | Admitting: *Deleted

## 2018-10-23 DIAGNOSIS — E538 Deficiency of other specified B group vitamins: Secondary | ICD-10-CM

## 2018-10-23 NOTE — Telephone Encounter (Signed)
Dr Fry please advise. thanks 

## 2018-10-23 NOTE — Telephone Encounter (Signed)
Copied from [place] [phone]. Topic: General - Inquiry >> Oct 23, 2018  1:53 PM Richardo Priest, NT wrote: Reason for CRM: Patient is calling in stating she is still having some weakness and no energy. States it was from a past medication she took in January and already discussed with Dr.Fry. Requesting some advice and a call back at [phone]

## 2018-10-27 ENCOUNTER — Other Ambulatory Visit: Payer: Self-pay | Admitting: Family Medicine

## 2018-10-27 ENCOUNTER — Encounter: Payer: Self-pay | Admitting: Family Medicine

## 2018-10-27 ENCOUNTER — Telehealth: Payer: Self-pay | Admitting: Family Medicine

## 2018-10-27 DIAGNOSIS — E538 Deficiency of other specified B group vitamins: Secondary | ICD-10-CM

## 2018-10-27 DIAGNOSIS — M1990 Unspecified osteoarthritis, unspecified site: Secondary | ICD-10-CM

## 2018-10-27 NOTE — Telephone Encounter (Signed)
I have scheduled the pt to have lab work for B12 on 10/28/2018.  She would also like to have her uric acid checked.  Has been having all over joint pain.  Orders need to be entered for B12 and uric acid if agreeable.

## 2018-10-27 NOTE — Telephone Encounter (Signed)
As we noted when her lab work came back in January that her B12 was very low. I am sure most of the weakness was from that. She was to have taken shots for 12 weeks and then check a level. The level was never checked, so have her make a lab appt to check this again. I will put in the order

## 2018-10-27 NOTE — Telephone Encounter (Signed)
lmomtcb x 1 for the pt 

## 2018-10-27 NOTE — Progress Notes (Unsigned)
The orders were placed  ?

## 2018-10-27 NOTE — Telephone Encounter (Signed)
The orders were placed  ?

## 2018-10-28 ENCOUNTER — Other Ambulatory Visit: Payer: Self-pay

## 2018-10-28 ENCOUNTER — Other Ambulatory Visit (INDEPENDENT_AMBULATORY_CARE_PROVIDER_SITE_OTHER): Payer: Medicare HMO

## 2018-10-28 DIAGNOSIS — M1990 Unspecified osteoarthritis, unspecified site: Secondary | ICD-10-CM

## 2018-10-28 DIAGNOSIS — E538 Deficiency of other specified B group vitamins: Secondary | ICD-10-CM

## 2018-10-28 LAB — URIC ACID: Uric Acid, Serum: 6.4 mg/dL (ref 2.4–7.0)

## 2018-10-28 LAB — VITAMIN B12: Vitamin B-12: 679 pg/mL (ref 211–911)

## 2018-10-29 ENCOUNTER — Encounter: Payer: Self-pay | Admitting: *Deleted

## 2018-10-30 ENCOUNTER — Encounter (HOSPITAL_BASED_OUTPATIENT_CLINIC_OR_DEPARTMENT_OTHER): Payer: Self-pay | Admitting: Emergency Medicine

## 2018-10-30 ENCOUNTER — Emergency Department (HOSPITAL_BASED_OUTPATIENT_CLINIC_OR_DEPARTMENT_OTHER): Payer: Medicare HMO

## 2018-10-30 ENCOUNTER — Observation Stay (HOSPITAL_COMMUNITY): Payer: Medicare HMO

## 2018-10-30 ENCOUNTER — Observation Stay (HOSPITAL_BASED_OUTPATIENT_CLINIC_OR_DEPARTMENT_OTHER)
Admission: EM | Admit: 2018-10-30 | Discharge: 2018-10-31 | Disposition: A | Discharge status: Home / Self Care | Payer: Medicare HMO | Attending: Internal Medicine | Admitting: Internal Medicine

## 2018-10-30 ENCOUNTER — Other Ambulatory Visit: Payer: Self-pay

## 2018-10-30 ENCOUNTER — Ambulatory Visit: Payer: Self-pay | Admitting: *Deleted

## 2018-10-30 DIAGNOSIS — I252 Old myocardial infarction: Secondary | ICD-10-CM | POA: Insufficient documentation

## 2018-10-30 DIAGNOSIS — R079 Chest pain, unspecified: Secondary | ICD-10-CM | POA: Diagnosis not present

## 2018-10-30 DIAGNOSIS — R0602 Shortness of breath: Secondary | ICD-10-CM | POA: Diagnosis not present

## 2018-10-30 DIAGNOSIS — R270 Ataxia, unspecified: Secondary | ICD-10-CM | POA: Diagnosis not present

## 2018-10-30 DIAGNOSIS — R072 Precordial pain: Principal | ICD-10-CM | POA: Insufficient documentation

## 2018-10-30 DIAGNOSIS — I10 Essential (primary) hypertension: Secondary | ICD-10-CM | POA: Diagnosis present

## 2018-10-30 DIAGNOSIS — R0789 Other chest pain: Secondary | ICD-10-CM | POA: Diagnosis not present

## 2018-10-30 DIAGNOSIS — R42 Dizziness and giddiness: Secondary | ICD-10-CM | POA: Diagnosis not present

## 2018-10-30 DIAGNOSIS — E869 Volume depletion, unspecified: Secondary | ICD-10-CM | POA: Insufficient documentation

## 2018-10-30 DIAGNOSIS — E876 Hypokalemia: Secondary | ICD-10-CM | POA: Diagnosis not present

## 2018-10-30 LAB — CBC WITH DIFFERENTIAL/PLATELET
Abs Immature Granulocytes: 0.01 10*3/uL (ref 0.00–0.07)
Basophils Absolute: 0 10*3/uL (ref 0.0–0.1)
Basophils Relative: 1 %
Eosinophils Absolute: 0.1 10*3/uL (ref 0.0–0.5)
Eosinophils Relative: 1 %
HCT: 43.3 % (ref 36.0–46.0)
Hemoglobin: 14 g/dL (ref 12.0–15.0)
Immature Granulocytes: 0 %
Lymphocytes Relative: 31 %
Lymphs Abs: 2.3 10*3/uL (ref 0.7–4.0)
MCH: 28.5 pg (ref 26.0–34.0)
MCHC: 32.3 g/dL (ref 30.0–36.0)
MCV: 88.2 fL (ref 80.0–100.0)
Monocytes Absolute: 0.6 10*3/uL (ref 0.1–1.0)
Monocytes Relative: 8 %
Neutro Abs: 4.3 10*3/uL (ref 1.7–7.7)
Neutrophils Relative %: 59 %
Platelets: 231 10*3/uL (ref 150–400)
RBC: 4.91 MIL/uL (ref 3.87–5.11)
RDW: 14.6 % (ref 11.5–15.5)
WBC: 7.3 10*3/uL (ref 4.0–10.5)
nRBC: 0 % (ref 0.0–0.2)

## 2018-10-30 LAB — COMPREHENSIVE METABOLIC PANEL
ALT: 8 U/L (ref 0–44)
AST: 20 U/L (ref 15–41)
Albumin: 3.5 g/dL (ref 3.5–5.0)
Alkaline Phosphatase: 60 U/L (ref 38–126)
Anion gap: 10 (ref 5–15)
BUN: 15 mg/dL (ref 8–23)
CO2: 24 mmol/L (ref 22–32)
Calcium: 8.9 mg/dL (ref 8.9–10.3)
Chloride: 103 mmol/L (ref 98–111)
Creatinine, Ser: 0.69 mg/dL (ref 0.44–1.00)
GFR calc Af Amer: >60 mL/min (ref >60)
GFR calc non Af Amer: >60 mL/min (ref >60)
Glucose, Bld: 95 mg/dL (ref 70–99)
Potassium: 3.4 mmol/L — ABNORMAL LOW (ref 3.5–5.1)
Sodium: 137 mmol/L (ref 135–145)
Total Bilirubin: 0.9 mg/dL (ref 0.3–1.2)
Total Protein: 7 g/dL (ref 6.5–8.1)

## 2018-10-30 LAB — CBC
HCT: 42.1 % (ref 36.0–46.0)
Hemoglobin: 13.9 g/dL (ref 12.0–15.0)
MCH: 28.5 pg (ref 26.0–34.0)
MCHC: 33 g/dL (ref 30.0–36.0)
MCV: 86.4 fL (ref 80.0–100.0)
Platelets: 229 10*3/uL (ref 150–400)
RBC: 4.87 MIL/uL (ref 3.87–5.11)
RDW: 14.6 % (ref 11.5–15.5)
WBC: 7.4 10*3/uL (ref 4.0–10.5)
nRBC: 0 % (ref 0.0–0.2)

## 2018-10-30 LAB — URINALYSIS, ROUTINE W REFLEX MICROSCOPIC
Bilirubin Urine: NEGATIVE
Glucose, UA: NEGATIVE mg/dL
Hgb urine dipstick: NEGATIVE
Ketones, ur: NEGATIVE mg/dL
Nitrite: NEGATIVE
Protein, ur: NEGATIVE mg/dL
Specific Gravity, Urine: 1.025 (ref 1.005–1.030)
pH: 5.5 (ref 5.0–8.0)

## 2018-10-30 LAB — URINALYSIS, MICROSCOPIC (REFLEX)

## 2018-10-30 LAB — CREATININE, SERUM
Creatinine, Ser: 0.76 mg/dL (ref 0.44–1.00)
GFR calc Af Amer: >60 mL/min (ref >60)
GFR calc non Af Amer: >60 mL/min (ref >60)

## 2018-10-30 LAB — TROPONIN I
Troponin I: <0.03 ng/mL (ref <0.03)
Troponin I: <0.03 ng/mL (ref <0.03)

## 2018-10-30 LAB — BRAIN NATRIURETIC PEPTIDE: B Natriuretic Peptide: 63 pg/mL (ref 0.0–100.0)

## 2018-10-30 MED ORDER — MECLIZINE HCL 25 MG PO TABS: 12.5000 mg | ORAL_TABLET | Freq: Three times a day (TID) | ORAL | Status: DC | PRN

## 2018-10-30 MED ORDER — VITAMIN B-12 1000 MCG PO TABS
1000.0000 ug | ORAL_TABLET | Freq: Every day | ORAL | Status: DC
  Administered 2018-10-31: 1000 ug via ORAL
  Filled 2018-10-30: qty 1

## 2018-10-30 MED ORDER — ASPIRIN 81 MG PO CHEW
324.0000 mg | CHEWABLE_TABLET | Freq: Once | ORAL | Status: AC
  Administered 2018-10-30: 324 mg via ORAL
  Filled 2018-10-30: qty 4

## 2018-10-30 MED ORDER — POTASSIUM CHLORIDE CRYS ER 20 MEQ PO TBCR
40.0000 meq | EXTENDED_RELEASE_TABLET | Freq: Once | ORAL | Status: AC
  Administered 2018-10-30: 40 meq via ORAL
  Filled 2018-10-30: qty 2

## 2018-10-30 MED ORDER — ENOXAPARIN SODIUM 40 MG/0.4ML ~~LOC~~ SOLN
40.0000 mg | SUBCUTANEOUS | Status: DC
  Administered 2018-10-30: 40 mg via SUBCUTANEOUS
  Filled 2018-10-30: qty 0.4

## 2018-10-30 MED ORDER — ASPIRIN 325 MG PO TABS
325.0000 mg | ORAL_TABLET | Freq: Every day | ORAL | Status: DC
  Administered 2018-10-31: 09:00:00 325 mg via ORAL
  Filled 2018-10-30: qty 1

## 2018-10-30 MED ORDER — ENALAPRIL MALEATE 20 MG PO TABS
10.0000 mg | ORAL_TABLET | Freq: Two times a day (BID) | ORAL | Status: DC
  Administered 2018-10-31: 09:00:00 10 mg via ORAL
  Filled 2018-10-30: qty 1

## 2018-10-30 MED ORDER — NITROGLYCERIN 0.4 MG SL SUBL
0.4000 mg | SUBLINGUAL_TABLET | Freq: Once | SUBLINGUAL | Status: DC
  Filled 2018-10-30: qty 1

## 2018-10-30 MED ORDER — POTASSIUM CHLORIDE IN NACL 20-0.9 MEQ/L-% IV SOLN
INTRAVENOUS | Status: DC
  Administered 2018-10-30: 21:00:00 via INTRAVENOUS
  Filled 2018-10-30 (×2): qty 1000

## 2018-10-30 NOTE — ED Notes (Addendum)
C/o waking last pm dizzy,  Felt like room was spinning,  Also when tried to sit up in bed felt like was being pushed back down   States chest feels strained,  Denies cp

## 2018-10-30 NOTE — Telephone Encounter (Signed)
Pt reports she was sitting on edge of bed last night 2100 and "Kept falling to the left." States occurred during night as well. States this AM "Felt like someone was holding me down."  Reports dizziness, "Spinning at times" none presently. Also reports chest tightness and "My heart just doesn't feel right." Pt could not verbalize sensation; denies palpitations, "Just not right."  Also states SOB. Denies headache. Pt directed to ED. States will follow disposition, husband will drive.  Reason for Disposition . [1] Weakness (i.e., paralysis, loss of muscle strength) of the face, arm / hand, or leg / foot on one side of the body AND [2] sudden onset AND [3] brief (now gone)  Answer Assessment - Initial Assessment Questions 1. SYMPTOM: "What is the main symptom you are concerned about?" (e.g., weakness, numbness)     Kept falling, leaning to the left last night 2. ONSET: "When did this start?" (minutes, hours, days; while sleeping)     2100 last night 3. LAST NORMAL: "When was the last time you were normal (no symptoms)?"    2100 last night 4. PATTERN "Does this come and go, or has it been constant since it started?"  "Is it present now?"     Intermittent, chest tightness presently 5. CARDIAC SYMPTOMS: "Have you had any of the following symptoms: chest pain, difficulty breathing, palpitations?"    Heart just doesn't feel right 6. NEUROLOGIC SYMPTOMS: "Have you had any of the following symptoms: headache, dizziness, vision loss, double vision, changes in speech, unsteady on your feet?"     Unsteady, leans to left 7. OTHER SYMPTOMS: "Do you have any other symptoms?"     dizziness  Protocols used: NEUROLOGIC DEFICIT-A-AH

## 2018-10-30 NOTE — Telephone Encounter (Signed)
Will send to Dr. Fry as FYI 

## 2018-10-30 NOTE — H&P (Signed)
History and Physical  COILA WARDELL IRC:[id number] DOB: 1945-09-24 DOA: 10/30/2018  Referring physician: Transferred from [name]: [place] [facility] PCP: Laurey Morale, MD  Outpatient Specialists:    Patient coming from: [facility] ER.  Chief Complaint: Chest pain and leaning towards the left side.  HPI: Patient is a 73 year old Caucasian female with past medical history significant for hypertension, previously documented as obese but now overweight, CVA, B12 deficiency and intermittent hypotension.  Patient was transferred from [facility] ER due to concerns for chest pain and dizziness/vertigo.  According to the patient, her problem started around 9 PM last night when she felt she was leaning towards the left side.  She also felt that she was swaying side-to-side, and found it hard to sit up.  The problem eventually resolved.  Patient is not particularly good historian.  Subsequently, patient developed chest pressure/pain, which patient now intends to minimize.  Troponins have been negative on 2 occasions.  EKG has not shown any acute changes.  CT scan of the brain has not shown any acute infarct.  No headache, no neck pain, no fever or chills, no URI symptoms, no shortness of breath, no GI symptoms and no urinary symptoms.  ED Course: Patient was assessed at [facility] ER, had negative troponins and no new EKG changes.  CT scan of the head is as documented above.  Patient was transferred to [facility] for further assessment and management.    Pertinent labs: Revealed sodium of 137, potassium of 3.4, chloride 103, CO2 24, BUN of 15 and creatinine of 0.69 with blood sugar of 95.  CBC reveals WBC of 7.3, hemoglobin of 14, hematocrit of 43.3, MCV of 88.8 with platelet count of 231.  UA reveals a specific gravity of 1.025.  Chest x-ray is negative for any active cardiopulmonary disease.  CT head without contrast revealed mild patchy periventricular small vessel disease.   No acute infarct, mass or hemorrhage was reported.  Foci of arterial vascular calcification was reported, as well as mucosal thickening in several ethmoid air cells.  EKG: Independently reviewed.  No new changes.  Imaging: independently reviewed.   Review of Systems:  Negative for fever, visual changes, sore throat, rash, new muscle aches, dysuria, bleeding, n/v/abdominal pain.  Past Medical History:  Diagnosis Date   Backache, unspecified    Hemiplegia, unspecified, affecting unspecified side    Lacunar infarction ([employer])    Other and unspecified hyperlipidemia    Retinal defect, unspecified    Rotator cuff injury    right shoulder   Unspecified essential hypertension    Venous tributary (branch) occlusion of retina     Past Surgical History:  Procedure Laterality Date   ABDOMINAL HYSTERECTOMY     CHOLECYSTECTOMY     COLONOSCOPY  01-11-10   per Dr. Laurence Spates, internal hemorrhoids only, repeat in 10 yrs    SPHINCTEROTOMY       reports that she has never smoked. She has never used smokeless tobacco. She reports that she does not drink alcohol or use drugs.  Allergies  Allergen Reactions   Amoxicillin Nausea Only   Meloxicam Other (See Comments)    Muscle weakness     Family History  Problem Relation Age of Onset   Heart disease Mother        died at age 3   Hyperlipidemia Brother    Hypertension Brother    Diabetes Brother    Heart disease Brother  CAD/MI/CABG   Peripheral vascular disease Brother      Prior to Admission medications   Medication Sig Start Date End Date Taking? Authorizing Provider  enalapril (VASOTEC) 20 MG tablet Take 1 tablet (20 mg total) by mouth daily. 02/27/18   Laurey Morale, MD  vitamin B-12 (CYANOCOBALAMIN) 1000 MCG tablet Take 1 tablet (1,000 mcg total) by mouth daily. 10/06/18   Laurey Morale, MD    Physical Exam: Vitals:   10/30/18 1600 10/30/18 1720 10/30/18 1813 10/30/18 1829  BP: (!) 160/74 (!)  145/80 (!) 162/89   Pulse: 71 68  95  Resp: 12 16 18    Temp:   97.6 F (36.4 C)   TempSrc:   Oral   SpO2: 100% 96%  96%  Weight:   72.6 kg   Height:   5\' 2"  (1.575 m)     Constitutional:   Appears calm and comfortable Eyes:   No pallor. No jaundice.  ENMT:   external ears, nose appear normal.  Dry buccal mucosa Neck:   Neck is supple. No JVD Respiratory:   CTA bilaterally, no w/r/r.   Respiratory effort normal. No retractions or accessory muscle use Cardiovascular:   S1S2  No LE extremity edema   Abdomen:   Abdomen is soft and non tender. Organs are difficult to assess. Neurologic:   Awake and alert.  Moves all limbs.  Wt Readings from Last 3 Encounters:  10/30/18 72.6 kg  08/11/18 74.8 kg  05/02/18 75.5 kg    I have personally reviewed following labs and imaging studies  Labs on Admission:  CBC: Recent Labs  Lab 10/30/18 1249  WBC 7.3  NEUTROABS 4.3  HGB 14.0  HCT 43.3  MCV 88.2  PLT 347   Basic Metabolic Panel: Recent Labs  Lab 10/30/18 1249  NA 137  K 3.4*  CL 103  CO2 24  GLUCOSE 95  BUN 15  CREATININE 0.69  CALCIUM 8.9   Liver Function Tests: Recent Labs  Lab 10/30/18 1249  AST 20  ALT 8  ALKPHOS 60  BILITOT 0.9  PROT 7.0  ALBUMIN 3.5   No results for input(s): LIPASE, AMYLASE in the last 168 hours. No results for input(s): AMMONIA in the last 168 hours. Coagulation Profile: No results for input(s): INR, PROTIME in the last 168 hours. Cardiac Enzymes: Recent Labs  Lab 10/30/18 1249 10/30/18 1549  TROPONINI <0.03 <0.03   BNP (last 3 results) No results for input(s): PROBNP in the last 8760 hours. HbA1C: No results for input(s): HGBA1C in the last 72 hours. CBG: No results for input(s): GLUCAP in the last 168 hours. Lipid Profile: No results for input(s): CHOL, HDL, LDLCALC, TRIG, CHOLHDL, LDLDIRECT in the last 72 hours. Thyroid Function Tests: No results for input(s): TSH, T4TOTAL, FREET4, T3FREE, THYROIDAB  in the last 72 hours. Anemia Panel: Recent Labs    10/28/18 1356  VITAMINB12 679   Urine analysis:    Component Value Date/Time   COLORURINE YELLOW 10/30/2018 1249   APPEARANCEUR CLEAR 10/30/2018 1249   LABSPEC 1.025 10/30/2018 1249   PHURINE 5.5 10/30/2018 1249   GLUCOSEU NEGATIVE 10/30/2018 1249   GLUCOSEU NEGATIVE 03/20/2011 1535   HGBUR NEGATIVE 10/30/2018 1249   BILIRUBINUR NEGATIVE 10/30/2018 1249   BILIRUBINUR n 12/27/2015 [place] 10/30/2018 1249   PROTEINUR NEGATIVE 10/30/2018 1249   UROBILINOGEN 0.2 12/27/2015 1203   UROBILINOGEN 0.2 09/07/2013 1501   NITRITE NEGATIVE 10/30/2018 1249   LEUKOCYTESUR SMALL (A) 10/30/2018 1249  Sepsis Labs: @LABRCNTIP (procalcitonin:4,lacticidven:4) )No results found for this or any previous visit (from the past 240 hour(s)).    Radiological Exams on Admission: Dg Chest 2 View  Result Date: 10/30/2018 CLINICAL DATA:  Chest tightness and weakness. EXAM: CHEST - 2 VIEW COMPARISON:  Chest x-ray dated February 11, 2017. FINDINGS: The heart size and mediastinal contours are within normal limits. Atherosclerotic calcification of the aortic arch. Normal pulmonary vascularity. No focal consolidation, pleural effusion, or pneumothorax. No acute osseous abnormality. IMPRESSION: No active cardiopulmonary disease. Electronically Signed   By: Titus Dubin M.D.   On: 10/30/2018 13:17   Ct Head Wo Contrast  Result Date: 10/30/2018 CLINICAL DATA:  Ataxia EXAM: CT HEAD WITHOUT CONTRAST TECHNIQUE: Contiguous axial images were obtained from the base of the skull through the vertex without intravenous contrast. COMPARISON:  February 08, 2008 FINDINGS: Brain: There is age related volume loss. There is no intracranial mass, hemorrhage, extra-axial fluid collection, or midline shift. There is mild patchy small vessel disease in the centra semiovale bilaterally. Elsewhere brain parenchyma appears unremarkable. There is no demonstrable acute infarct.  Vascular: There is no appreciable hyperdense vessel. There is calcification in each carotid siphon region. Skull: The bony calvarium appears intact. Sinuses/Orbits: There is mucosal thickening in several ethmoid air cells. Frontal sinuses are hypoplastic. Visualized orbits appear symmetric bilaterally. Other: Mastoid air cells are clear. IMPRESSION: Mild patchy periventricular small vessel disease. No acute infarct. No mass or hemorrhage. There are foci of arterial vascular calcification. There is mucosal thickening in several ethmoid air cells. Electronically Signed   By: Lowella Grip III M.D.   On: 10/30/2018 13:08    EKG: Independently reviewed.   Active Problems:   Chest pain   Assessment/Plan Chest pain: Chest pain has resolved. Troponin has been negative on 2 occasions. Repeat troponin in the morning EKG has not shown any acute changes Patient will follow with cardiology on discharge.   Vertigo: Patient is a poor historian. CT head is nonrevealing. We will proceed with MRI brain. Continue aspirin Meclizine as needed Consult physical therapy  Hypokalemia: Monitor and replete.  Volume depletion: Mild. IV fluids.  Hypertension: Continue to monitor closely Check orthostatic blood pressure  Further management depend on hospital course.  DVT prophylaxis: Subcu Lovenox Code Status: Full Family Communication:  Disposition Plan: Home in a.m. if work-up is nonrevealing Consults called: None Admission status: Observation  Time spent: 65 minutes.  Dana Allan, MD  [facility] Pager #: [phone] 7PM-7AM contact night coverage as above  10/30/2018, 7:28 PM

## 2018-10-30 NOTE — ED Provider Notes (Signed)
Emergency Department Provider Note   I have reviewed the triage vital signs and the nursing notes.   HISTORY  Chief Complaint Dizziness   HPI TYMIKA GRILLI is a 73 y.o. female with PMH of CVA, HTN, and elevated BMI presents to the emergency department for evaluation of feeling off balance along with chest heaviness this morning.  Patient states that she woke up at 9 PM yesterday and sat on the edge of the bed.  She reports a sensation of movement and leaning to the left.  She states this lasted for several seconds to a minute and improved with her lying flat.  She was able to get up and walk to the bathroom although reports some hesitation with walking.  This morning, the patient awoke with no vertigo or off-balance symptoms but felt a heaviness/tightness in her chest along with mild dyspnea.  She feels generalized weakness.  She notes issues with low B12 in the recent past but had repeat testing after treatment this past week and noted that her levels have improved.  She also notes history of intermittent hypotension but has made changes to her blood pressure medication along with her PCP and those symptoms have resolved.  She denies any unilateral weakness/numbness.  No vision changes.  She does have active chest heaviness at this time but states it is very mild. Denies history of CAD but does have a family history.    Past Medical History:  Diagnosis Date  . Backache, unspecified   . Hemiplegia, unspecified, affecting unspecified side   . Lacunar infarction ([place])   . Other and unspecified hyperlipidemia   . Retinal defect, unspecified   . Rotator cuff injury    right shoulder  . Unspecified essential hypertension   . Venous tributary (branch) occlusion of retina     Patient Active Problem List   Diagnosis Date Noted  . Carpal tunnel syndrome, bilateral 08/11/2018  . OTHER AND UNSPECIFIED HYPERLIPIDEMIA 11/22/2009  . LACUNAR INFARCTION 11/22/2009  . BACK PAIN, CHRONIC  11/22/2009  . UNSPECIFIED RETINAL DEFECT 11/08/2009  . ROTATOR CUFF INJURY, RIGHT SHOULDER 11/08/2009  . [place] OCCLUSION 08/12/2007  . Essential hypertension 08/12/2007    Past Surgical History:  Procedure Laterality Date  . ABDOMINAL HYSTERECTOMY    . CHOLECYSTECTOMY    . COLONOSCOPY  01-11-10   per Dr. Laurence Spates, internal hemorrhoids only, repeat in 10 yrs   . SPHINCTEROTOMY      Allergies Amoxicillin and Meloxicam  Family History  Problem Relation Age of Onset  . Heart disease Mother        died at age 47  . Hyperlipidemia Brother   . Hypertension Brother   . Diabetes Brother   . Heart disease Brother        CAD/MI/CABG  . Peripheral vascular disease Brother     Social History Social History   Tobacco Use  . Smoking status: Never Smoker  . Smokeless tobacco: Never Used  Substance Use Topics  . Alcohol use: No    Alcohol/week: 0.0 standard drinks  . Drug use: No    Review of Systems  Constitutional: No fever/chills. Positive generalized weakness.  Eyes: No visual changes. ENT: No sore throat. Vertigo last night (resolved)  Cardiovascular: Positive chest heaviness.  Respiratory: Mild shortness of breath. Gastrointestinal: No abdominal pain.  No nausea, no vomiting.  No diarrhea.  No constipation. Genitourinary: Negative for dysuria. Musculoskeletal: Negative for back pain. Skin: Negative for rash. Neurological: Negative for headaches, focal  weakness or numbness.  10-point ROS otherwise negative.  ____________________________________________   PHYSICAL EXAM:  VITAL SIGNS: ED Triage Vitals  Enc Vitals Group     BP 10/30/18 1221 (!) 184/81     Pulse Rate 10/30/18 1221 90     Resp 10/30/18 1221 20     Temp 10/30/18 1221 (!) 97.4 F (36.3 C)     Temp Source 10/30/18 1221 Oral     SpO2 10/30/18 1221 100 %     Weight 10/30/18 1220 160 lb (72.6 kg)     Height 10/30/18 1220 5\' 2"  (1.575 m)     Pain Score 10/30/18 1220 0    Constitutional: Alert and oriented. Well appearing and in no acute distress. Eyes: Conjunctivae are normal. PERRL. EOMI. Head: Atraumatic. Nose: No congestion/rhinnorhea. Mouth/Throat: Mucous membranes are moist.   Neck: No stridor.   Cardiovascular: Normal rate, regular rhythm. Good peripheral circulation. Grossly normal heart sounds.   Respiratory: Normal respiratory effort.  No retractions. Lungs CTAB. Gastrointestinal: Soft and nontender. No distention.  Musculoskeletal: No lower extremity tenderness nor edema. No gross deformities of extremities. Neurologic:  Normal speech and language. No gross focal neurologic deficits are appreciated. Normal CN exam 2-12. Normal finger-to-nose testing. No pronator drift.  Skin:  Skin is warm, dry and intact. No rash noted.   ____________________________________________   LABS (all labs ordered are listed, but only abnormal results are displayed)  Labs Reviewed  COMPREHENSIVE METABOLIC PANEL - Abnormal; Notable for the following components:      Result Value   Potassium 3.4 (*)    All other components within normal limits  URINALYSIS, ROUTINE W REFLEX MICROSCOPIC - Abnormal; Notable for the following components:   Leukocytes,Ua SMALL (*)    All other components within normal limits  URINALYSIS, MICROSCOPIC (REFLEX) - Abnormal; Notable for the following components:   Bacteria, UA FEW (*)    All other components within normal limits  BRAIN NATRIURETIC PEPTIDE  TROPONIN I  CBC WITH DIFFERENTIAL/PLATELET   ____________________________________________  EKG   EKG Interpretation  Date/Time:  Thursday October 30 2018 12:23:25 EDT Ventricular Rate:  82 PR Interval:    QRS Duration: 94 QT Interval:  363 QTC Calculation: 424 R Axis:   52 Text Interpretation:  Sinus rhythm Nonspecific T abnormalities, lateral leads Baseline wander in lead(s) I III aVR aVL V2 No STEMI.  Confirmed by Nanda Quinton [phone]) on 10/30/2018 12:26:15 PM        ____________________________________________  RADIOLOGY  Dg Chest 2 View  Result Date: 10/30/2018 CLINICAL DATA:  Chest tightness and weakness. EXAM: CHEST - 2 VIEW COMPARISON:  Chest x-ray dated February 11, 2017. FINDINGS: The heart size and mediastinal contours are within normal limits. Atherosclerotic calcification of the aortic arch. Normal pulmonary vascularity. No focal consolidation, pleural effusion, or pneumothorax. No acute osseous abnormality. IMPRESSION: No active cardiopulmonary disease. Electronically Signed   By: Titus Dubin M.D.   On: 10/30/2018 13:17   Ct Head Wo Contrast  Result Date: 10/30/2018 CLINICAL DATA:  Ataxia EXAM: CT HEAD WITHOUT CONTRAST TECHNIQUE: Contiguous axial images were obtained from the base of the skull through the vertex without intravenous contrast. COMPARISON:  February 08, 2008 FINDINGS: Brain: There is age related volume loss. There is no intracranial mass, hemorrhage, extra-axial fluid collection, or midline shift. There is mild patchy small vessel disease in the centra semiovale bilaterally. Elsewhere brain parenchyma appears unremarkable. There is no demonstrable acute infarct. Vascular: There is no appreciable hyperdense vessel. There is calcification in each  carotid siphon region. Skull: The bony calvarium appears intact. Sinuses/Orbits: There is mucosal thickening in several ethmoid air cells. Frontal sinuses are hypoplastic. Visualized orbits appear symmetric bilaterally. Other: Mastoid air cells are clear. IMPRESSION: Mild patchy periventricular small vessel disease. No acute infarct. No mass or hemorrhage. There are foci of arterial vascular calcification. There is mucosal thickening in several ethmoid air cells. Electronically Signed   By: Lowella Grip III M.D.   On: 10/30/2018 13:08    ____________________________________________   PROCEDURES  Procedure(s) performed:   Procedures  None  ____________________________________________    INITIAL IMPRESSION / ASSESSMENT AND PLAN / ED COURSE  Pertinent labs & imaging results that were available during my care of the patient were reviewed by me and considered in my medical decision making (see chart for details).   Patient presents to the emergency department for evaluation of vertigo type symptoms last night which have resolved.  Patient developed some chest heaviness this morning which has persisted through the early part of the day today.  No modifying factors.  EKG shows no acute ischemic changes.  Lower suspicion for central vertigo cause given the very brief nature of this.  The symptom has not been recurrent or persistent.  She has an intact neurologic exam.  Patient does have hypertension here.  Plan for CT imaging of the head along with chest x-ray and labs including troponin for ACS w/u. HEART score calculated as a 5.   02:55 PM  Patient's lab work reviewed with mild hypokalemia.  Normal troponin.  Chest x-ray and CT imaging reviewed with no acute findings.  Patient does have white matter disease and remote lacunar infarct.  No active chest pain/pressure. I had a [place] discussion with the patient regarding her risks for ACS.  Plan for observation admission for chest pain evaluation.   Discussed patient's case with Hospitlaist, Dr. Erlinda Hong to request admission. Patient and family (if present) updated with plan. Care transferred to Hospitalist service.  I reviewed all nursing notes, vitals, pertinent old records, EKGs, labs, imaging (as available).  ____________________________________________  FINAL CLINICAL IMPRESSION(S) / ED DIAGNOSES  Final diagnoses:  Precordial chest pain     MEDICATIONS GIVEN DURING THIS VISIT:  Medications  aspirin chewable tablet 324 mg (324 mg Oral Given 10/30/18 1327)     Note:  This document was prepared using Dragon voice recognition software and may include unintentional dictation errors.  Nanda Quinton, MD Emergency Medicine    Emmitte Surgeon,  [facility], MD 10/30/18 [phone]

## 2018-10-30 NOTE — ED Notes (Signed)
Pt stated she had some mild chest pressure this AM, but denies any at this time.

## 2018-10-30 NOTE — ED Notes (Signed)
Patient transported to CT 

## 2018-10-30 NOTE — ED Triage Notes (Signed)
Reports went to bed normal and woke up around 9pm last night.  States she sat on the edge of bed and states the room felt like it was moving.  Reports "body was drifting left".  Denies fall.  Reports body felt heavy against gravity "like something was holding me down".  States this morning she endorses shortness of breath and has "heart strain".  Denies nausea, vomiting.  Reports chest pain.

## 2018-10-31 DIAGNOSIS — I10 Essential (primary) hypertension: Secondary | ICD-10-CM

## 2018-10-31 DIAGNOSIS — E876 Hypokalemia: Secondary | ICD-10-CM | POA: Diagnosis present

## 2018-10-31 DIAGNOSIS — R42 Dizziness and giddiness: Secondary | ICD-10-CM | POA: Diagnosis not present

## 2018-10-31 DIAGNOSIS — R079 Chest pain, unspecified: Secondary | ICD-10-CM | POA: Diagnosis not present

## 2018-10-31 LAB — CBC
HCT: 40.1 % (ref 36.0–46.0)
Hemoglobin: 13.2 g/dL (ref 12.0–15.0)
MCH: 28.6 pg (ref 26.0–34.0)
MCHC: 32.9 g/dL (ref 30.0–36.0)
MCV: 87 fL (ref 80.0–100.0)
Platelets: 230 10*3/uL (ref 150–400)
RBC: 4.61 MIL/uL (ref 3.87–5.11)
RDW: 14.6 % (ref 11.5–15.5)
WBC: 7 10*3/uL (ref 4.0–10.5)
nRBC: 0 % (ref 0.0–0.2)

## 2018-10-31 LAB — BASIC METABOLIC PANEL
Anion gap: 9 (ref 5–15)
BUN: 10 mg/dL (ref 8–23)
CO2: 22 mmol/L (ref 22–32)
Calcium: 8.6 mg/dL — ABNORMAL LOW (ref 8.9–10.3)
Chloride: 107 mmol/L (ref 98–111)
Creatinine, Ser: 0.69 mg/dL (ref 0.44–1.00)
GFR calc Af Amer: >60 mL/min (ref >60)
GFR calc non Af Amer: >60 mL/min (ref >60)
Glucose, Bld: 92 mg/dL (ref 70–99)
Potassium: 4.1 mmol/L (ref 3.5–5.1)
Sodium: 138 mmol/L (ref 135–145)

## 2018-10-31 LAB — TROPONIN I: Troponin I: <0.03 ng/mL (ref <0.03)

## 2018-10-31 MED ORDER — ENALAPRIL MALEATE 10 MG PO TABS: 10.0000 mg | ORAL_TABLET | Freq: Two times a day (BID) | ORAL | 0 refills | Status: DC

## 2018-10-31 MED ORDER — ASPIRIN EC 81 MG PO TBEC: 81.0000 mg | DELAYED_RELEASE_TABLET | Freq: Every day | ORAL | 0 refills | Status: DC

## 2018-10-31 MED ORDER — MECLIZINE HCL 12.5 MG PO TABS: 12.5000 mg | ORAL_TABLET | Freq: Three times a day (TID) | ORAL | 0 refills | Status: DC | PRN

## 2018-10-31 NOTE — Discharge Instructions (Signed)
Acute Pain, Adult Acute pain is a type of pain that may last for just a few days or as long as six months. It is often related to an illness, injury, or medical procedure. Acute pain may be mild, moderate, or severe. It usually goes away once your injury has healed or you are no longer ill. Pain can make it hard for you to do daily activities. It can cause anxiety and lead to other problems if left untreated. Treatment depends on the cause and severity of your acute pain. Follow these instructions at home:  Check your pain level as told by your health care provider.  Take over-the-counter and prescription medicines only as told by your health care provider.  If you are taking prescription pain medicine: ? Ask your health care provider about taking a stool softener or laxative to prevent constipation. ? Do not stop taking the medicine suddenly. Talk to your health care provider about how and when to discontinue prescription pain medicine. ? If your pain is severe, do not take more pills than instructed by your health care provider. ? Do not take other over-the-counter pain medicines in addition to this medicine unless told by your health care provider. ? Do not drive or operate heavy machinery while taking prescription pain medicine.  Apply ice or heat as told by your health care provider. These may reduce swelling and pain.  Ask your health care provider if other strategies such as distraction, relaxation, or physical therapies can help your pain.  Keep all follow-up visits as told by your health care provider. This is important. Contact a health care provider if:  You have pain that is not controlled by medicine.  Your pain does not improve or gets worse.  You have side effects from pain medicines, such as vomitingor confusion. Get help right away if:  You have severe pain.  You have trouble breathing.  You lose consciousness.  You have chest pain or pressure that lasts for more  than a few minutes. Along with the chest pain you may: ? Have pain or discomfort in one or both arms, your back, neck, jaw, or stomach. ? Have shortness of breath. ? Break out in a cold sweat. ? Feel nauseous. ? Become light-headed. These symptoms may represent a serious problem that is an emergency. Do not wait to see if the symptoms will go away. Get medical help right away. Call your local emergency services (911 in the [place] Do not drive yourself to the hospital. This information is not intended to replace advice given to you by your health care provider. Make sure you discuss any questions you have with your health care provider. Document Released: 07/17/2015 Document Revised: 12/09/2015 Document Reviewed: 07/17/2015 Elsevier Interactive Patient Education  2019 [employer]   Chest Wall Pain Chest wall pain is pain in or around the bones and muscles of your chest. Chest wall pain may be caused by:  An injury.  Coughing a lot.  Using your chest and arm muscles too much. Sometimes, the cause may not be known. This pain may take a few weeks or longer to get better. Follow these instructions at home: Managing pain, stiffness, and swelling If told, put ice on the painful area:  Put ice in a plastic bag.  Place a towel between your skin and the bag.  Leave the ice on for 20 minutes, 2-3 times a day.  Activity  Rest as told by your doctor.  Avoid doing things that cause  pain. This includes lifting heavy items.  Ask your doctor what activities are safe for you. General instructions   Take over-the-counter and prescription medicines only as told by your doctor.  Do not use any products that contain nicotine or tobacco, such as cigarettes, e-cigarettes, and chewing tobacco. If you need help quitting, ask your doctor.  Keep all follow-up visits as told by your doctor. This is important. Contact a doctor if:  You have a fever.  Your chest pain gets worse.  You have new  symptoms. Get help right away if:  You feel sick to your stomach (nauseous) or you throw up (vomit).  You feel sweaty or light-headed.  You have a cough with mucus from your lungs (sputum) or you cough up blood.  You are short of breath. These symptoms may be an emergency. Do not wait to see if the symptoms will go away. Get medical help right away. Call your local emergency services (911 in the [place] Do not drive yourself to the hospital. Summary  Chest wall pain is pain in or around the bones and muscles of your chest.  It may be treated with ice, rest, and medicines. Your condition may also get better if you avoid doing things that cause pain.  Contact a doctor if you have a fever, chest pain that gets worse, or new symptoms.  Get help right away if you feel light-headed or you get short of breath. These symptoms may be an emergency. This information is not intended to replace advice given to you by your health care provider. Make sure you discuss any questions you have with your health care provider. Document Released: 12/19/2007 Document Revised: 01/02/2018 Document Reviewed: 01/02/2018 Elsevier Interactive Patient Education  2019 [employer].

## 2018-10-31 NOTE — TOC Transition Note (Signed)
Transition of Care [facility]) - CM/SW Discharge Note   Patient Details  Name: MONIKE BRAGDON MRN: [id number] Date of Birth: 02-24-1946  [facility] CM/SW Contact:  Bethena Roys, RN Phone Number: 10/31/2018, 2:03 PM   Clinical Narrative:   Pt presented for Chest Pain- PTA from home with support of spouse. Plan to return home with [facility] PT for vestibular therapy. Patient is agreeable to services via [place] for PT vestibular. Referral called to [facility] with [facility] and SOC to begin within 24-48 hours post transition home.  Husband to provide transportation home. No further needs from CM at this time.     Final next level of care: [place] Barriers to Discharge: No Barriers Identified   Patient Goals and CMS Choice Patient states their goals for this hospitalization and ongoing recovery are:: ("wants to feel better") CMS Medicare.gov Compare Post Acute Care list provided to:: Patient Choice offered to / list presented to : Patient   Discharge Plan and Services In-house Referral: NA Discharge Planning Services: CM Consult Post Acute Care Choice: Home Health          DME Arranged: N/A DME Agency: NA HH Arranged: PT(Vestibular) [place] Agency: [facility] [facility] [employer]   Social Determinants of Health (SDOH) Interventions     Readmission Risk Interventions No flowsheet data found.

## 2018-10-31 NOTE — Discharge Summary (Signed)
[name], is a 73 y.o. female  DOB 29-Jan-1946  MRN [id number].  Admission date:  10/30/2018  Admitting Physician  Lenna Sciara, MD  Discharge Date:  10/31/2018   Primary MD  Laurey Morale, MD  Recommendations for primary care physician for things to follow:   Follow-up blood pressure  Discharge Diagnosis     Principal Problem:   Chest pain Active Problems:   Essential hypertension   Vertigo   Hypokalemia        Past Medical History:  Diagnosis Date   Backache, unspecified    Hemiplegia, unspecified, affecting unspecified side    Lacunar infarction ([place])    Other and unspecified hyperlipidemia    Retinal defect, unspecified    Rotator cuff injury    right shoulder   Unspecified essential hypertension    Venous tributary (branch) occlusion of retina     Past Surgical History:  Procedure Laterality Date   ABDOMINAL HYSTERECTOMY     CHOLECYSTECTOMY     COLONOSCOPY  01-11-10   per Dr. Laurence Spates, internal hemorrhoids only, repeat in 10 yrs    SPHINCTEROTOMY         HPI  from the history and physical done on the day of admission:    Patient is a 73 year old Caucasian female with past medical history significant for hypertension, previously documented as obese but now overweight, CVA, B12 deficiency and intermittent hypotension.  Patient was transferred from [facility] ER due to concerns for chest pain and dizziness/vertigo.  According to the patient, her problem started around 9 PM last night when she felt she was leaning towards the left side.  She also felt that she was swaying side-to-side, and found it hard to sit up.  The problem eventually resolved.  Patient is not particularly good historian.  Subsequently, patient developed chest pressure/pain, which patient now intends to  minimize.  Troponins have been negative on 2 occasions.  EKG has not shown any acute changes.  CT scan of the brain has not shown any acute infarct.  No headache, no neck pain, no fever or chills, no URI symptoms, no shortness of breath, no GI symptoms and no urinary symptoms.  ED Course: Patient was assessed at [facility] ER, had negative troponins and no new EKG changes.  CT scan of the head is as documented above.  Patient was transferred to [facility] for further assessment and management.     Hospital Course:     1.  Chest pain: Chest pain symptoms resolved prior to arrival to the hospital.  Troponins were been negative x3, and EKG showed no significant ischemic changes.  Patient with previous nuclear stress test on 02/18/2008 was noted to be normal.  Ambulatory referral placed for cardiology in regards to chest pain.  2.  Vertigo: Acute.  Patient reported feeling as though the room were moving causing her to lean  towards the left.  MRI of the brain showed no acute abnormalities.  Physical therapy evaluated the  patient and recommended home physical therapy with vestibular rehabilitation.  3.  Hypokalemia: Patient had a mildly low potassium of 3.4 on admission.  Patient was given IV fluids with 20 mEq of potassium.  Repeat potassium 4.1 prior to discharge.  Patient reported taking hydrochlorothiazide as likely cause.  However, was drinking a lot of green teas which can also have a diuretic effect.  Advised patient to limit green tea use.  4.  Essential hypertension: Blood pressures at the upper limit of normal.  At home patient had been Vistaril 20 mg daily and hydrochlorothiazide 25 mg daily.  Vistaril was changed to 10 mg twice daily to see if it helped decrease issues with blood pressure.  Patient was advised to keep yourself adequately hydrated and although hydrochlorothiazide was not initially continued on admission it was recommended for her to continue this at discharge.   Follow-up recommended with her primary care provider.   Follow UP  Follow-up Information    Laurey Morale, MD. Schedule an appointment as soon as possible for a visit.   Specialty:  Family Medicine Contact information: [place] [address] [phone]        [facility] Follow up.   Specialty:  Home Health Services Why:  Vestibular Physical Therapy           Consults obtained: Physical therapy  Discharge Condition: Stable  Diet and Activity recommendation: See Discharge Instructions below   Discharge Instructions    Ambulatory referral to Cardiology   Complete by:  As directed    Chest pain.  Troponins were negative x3 with no significant ischemic changes noted on EKG.  Previous history of negative stress test in 2009.   Discharge instructions   Complete by:  As directed    Follow with Primary Laurey Morale, MD within 7 to 10 days from hospitalization.  There were no clear signs of heart damage noted during your hospital stay.  Recommended that you take a daily aspirin, and a referral has been placed for you to be seen by cardiology for further work-up of your chest pain.  A referral for home physical therapy has also been placed for further treatment of vertigo symptoms.  For the issues with low blood pressure it was recommended that you take enalapril 10 mg twice daily instead of 20 mg once daily.  Lastly, be advised that drinking a lot of green tea can cause you to use the restroom a lot and can lead to you becoming dehydrated.  Drink more water and limit green tea use.  Get BMP-  checked  by Primary MD or SNF MD in 5-7 days ( we routinely change or add medications that can affect your baseline labs and fluid status, therefore we recommend that you get the mentioned basic workup next visit with your PCP, your PCP may decide not to get them or add new tests based on their clinical decision)  Activity: As tolerated with fall  precautions  Referrals: Home health with physical therapy  Disposition: Home  Diet: Heart Healthy  Special Instructions: If you have smoked or chewed Tobacco  in the last 2 yrs please stop smoking, stop any regular Alcohol  and or any Recreational drug use.  On your next visit with your primary care physician please Get Medicines reviewed and adjusted.  Please request your Laurey Morale, MD to go over all Hospital Tests and Procedure/Radiological results at the follow up, please get all Hospital records sent  to your Prim MD by signing hospital release before you go home.  If you experience worsening of your admission symptoms, develop shortness of breath, life threatening emergency, suicidal or homicidal thoughts you must seek medical attention immediately by calling 911 or calling your MD immediately  if symptoms less severe.  You Must read complete instructions/literature along with all the possible adverse reactions/side effects for all the Medicines you take and that have been prescribed to you. Take any new Medicines after you have completely understood and accpet all the possible adverse reactions/side effects.   Do not drive, operate heavy machinery, perform activities at heights, swimming or participation in water activities or provide baby sitting services if your were admitted for syncope or siezures until you have seen by Primary MD or a Neurologist and advised to do so again.  Do not drive when taking Pain medications.  Do not take more than prescribed Pain, Sleep and Anxiety Medications  Wear Seat belts while driving.   Please note  You were cared for by a hospitalist during your hospital stay. If you have any questions about your discharge medications or the care you received while you were in the hospital after you are discharged, you can call the unit and asked to speak with the hospitalist on call if the hospitalist that took care of you is not available. Once you are  discharged, your primary care physician will handle any further medical issues. Please note that NO REFILLS for any discharge medications will be authorized once you are discharged, as it is imperative that you return to your primary care physician (or establish a relationship with a primary care physician if you do not have one) for your aftercare needs so that they can reassess your need for medications and monitor your lab values.   Increase activity slowly   Complete by:  As directed         Discharge Medications     Allergies as of 10/31/2018      Reactions   Amoxicillin Nausea Only   Meloxicam Other (See Comments)   Muscle weakness       Medication List    TAKE these medications   aspirin EC 81 MG tablet Take 1 tablet (81 mg total) by mouth daily.   carboxymethylcellulose 0.5 % Soln Commonly known as:  REFRESH PLUS Place 1 drop into both eyes daily as needed (dry eyes).   enalapril 10 MG tablet Commonly known as:  VASOTEC Take 1 tablet (10 mg total) by mouth 2 (two) times daily. What changed:    medication strength  how much to take  when to take this   hydrochlorothiazide 25 MG tablet Commonly known as:  HYDRODIURIL Take 25 mg by mouth daily.   meclizine 12.5 MG tablet Commonly known as:  ANTIVERT Take 1 tablet (12.5 mg total) by mouth 3 (three) times daily as needed for dizziness or nausea.   vitamin B-12 1000 MCG tablet Commonly known as:  CYANOCOBALAMIN Take 1 tablet (1,000 mcg total) by mouth daily.       Major procedures and Radiology Reports - PLEASE review detailed and final reports for all details, in brief -      Dg Chest 2 View  Result Date: 10/30/2018 CLINICAL DATA:  Chest tightness and weakness. EXAM: CHEST - 2 VIEW COMPARISON:  Chest x-ray dated February 11, 2017. FINDINGS: The heart size and mediastinal contours are within normal limits. Atherosclerotic calcification of the aortic arch. Normal pulmonary vascularity. No focal consolidation,  pleural effusion, or pneumothorax. No acute osseous abnormality. IMPRESSION: No active cardiopulmonary disease. Electronically Signed   By: Titus Dubin M.D.   On: 10/30/2018 13:17   Ct Head Wo Contrast  Result Date: 10/30/2018 CLINICAL DATA:  Ataxia EXAM: CT HEAD WITHOUT CONTRAST TECHNIQUE: Contiguous axial images were obtained from the base of the skull through the vertex without intravenous contrast. COMPARISON:  February 08, 2008 FINDINGS: Brain: There is age related volume loss. There is no intracranial mass, hemorrhage, extra-axial fluid collection, or midline shift. There is mild patchy small vessel disease in the centra semiovale bilaterally. Elsewhere brain parenchyma appears unremarkable. There is no demonstrable acute infarct. Vascular: There is no appreciable hyperdense vessel. There is calcification in each carotid siphon region. Skull: The bony calvarium appears intact. Sinuses/Orbits: There is mucosal thickening in several ethmoid air cells. Frontal sinuses are hypoplastic. Visualized orbits appear symmetric bilaterally. Other: Mastoid air cells are clear. IMPRESSION: Mild patchy periventricular small vessel disease. No acute infarct. No mass or hemorrhage. There are foci of arterial vascular calcification. There is mucosal thickening in several ethmoid air cells. Electronically Signed   By: Lowella Grip III M.D.   On: 10/30/2018 13:08   Mr Brain Wo Contrast  Result Date: 10/30/2018 CLINICAL DATA:  73 y/o  F; chest pain, dizziness, vertigo. EXAM: MRI HEAD WITHOUT CONTRAST TECHNIQUE: Multiplanar, multiecho pulse sequences of the brain and surrounding structures were obtained without intravenous contrast. COMPARISON:  12/07/2004 MRI of the head.  10/30/2018 CT of the head. FINDINGS: Brain: No acute infarction, hemorrhage, hydrocephalus, extra-axial collection or mass lesion. Stable very small chronic infarctions within the pons. Mild chronic microvascular ischemic changes and volume loss of  the brain for age with mild interval progression from 2006. Punctate focus of susceptibility hypointensity within the left cerebellar hemisphere without corresponding signal abnormality on additional sequences is compatible with hemosiderin deposition of chronic microhemorrhage. Vascular: Normal flow voids. Skull and upper cervical spine: Normal marrow signal. Sinuses/Orbits: Negative. Other: None. IMPRESSION: 1. No acute intracranial abnormality identified. 2. Mild for age chronic microvascular ischemic changes and volume loss of the brain. Mild progression from 2006. Electronically Signed   By: Kristine Garbe M.D.   On: 10/30/2018 23:26    Micro Results     No results found for this or any previous visit (from the past 240 hour(s)).     Today   Subjective    [name] today reports that she has had intermittent episodes of dizziness since being admitted.   Objective   Blood pressure (!) 147/98, pulse (!) 108, temperature 98 F (36.7 C), resp. rate 18, height 5\' 2"  (1.575 m), weight 72.7 kg, SpO2 97 %.   Intake/Output Summary (Last 24 hours) at 10/31/2018 1518 Last data filed at 10/31/2018 1400 Gross per 24 hour  Intake 827.21 ml  Output 1325 ml  Net -497.79 ml    Exam  Constitutional:elderly female in  NAD, calm, comfortable Eyes: PERRL, lids and conjunctivae normal ENMT: Mucous membranes are moist. Posterior pharynx clear of any exudate or lesions.Normal dentition.  Neck: normal, supple, no masses, no thyromegaly Respiratory: clear to auscultation bilaterally, no wheezing, no crackles. Normal respiratory effort. No accessory muscle use.  Cardiovascular: Regular rate and rhythm, no murmurs / rubs / gallops. No extremity edema. 2+ pedal pulses. No carotid bruits.  Abdomen: no tenderness, no masses palpated. No hepatosplenomegaly. Bowel sounds positive.  Musculoskeletal: no clubbing / cyanosis. No joint deformity upper and lower extremities. Good ROM, no  contractures. Normal muscle tone.  Skin: no rashes, lesions, ulcers. No induration Neurologic: CN 2-12 grossly intact. Sensation intact, DTR normal. Strength 5/5 in all 4.  Psychiatric: Normal judgment and insight. Alert and oriented x 3. Normal mood.    Data Review   CBC w Diff:  Lab Results  Component Value Date   WBC 7.0 10/31/2018   HGB 13.2 10/31/2018   HCT 40.1 10/31/2018   PLT 230 10/31/2018   LYMPHOPCT 31 10/30/2018   MONOPCT 8 10/30/2018   EOSPCT 1 10/30/2018   BASOPCT 1 10/30/2018    CMP:  Lab Results  Component Value Date   NA 138 10/31/2018   K 4.1 10/31/2018   CL 107 10/31/2018   CO2 22 10/31/2018   BUN 10 10/31/2018   CREATININE 0.69 10/31/2018   PROT 7.0 10/30/2018   ALBUMIN 3.5 10/30/2018   BILITOT 0.9 10/30/2018   ALKPHOS 60 10/30/2018   AST 20 10/30/2018   ALT 8 10/30/2018  .   Total Time in preparing paper work, data evaluation and todays exam - 35 minutes  Norval Morton M.D on 10/31/2018 at 3:18 PM  [facility]   Office  [phone]

## 2018-10-31 NOTE — Evaluation (Addendum)
Physical Therapy Evaluation Patient Details Name: [name] MRN: [id number] DOB: January 16, 1946 Today's Date: 10/31/2018   History of Present Illness  73 yo admitted with chest pain and dizziness. Head CT (-) and (-) cardiac enzymes. PMHx: HTn, CVa, B12 deficiency, hypotension  Clinical Impression  Pt pleasant sitting EOB on arrival. With transition from sitting to supine pt with noted rotational nystagmus right down beating grossly 25 sec prior to subsiding with pt noting 7/10 dizziness. Performed Dix Hallpike maneuver right and left without nystagmus or symptoms. Pt with noted balance deficits and generalized weakness who will benefit from acute therapy to further assess and treat vestibular symptoms and impairments to decrease burden of care and fall risk.   BP sitting 169/95 Standing 147/98     Follow Up Recommendations Home health PT(vestibular)    Equipment Recommendations  None recommended by PT    Recommendations for Other Services       Precautions / Restrictions Precautions Precautions: Fall      Mobility  Bed Mobility Overal bed mobility: Independent                Transfers Overall transfer level: Independent                  Ambulation/Gait Ambulation/Gait assistance: Min guard Gait Distance (Feet): 300 Feet Assistive device: None Gait Pattern/deviations: Shuffle   Gait velocity interpretation: 1.31 - 2.62 ft/sec, indicative of limited community ambulator General Gait Details: pt with slow gait with shuffling throughout with generalized unsteadiness with 2 periods of LOB with min assist to correct. Educated for need to use Rw at home for increased stability and decreased fall risk  Stairs            Wheelchair Mobility    Modified Rankin (Stroke Patients Only)       Balance Overall balance assessment: Needs assistance   Sitting balance-Leahy Scale: Good       Standing balance-Leahy Scale: Good                                Pertinent Vitals/Pain Pain Assessment: No/denies pain    Home Living Family/patient expects to be discharged to:: Private residence Living Arrangements: Spouse/significant other;Children Available Help at Discharge: Family;Available 24 hours/day Type of Home: House Home Access: Stairs to enter   [employer] of Steps: 1 Home Layout: Two level;Laundry or work area in [place]: Environmental consultant - 2 wheels      Prior Function Level of Independence: Independent         Comments: pt reports generalized fatigue     Hand Dominance        Extremity/Trunk Assessment   Upper Extremity Assessment Upper Extremity Assessment: Generalized weakness    Lower Extremity Assessment Lower Extremity Assessment: Generalized weakness    Cervical / Trunk Assessment Cervical / Trunk Assessment: Kyphotic  Communication   Communication: No difficulties  Cognition Arousal/Alertness: Awake/alert Behavior During Therapy: WFL for tasks assessed/performed Overall Cognitive Status: Impaired/Different from baseline Area of Impairment: Problem solving                             Problem Solving: Slow processing        General Comments      Exercises     Assessment/Plan    PT Assessment Patient needs continued PT services  PT Problem List Decreased strength;Decreased balance;Decreased cognition;Decreased activity  tolerance;Decreased coordination;Decreased safety awareness;Decreased mobility       PT Treatment Interventions Gait training;Therapeutic activities;Stair training;Therapeutic exercise;Neuromuscular re-education;Functional mobility training;DME instruction;Balance training;Patient/family education    PT Goals (Current goals can be found in the Care Plan section)  Acute Rehab PT Goals Patient Stated Goal: return home PT Goal Formulation: With patient Time For Goal Achievement: 11/14/18 Potential to Achieve Goals: Good     Frequency Min 3X/week   Barriers to discharge        Co-evaluation               AM-PAC PT "6 Clicks" Mobility  Outcome Measure Help needed turning from your back to your side while in a flat bed without using bedrails?: None Help needed moving from lying on your back to sitting on the side of a flat bed without using bedrails?: None Help needed moving to and from a bed to a chair (including a wheelchair)?: None Help needed standing up from a chair using your arms (e.g., wheelchair or bedside chair)?: None Help needed to walk in hospital room?: A Little Help needed climbing 3-5 steps with a railing? : A Little 6 Click Score: 22    End of Session   Activity Tolerance: Patient tolerated treatment well Patient left: in chair;with call bell/phone within reach Nurse Communication: Mobility status PT Visit Diagnosis: Other abnormalities of gait and mobility (R26.89);Muscle weakness (generalized) (M62.81);History of falling (Z91.81)    Time: 7680-8811 PT Time Calculation (min) (ACUTE ONLY): 27 min   Charges:   PT Evaluation $PT Eval Moderate Complexity: 1 Mod PT Treatments $Gait Training: 8-22 mins        Racheal Mathurin Pam Drown, PT Acute Rehabilitation Services Pager: [phone] Office: [phone]   Keisuke Hollabaugh B Marchia Diguglielmo 10/31/2018, 8:27 AM

## 2018-10-31 NOTE — Progress Notes (Signed)
Discharge instructions reviewed with pt. Pt has no questions at this time. IV d/c.

## 2018-10-31 NOTE — Progress Notes (Signed)
Physical Therapy Treatment Patient Details Name: [name] MRN: [id number] DOB: May 04, 1946 Today's Date: 10/31/2018    History of Present Illness 73 yo admitted with chest pain and dizziness. Head CT (-) and (-) cardiac enzymes. PMHx: HTn, CVa, B12 deficiency, hypotension    PT Comments    Patient seen for vestibular assessment (see balance section below). Negative central signs, possible BPPV R ear noted. Discussed role of vestibular PT and likely follow up appointments with patient. She reports no further questions. Advised for time being to have husband supervise her OOB mobility.    Follow Up Recommendations  Home health PT (Vestibular PT)     Equipment Recommendations  None recommended by PT    Recommendations for Other Services       Precautions / Restrictions Precautions Precautions: Fall    Mobility  Bed Mobility Overal bed mobility: Independent                Transfers Overall transfer level: Independent                  Ambulation/Gait Ambulation/Gait assistance: Min guard Gait Distance (Feet): 100 Feet Assistive device: None Gait Pattern/deviations: Shuffle         Stairs             Wheelchair Mobility    Modified Rankin (Stroke Patients Only)       Balance Overall balance assessment: Needs assistance   Sitting balance-Leahy Scale: Good       Standing balance-Leahy Scale: Good             10/31/18 0001  Vestibular Assessment  General Observation Pt describes 1 bout of vertigo in MRI machine and hours of imbalance following. she states tuesday AM getting out of bed she felt simliar. only two instances she could recall of dizziness or vertigo but reports decreasing balance over the last several months in general. no recent head colds, ringing in her L ear.   Symptom Behavior  Type of Dizziness  Imbalance;Vertigo  Frequency of Dizziness 1 minute of spinning, followed by hours of imbalance.   Oculomotor Exam   Oculomotor Alignment Normal  Spontaneous Absent  Gaze-induced  Absent  Head shaking Horizontal Absent  Head Shaking Vertical Absent  Smooth Pursuits Intact  Saccades Intact  Comment (-) HIT, (-) VOR cancellation, (-) test of skew   Positional Testing  Dix-Hallpike Dix-Hallpike Right;Dix-Hallpike Left  Horizontal Canal Testing Horizontal Canal Right;Horizontal Canal Left  Dix-Hallpike Right  Dix-Hallpike Right Duration 30  Dix-Hallpike Right Symptoms No nystagmus  Dix-Hallpike Left  Dix-Hallpike Left Duration 30  Dix-Hallpike Left Symptoms No nystagmus  Horizontal Canal Right  Horizontal Canal Right Duration 30  Horizontal Canal Right Symptoms  (patient had rotational R upbeating nystagmas)  Cognition  Cognition Orientation Level Appropriate for developmental age  Orthostatics  BP sitting 180/80  BP standing (after 1 minute) 180/90                       Cognition Arousal/Alertness: Awake/alert Behavior During Therapy: WFL for tasks assessed/performed Overall Cognitive Status: Impaired/Different from baseline Area of Impairment: Problem solving                             Problem Solving: Slow processing        Exercises      General Comments        Pertinent Vitals/Pain      Home  Living                      Prior Function            PT Goals (current goals can now be found in the care plan section) Acute Rehab PT Goals Patient Stated Goal: return home PT Goal Formulation: With patient Time For Goal Achievement: 11/14/18 Potential to Achieve Goals: Good Progress towards PT goals: Progressing toward goals    Frequency    Min 3X/week      PT Plan Current plan remains appropriate    Co-evaluation              AM-PAC PT "6 Clicks" Mobility   Outcome Measure  Help needed turning from your back to your side while in a flat bed without using bedrails?: None Help needed moving from lying on your back to sitting  on the side of a flat bed without using bedrails?: None Help needed moving to and from a bed to a chair (including a wheelchair)?: None Help needed standing up from a chair using your arms (e.g., wheelchair or bedside chair)?: None Help needed to walk in hospital room?: A Little Help needed climbing 3-5 steps with a railing? : A Little 6 Click Score: 22    End of Session Equipment Utilized During Treatment: Gait belt Activity Tolerance: Patient tolerated treatment well Patient left: in chair;with call bell/phone within reach Nurse Communication: Mobility status PT Visit Diagnosis: Other abnormalities of gait and mobility (R26.89);Muscle weakness (generalized) (M62.81);History of falling (Z91.81)     Time: 1105-1200 PT Time Calculation (min) (ACUTE ONLY): 55 min  Charges:  $Gait Training: 8-22 mins $Therapeutic Activity: 8-22 mins                     Reinaldo Berber, PT, DPT Acute Rehabilitation Services Pager: [phone] Office: [phone]     Reinaldo Berber 10/31/2018, 2:34 PM

## 2018-11-03 ENCOUNTER — Telehealth: Payer: Self-pay | Admitting: *Deleted

## 2018-11-03 ENCOUNTER — Telehealth: Payer: Self-pay | Admitting: Family Medicine

## 2018-11-03 ENCOUNTER — Other Ambulatory Visit: Payer: Self-pay

## 2018-11-03 ENCOUNTER — Encounter: Payer: Self-pay | Admitting: Family Medicine

## 2018-11-03 ENCOUNTER — Ambulatory Visit (INDEPENDENT_AMBULATORY_CARE_PROVIDER_SITE_OTHER): Payer: Medicare HMO | Admitting: Family Medicine

## 2018-11-03 DIAGNOSIS — Z8673 Personal history of transient ischemic attack (TIA), and cerebral infarction without residual deficits: Secondary | ICD-10-CM | POA: Diagnosis not present

## 2018-11-03 DIAGNOSIS — Z7982 Long term (current) use of aspirin: Secondary | ICD-10-CM | POA: Diagnosis not present

## 2018-11-03 DIAGNOSIS — I10 Essential (primary) hypertension: Secondary | ICD-10-CM

## 2018-11-03 DIAGNOSIS — R079 Chest pain, unspecified: Secondary | ICD-10-CM | POA: Diagnosis not present

## 2018-11-03 DIAGNOSIS — R42 Dizziness and giddiness: Secondary | ICD-10-CM | POA: Diagnosis not present

## 2018-11-03 DIAGNOSIS — E538 Deficiency of other specified B group vitamins: Secondary | ICD-10-CM | POA: Diagnosis not present

## 2018-11-03 DIAGNOSIS — E785 Hyperlipidemia, unspecified: Secondary | ICD-10-CM | POA: Diagnosis not present

## 2018-11-03 DIAGNOSIS — E669 Obesity, unspecified: Secondary | ICD-10-CM | POA: Diagnosis not present

## 2018-11-03 MED ORDER — HYDROCHLOROTHIAZIDE 25 MG PO TABS: 12.5000 mg | ORAL_TABLET | Freq: Every day | ORAL | Status: DC

## 2018-11-03 MED ORDER — ENALAPRIL MALEATE 10 MG PO TABS: 5.0000 mg | ORAL_TABLET | Freq: Two times a day (BID) | ORAL | 0 refills | Status: DC

## 2018-11-03 NOTE — Progress Notes (Addendum)
Subjective:    Patient ID: [name], female    DOB: July 14, 1946, 73 y.o.   MRN: [id number]  HPI Virtual Visit via Video Note  I connected with the patient on 11/03/18 at  2:15 PM EDT by a video enabled telemedicine application and verified that I am speaking with the correct person using two identifiers.  Location patient: home Location provider:work or home office Persons participating in the virtual visit: patient, provider  I discussed the limitations of evaluation and management by telemedicine and the availability of in person appointments. The patient expressed understanding and agreed to proceed.   HPI: Here for transitional care management after a hospital stay from 10-30-18 to 10-31-18 for chest pain, weakness, and vertigo. She went to the ER complaining of generalized weakness, of dizziness and difficulty keeping her balance, of tending to fall over to the left side, and of tightness in th chest. No cough or SOB or fever. No headache or ST. No NVD. She was found to be quite dehydrated and she received IV fluids. Her potassium on admission was low at 3.4 and this was corrected, so that this was up to 4.1 on DC. Her renal status was normal with a creatinine of 0.69. CXR  EKG were normal. Cardiac enzymes were normal.  She had both a CT and an MRI of the brain, with no acute findings. She was sent home and her BP medications were changed slightly. Since then she has been taking Enalapril 5 mg bid and HCTZ 12. 5 mg daily. Her BP and pulse readings today have been: 131/78 and 84, 111/63 and 93, and 125/69 and 102. She feels fine in general. A referral had been made for here to see Cardiology, but she has not heard back form this as yet. The vertigo has resolved.    ROS: See pertinent positives and negatives per HPI.  Past Medical History:  Diagnosis Date  . Backache, unspecified   . Hemiplegia, unspecified, affecting unspecified side   . Lacunar infarction ([place])   . Other and  unspecified hyperlipidemia   . Retinal defect, unspecified   . Rotator cuff injury    right shoulder  . Unspecified essential hypertension   . Venous tributary (branch) occlusion of retina     Past Surgical History:  Procedure Laterality Date  . ABDOMINAL HYSTERECTOMY    . CHOLECYSTECTOMY    . COLONOSCOPY  01-11-10   per Dr. Laurence Spates, internal hemorrhoids only, repeat in 10 yrs   . SPHINCTEROTOMY      Family History  Problem Relation Age of Onset  . Heart disease Mother        died at age 105  . Hyperlipidemia Brother   . Hypertension Brother   . Diabetes Brother   . Heart disease Brother        CAD/MI/CABG  . Peripheral vascular disease Brother      Current Outpatient Medications:  .  aspirin EC 81 MG tablet, Take 1 tablet (81 mg total) by mouth daily., Disp: 30 tablet, Rfl: 0 .  carboxymethylcellulose (REFRESH PLUS) 0.5 % SOLN, Place 1 drop into both eyes daily as needed (dry eyes)., Disp: , Rfl:  .  enalapril (VASOTEC) 10 MG tablet, Take 1 tablet (10 mg total) by mouth 2 (two) times daily., Disp: 60 tablet, Rfl: 0 .  hydrochlorothiazide (HYDRODIURIL) 25 MG tablet, Take 25 mg by mouth daily., Disp: , Rfl:  .  meclizine (ANTIVERT) 12.5 MG tablet, Take 1 tablet (12.5 mg total)  by mouth 3 (three) times daily as needed for dizziness or nausea., Disp: 30 tablet, Rfl: 0 .  vitamin B-12 (CYANOCOBALAMIN) 1000 MCG tablet, Take 1 tablet (1,000 mcg total) by mouth daily., Disp: 90 tablet, Rfl: 1  EXAM:  VITALS per patient if applicable:  GENERAL: alert, oriented, appears well and in no acute distress  HEENT: atraumatic, conjunttiva clear, no obvious abnormalities on inspection of external nose and ears  NECK: normal movements of the head and neck  LUNGS: on inspection no signs of respiratory distress, breathing rate appears normal, no obvious gross SOB, gasping or wheezing  CV: no obvious cyanosis  MS: moves all visible extremities without noticeable abnormality   PSYCH/NEURO: pleasant and cooperative, no obvious depression or anxiety, speech and thought processing grossly intact  ASSESSMENT AND PLAN: Transitional care management. She seems to have been dehydrated and was hypotensive at the hospital. Now that she is taking half doses of her BP medications, she is doing much better. I encouraged her to drink fluids. We will keep her meds at the present dosages, and she will follow up with Cardiology soon.  Alysia Penna, MD  Discussed the following assessment and plan:  No diagnosis found.     I discussed the assessment and treatment plan with the patient. The patient was provided an opportunity to ask questions and all were answered. The patient agreed with the plan and demonstrated an understanding of the instructions.   The patient was advised to call back or seek an in-person evaluation if the symptoms worsen or if the condition fails to improve as anticipated.     Review of Systems     Objective:   Physical Exam        Assessment & Plan:

## 2018-11-03 NOTE — Telephone Encounter (Signed)
Transition Care Management Follow-up Telephone Call   Date discharged? 10/31/2018   How have you been since you were released from the hospital? I have that vertigo still, feeling groggy, and weak    Do you understand why you were in the hospital? no   Do you understand the discharge instructions? yes   Where were you discharged to? Home   Items Reviewed:  Medications reviewed: yes  Allergies reviewed: yes  Dietary changes reviewed: N/A  Referrals reviewed: N/A    Functional Questionnaire:   Activities of Daily Living (ADLs):   She states they are independent in the following: ambulation, bathing and hygiene, feeding, continence, grooming, toileting and dressing (Patient reports is kind of difficult d/t carpel tunnel) States they require assistance with the following: N/A    Any transportation issues/concerns?: no   Any patient concerns? Yes ( dehydration, pain since taking Meloxicam, vertigo)   Confirmed importance and date/time of follow-up visits scheduled yes   Provider Appointment booked with   Confirmed with patient if condition begins to worsen call PCP or go to the ER.  Patient was given the office number and encouraged to call back with question or concerns.  : yes

## 2018-11-03 NOTE — Addendum Note (Signed)
Addended by: Alysia Penna A on: 11/03/2018 05:45 PM   Modules accepted: Level of Service

## 2018-11-03 NOTE — Telephone Encounter (Signed)
Copied from [place] [id number]. Topic: Quick Communication - Home Health Verbal Orders >> Nov 03, 2018  4:36 PM Valla Leaver wrote: Caller/Agency: Suezanne Jacquet, PT w/ [facility] Call[facility] Number: [phone] Requesting OT/PT/Skilled Nursing/Social Work/Speech Therapy:PT vestibular reab Frequency: 2x a wk for 3 wks

## 2018-11-05 NOTE — Telephone Encounter (Signed)
Called and spoke with [facility] with [facility] and he is aware of orders.  Nothing further is needed.

## 2018-11-13 DIAGNOSIS — R42 Dizziness and giddiness: Secondary | ICD-10-CM

## 2018-11-13 DIAGNOSIS — E538 Deficiency of other specified B group vitamins: Secondary | ICD-10-CM

## 2018-11-13 DIAGNOSIS — I10 Essential (primary) hypertension: Secondary | ICD-10-CM

## 2018-11-13 DIAGNOSIS — E669 Obesity, unspecified: Secondary | ICD-10-CM

## 2018-11-13 DIAGNOSIS — Z7982 Long term (current) use of aspirin: Secondary | ICD-10-CM | POA: Diagnosis not present

## 2018-11-13 DIAGNOSIS — E785 Hyperlipidemia, unspecified: Secondary | ICD-10-CM

## 2018-11-13 DIAGNOSIS — Z8673 Personal history of transient ischemic attack (TIA), and cerebral infarction without residual deficits: Secondary | ICD-10-CM

## 2018-11-17 ENCOUNTER — Telehealth: Payer: Self-pay | Admitting: Cardiology

## 2018-11-17 NOTE — Telephone Encounter (Signed)
Pt called re: someone had called her.. no note in the chart... may be re: her appt 11/21/18.Marland Kitchen she is willing to do a Video visit but needs instructions... advised her I will forward to Dr. Theodosia Blender nurse to call her back.. to be sure which program Dr. Radford Pax is using. She agreed and she she will be home after 1pm.

## 2018-11-17 NOTE — Telephone Encounter (Signed)
New Message ° ° ° °Pt is returning a call  ° ° °Please call back  °

## 2018-11-19 ENCOUNTER — Encounter: Payer: Self-pay | Admitting: Cardiology

## 2018-11-19 NOTE — Telephone Encounter (Signed)

## 2018-11-19 NOTE — Telephone Encounter (Signed)
New Message:    Patient calling stating some was going to call her concering her appt. Please call patient.

## 2018-11-19 NOTE — Telephone Encounter (Signed)
Duplicate

## 2018-11-20 NOTE — Progress Notes (Signed)
Virtual Visit via Video Note   This visit type was conducted due to national recommendations for restrictions regarding the COVID-19 Pandemic (e.g. social distancing) in an effort to limit this patient's exposure and mitigate transmission in our community.  Due to her co-morbid illnesses, this patient is at least at moderate risk for complications without adequate follow up.  This format is felt to be most appropriate for this patient at this time.  All issues noted in this document were discussed and addressed.  A limited physical exam was performed with this format.  Please refer to the patient's chart for her consent to telehealth for [facility].   Evaluation Performed: Cardiology Consult  This visit type was conducted due to national recommendations for restrictions regarding the COVID-19 Pandemic (e.g. social distancing).  This format is felt to be most appropriate for this patient at this time.  All issues noted in this document were discussed and addressed.  No physical exam was performed (except for noted visual exam findings with Video Visits).  Please refer to the patient's chart (MyChart message for video visits and phone note for telephone visits) for the patient's consent to telehealth for [facility].  Date:  11/21/2018   ID:  [name], DOB [birth year], MRN [id number]  Patient Location:  Home  Provider location:   [place]  PCP:  Laurey Morale, MD  Cardiologist: NEW Electrophysiologist:  None   Chief Complaint:  Chest pain  History of Present Illness:    [name] is a 73 y.o. female who presents via audio/video conferencing for a telehealth visit today in referral by Alysia Penna, MD for evaluation of chest pain.    This is a 73yo female with a history of HTN, hyperlipidemia, CVA and  family hx of premature CAD with her mother dying at 65 of an MI and brother with CAD/MI and CABG. she tells me that she had been taking meloxicam back in the winter and  stopped it because she had developed joint pain, constipation, swelling in her hands and feet as well as muscle weakness to the point that she would feel very weak getting up from a sitting to a standing position.  The evening prior to 10/30/2018 she awakened and sat on the edge of the bed and got very very dizzy with the room spinning.  She laid back down and sat up and then was able to get up and move around.  She went to the emergency room and was diagnosed with vertigo.  She was also having problems with low blood pressure and severe fatigue and was felt to be dehydrated.  All her labs were normal at that time including a TSH.  She tells me that in the morning prior to going to the emergency room she had some shortness of breath and also felt a "weakness in my heart" that she thought might be a tightness.  This only lasted a few hours and was gone by the time she got to the ER.  Troponins and EKG were unremarkable.    She has not had any further episodes of this sensation in her chest since then.  What is concerning her though is that her blood pressure has normalized but her heart rate has been at times as high as 101 to 120 bpm.  She still feels weak and fatigued.  She is convinced all this was due to meloxicam. She was referred for home PT with vestibular rehab.  She was discharged home  with recommendations to followup with Cardiology for workup of CP.   She denies any PND, orthopnea but does describe some shortness of breath with the feeling that she cannot take a deep breath in.  She occasionally some swelling in her feet.  She denies any tobacco use.  She does have a strong family history of premature CAD.  Her father she is estranged from but her mother had CHF and her brother had an MI in his 37s and CHF.  The patient does not have symptoms concerning for COVID-19 infection (fever, chills, cough, or new shortness of breath).    Prior CV studies:   The following studies were reviewed  today:  EKG  Past Medical History:  Diagnosis Date   Backache, unspecified    Hemiplegia, unspecified, affecting unspecified side    Lacunar infarction ([place])    Other and unspecified hyperlipidemia    Retinal defect, unspecified    Rotator cuff injury    right shoulder   Unspecified essential hypertension    Venous tributary (branch) occlusion of retina    Past Surgical History:  Procedure Laterality Date   ABDOMINAL HYSTERECTOMY     CHOLECYSTECTOMY     COLONOSCOPY  01-11-10   per Dr. Laurence Spates, internal hemorrhoids only, repeat in 10 yrs    SPHINCTEROTOMY       Current Meds  Medication Sig   aspirin 325 MG tablet Take 325 mg by mouth as needed.   carboxymethylcellulose (REFRESH PLUS) 0.5 % SOLN Place 1 drop into both eyes daily as needed (dry eyes).   enalapril (VASOTEC) 10 MG tablet Take 0.5 tablets (5 mg total) by mouth 2 (two) times daily.   hydrochlorothiazide (HYDRODIURIL) 25 MG tablet Take 0.5 tablets (12.5 mg total) by mouth daily.   vitamin B-12 (CYANOCOBALAMIN) 1000 MCG tablet Take 1 tablet (1,000 mcg total) by mouth daily.     Allergies:   Amoxicillin and Meloxicam   Social History   Tobacco Use   Smoking status: Never Smoker   Smokeless tobacco: Never Used  Substance Use Topics   Alcohol use: No    Alcohol/week: 0.0 standard drinks   Drug use: No     Family Hx: The patient's family history includes Diabetes in her brother; Heart disease in her brother and mother; Hyperlipidemia in her brother; Hypertension in her brother; Peripheral vascular disease in her brother.  ROS:   Please see the history of present illness.     All other systems reviewed and are negative.   Labs/Other Tests and Data Reviewed:    Recent Labs: 08/11/2018: TSH 1.92 10/30/2018: ALT 8; B Natriuretic Peptide 63.0 10/31/2018: BUN 10; Creatinine, Ser 0.69; Hemoglobin 13.2; Platelets 230; Potassium 4.1; Sodium 138   Recent Lipid Panel Lab Results   Component Value Date/Time   CHOL 196 12/27/2015 09:18 AM   TRIG 227.0 (H) 12/27/2015 09:18 AM   HDL 45.10 12/27/2015 09:18 AM   CHOLHDL 4 12/27/2015 09:18 AM   LDLCALC 131 (H) 10/27/2013 11:49 AM   LDLDIRECT 123.0 12/27/2015 09:18 AM    Wt Readings from Last 3 Encounters:  11/21/18 160 lb (72.6 kg)  10/31/18 160 lb 4.4 oz (72.7 kg)  08/11/18 165 lb (74.8 kg)     Objective:    Vital Signs:  BP 131/73    Pulse (!) 116    Ht 5\' 2"  (1.575 m)    Wt 160 lb (72.6 kg)    BMI 29.26 kg/m    CONSTITUTIONAL:  Well nourished, well developed  female in no acute distress.  EYES: anicteric MOUTH: oral mucosa is pink RESPIRATORY: Normal respiratory effort, symmetric expansion CARDIOVASCULAR: No peripheral edema SKIN: No rash, lesions or ulcers MUSCULOSKELETAL: no digital cyanosis NEURO: Cranial Nerves II-XII grossly intact, moves all extremities PSYCH: Intact judgement and insight.  A&O x 3, Mood/affect appropriate   ASSESSMENT & PLAN:    1.  Chest pain -her chest discomfort is very atypical and very difficult for her to describe.  It seems like it really only happened 1 time and this was in the setting of an acute vertigo exacerbation.  She was short of breath for about short period of time and felt like her heart was "weak with some mild pressure".  This only lasted a few hours.  Work-up in the ER with troponins and EKG was unremarkable.  She does have cardiac risk factors including hypertension as well as a strong family history of premature CAD.  I recommended Lexiscan Myoview to rule out ischemia.  2.  Weakness and fatigue -I am uncertain to what the etiology of this could be.  Her last labs were all normal including hemoglobin TSH and renal function.  Her blood pressure has been fine.  Her heart rate has been elevated some although on EKG in the ER it was 82 bpm.  Her heart rate has been as high as 120 bpm.  I am going to get a 2D echocardiogram to make sure LV function is normal.  3.   Tachycardia -her heart rate was normal at 82 bpm and normal sinus rhythm on EKG last month in the ER.  She says at times her heart rate will get up to 100 -120 bpm.  Unclear whether this is due to anxiety or an arrhythmia.  I am going to get a 30-day event monitor to evaluate further.  4.  Hypertension - her BP is controlled.  She will continue on HCTZ 12.5mg  daily and enalapril 5mg  BID.    5.  COVID-19 Education: The signs and symptoms of COVID-19 were discussed with the patient and how to seek care for testing (follow up with PCP or arrange E-visit).  The importance of social distancing was discussed today.  Patient Risk:   After full review of this patient's clinical status, I feel that they are at least moderate risk at this time.  Time:   Today, I have spent 25 minutes directly with the patient on video discussing medical problems including chest pain and HTN.  We also reviewed the symptoms of COVID 19 and the ways to protect against contracting the virus with telehealth technology.  I spent an additional 5 minutes reviewing patient's chart including hospital records, EKG and labs.  Medication Adjustments/Labs and Tests Ordered: Current medicines are reviewed at length with the patient today.  Concerns regarding medicines are outlined above.  Tests Ordered: No orders of the defined types were placed in this encounter.  Medication Changes: No orders of the defined types were placed in this encounter.   Disposition:  Follow up prn  Signed, Fransico Him, MD  11/21/2018 1:18 PM    [place]

## 2018-11-21 ENCOUNTER — Ambulatory Visit: Payer: Medicare HMO | Admitting: Cardiology

## 2018-11-21 ENCOUNTER — Telehealth (INDEPENDENT_AMBULATORY_CARE_PROVIDER_SITE_OTHER): Payer: Medicare HMO | Admitting: Cardiology

## 2018-11-21 ENCOUNTER — Encounter: Payer: Self-pay | Admitting: Cardiology

## 2018-11-21 ENCOUNTER — Other Ambulatory Visit: Payer: Self-pay

## 2018-11-21 VITALS — BP 131/73 | HR 116 | Ht 62.0 in | Wt 160.0 lb

## 2018-11-21 DIAGNOSIS — R000 Tachycardia, unspecified: Secondary | ICD-10-CM | POA: Diagnosis not present

## 2018-11-21 DIAGNOSIS — R531 Weakness: Secondary | ICD-10-CM | POA: Diagnosis not present

## 2018-11-21 DIAGNOSIS — R079 Chest pain, unspecified: Secondary | ICD-10-CM

## 2018-11-21 DIAGNOSIS — I10 Essential (primary) hypertension: Secondary | ICD-10-CM | POA: Diagnosis not present

## 2018-11-21 DIAGNOSIS — R0602 Shortness of breath: Secondary | ICD-10-CM

## 2018-11-21 DIAGNOSIS — Z7189 Other specified counseling: Secondary | ICD-10-CM

## 2018-11-21 NOTE — Patient Instructions (Addendum)
Medication Instructions:  Your physician recommends that you continue on your current medications as directed. Please refer to the Current Medication list given to you today.  If you need a refill on your cardiac medications before your next appointment, please call your pharmacy.   Lab work: None If you have labs (blood work) drawn today and your tests are completely normal, you will receive your results only by: Marland Kitchen MyChart Message (if you have MyChart) OR . A paper copy in the mail If you have any lab test that is abnormal or we need to change your treatment, we will call you to review the results.  Testing/Procedures: Your physician has requested that you have an echocardiogram. Echocardiography is a painless test that uses sound waves to create images of your heart. It provides your doctor with information about the size and shape of your heart and how well your heart's chambers and valves are working. This procedure takes approximately one hour. There are no restrictions for this procedure.  Your physician has requested that you have a lexiscan myoview. For further information please visit [email or web address]. Please follow instruction sheet, as given.  Your physician has recommended that you wear an event monitor. Event monitors are medical devices that record the heart's electrical activity. Doctors most often [place] these monitors to diagnose arrhythmias. Arrhythmias are problems with the speed or rhythm of the heartbeat. The monitor is a small, portable device. You can wear one while you do your normal daily activities. This is usually used to diagnose what is causing palpitations/syncope (passing out).  Follow-Up: As needed, pending results  Any Other Special Instructions Will Be Listed Below (If Applicable).  Stress Test Instructions   You are scheduled for a Myocardial Perfusion Imaging Study.  Please arrive 15 minutes prior to your appointment time for registration and insurance  purposes.  The test will take approximately 3 to 4 hours to complete; you may bring reading material.  If someone comes with you to your appointment, they will need to remain in the main lobby due to limited space in the testing area. **If you are pregnant or breastfeeding, please notify the nuclear lab prior to your appointment**  How to prepare for your Myocardial Perfusion Test: . Do not eat or drink 3 hours prior to your test, except you may have water. . Do not consume products containing caffeine (regular or decaffeinated) 12 hours prior to your test. (ex: coffee, chocolate, sodas, tea). . Do wear comfortable clothes (no dresses or overalls) and walking shoes, tennis shoes preferred (No heels or open toe shoes are allowed). . Do NOT wear cologne, perfume, aftershave, or lotions (deodorant is allowed). . If these instructions are not followed, your test will have to be rescheduled.  Please report to [address] for your test.  If you have questions or concerns about your appointment, you can call the Nuclear Lab at [phone].  If you cannot keep your appointment, please provide 24 hours notification to the Nuclear Lab, to avoid a possible $50 charge to your account.

## 2018-11-25 ENCOUNTER — Telehealth: Payer: Self-pay | Admitting: Radiology

## 2018-11-25 NOTE — Telephone Encounter (Signed)
Called patient about enrolling her for a 30 day Event monitor. She wasn't sure why she was getting it or why she needed to wear it for that long. She also mentioned she was having a nuclear and echo on 5-22. I tried to explain the difference in all the testing and why the monitor was ordered. She prefers to wait at this time and not have the monitor till possibly after her other testing is complete. She was instructed to please call if she decided to wear the monitor.

## 2018-12-01 ENCOUNTER — Encounter (HOSPITAL_COMMUNITY): Payer: Self-pay | Admitting: *Deleted

## 2018-12-01 ENCOUNTER — Telehealth (HOSPITAL_COMMUNITY): Payer: Self-pay | Admitting: *Deleted

## 2018-12-01 NOTE — Telephone Encounter (Signed)
Left message on voicemail in reference to upcoming Nuclear appointment scheduled for 12/05/18. Phone number given for a call back so details instructions can be given.  [name]

## 2018-12-03 ENCOUNTER — Telehealth (HOSPITAL_COMMUNITY): Payer: Self-pay | Admitting: Cardiology

## 2018-12-03 NOTE — Telephone Encounter (Addendum)
Pt called back returning [name]'s call. Her main concern was that on MyChart, she was told that she may have to call a number upon arrival, and she just was not sure which number to call

## 2018-12-03 NOTE — Telephone Encounter (Signed)
Left message to give echo instructions

## 2018-12-03 NOTE — Telephone Encounter (Signed)

## 2018-12-05 ENCOUNTER — Ambulatory Visit (HOSPITAL_BASED_OUTPATIENT_CLINIC_OR_DEPARTMENT_OTHER): Payer: Medicare HMO

## 2018-12-05 ENCOUNTER — Ambulatory Visit (HOSPITAL_COMMUNITY): Payer: Medicare HMO | Attending: Cardiovascular Disease

## 2018-12-05 ENCOUNTER — Other Ambulatory Visit: Payer: Self-pay

## 2018-12-05 DIAGNOSIS — R0602 Shortness of breath: Secondary | ICD-10-CM | POA: Insufficient documentation

## 2018-12-05 DIAGNOSIS — R079 Chest pain, unspecified: Secondary | ICD-10-CM | POA: Insufficient documentation

## 2018-12-05 LAB — MYOCARDIAL PERFUSION IMAGING
LV dias vol: 44 mL (ref 46–106)
LV sys vol: 13 mL
Peak HR: 113 {beats}/min
Rest HR: 71 {beats}/min
SDS: 3
SRS: 0
SSS: 3
TID: 0.94

## 2018-12-05 MED ORDER — TECHNETIUM TC 99M TETROFOSMIN IV KIT
8.9000 | PACK | Freq: Once | INTRAVENOUS | Status: AC | PRN
  Administered 2018-12-05: 8.9 via INTRAVENOUS
  Filled 2018-12-05: qty 9

## 2018-12-05 MED ORDER — TECHNETIUM TC 99M TETROFOSMIN IV KIT
28.3000 | PACK | Freq: Once | INTRAVENOUS | Status: AC | PRN
  Administered 2018-12-05: 28.3 via INTRAVENOUS
  Filled 2018-12-05: qty 29

## 2018-12-05 MED ORDER — REGADENOSON 0.4 MG/5ML IV SOLN
0.4000 mg | Freq: Once | INTRAVENOUS | Status: AC
  Administered 2018-12-05: 0.4 mg via INTRAVENOUS

## 2018-12-09 ENCOUNTER — Telehealth: Payer: Self-pay | Admitting: *Deleted

## 2018-12-09 NOTE — Telephone Encounter (Signed)
She needs to followup with PCP regarding Enalapril dose.  She still needs the heart monitor as her HR elevated on some of her VS recordings

## 2018-12-09 NOTE — Telephone Encounter (Signed)
Pt has been notified of both Myoview and echo results by phone.   Pt states she is still has weakness and fatigue, as well as low BP at times. I went over test results again with pt in great detail. Pt states she read the reports on MY CHART and had question about the moderate calcification of the AV. I assured the pt that though she may have calcification there is no narrowing  (AS) of the AV and that this was good. She read about the LVEF and thought her heart was functioning at 60%-65%.   I assured the pt that this is normal and this is the heart squeezing action. I assured her this was normal and good. Pt states about the low BP and gave me some readings of : Saturday 5/23 96/66 HR 114, Sunday 5/24 119/67 HR 95, 127/74 HR 84, 122/75 HR 91, Monday 5/25 103/66 HR 110. Pt states to me that she has cut her BP meds in 1/2.   I verified the dose of BP meds, Enalapril on file states 10 mg tab with sig to read take 1/2 tab (5 mg) BID. Pt states no she has Enalapril 20 mg tablet and is taking 1/2 tablet once a day. I again went over the dosage we had on file from Dr. Sarajane Jews. Pt does confirm the HCTZ is correct at 25 mg tablet and taking 1/2 (12.5 mg ) once a day. Pt states she took Meloxicam for 19 days in  Nov 2019. She states she is read there is an interaction between BB and diuretics and that they should not be taken with Meloxicam.   I confirmed the pt is no longer taking Meloxicam and only took it for the 19 days in Nov 2019, pt confirms yes. Pt feels maybe the Meloxicam did something to her body causing her symptoms. She states she watches the TV show the Doctors and a doctor on the TV show took a medicine and then he had problems, so she feels the Meloxicam may have done something to her body. I feel the pt quite anxious on the phone. She asked about the heart monitor if she still needed to wear the monitor.   This has not yet been scheduled as she wanted to do Myoview and echo first. I assured her that I  will send my note to Dr. Radford Pax and her nurse Suezanne Jacquet as well as I will CC Dr. Sarajane Jews in regards to the Enalapril dosage. I assured the pt that Harrietta Guardian RN for Dr. Radford Pax will call back after he has discussed further with Dr. Radford Pax. Pt thanked me for my call and my time.

## 2018-12-10 NOTE — Telephone Encounter (Signed)
Left message to call back  

## 2018-12-11 ENCOUNTER — Encounter: Payer: Self-pay | Admitting: *Deleted

## 2018-12-11 NOTE — Telephone Encounter (Signed)
Tell her to stop the HCTZ completely and report back in 2 weeks

## 2018-12-11 NOTE — Telephone Encounter (Signed)
Message sent to the patient. 

## 2018-12-25 ENCOUNTER — Telehealth: Payer: Self-pay | Admitting: Cardiology

## 2018-12-25 NOTE — Telephone Encounter (Signed)
Spoke with the patient, she had questions about her monitor. She agreed and wanted to go through with the monitor.

## 2018-12-25 NOTE — Telephone Encounter (Signed)
Follow Up:     Pt says she is retuning your call from about 2 or 3 weeks ago.

## 2018-12-26 ENCOUNTER — Telehealth: Payer: Self-pay

## 2018-12-26 DIAGNOSIS — M1990 Unspecified osteoarthritis, unspecified site: Secondary | ICD-10-CM

## 2018-12-26 NOTE — Telephone Encounter (Signed)
Copied from [place] [id number]. Topic: Referral - Request for Referral >> Dec 25, 2018  3:50 PM Virl Axe D wrote: Has patient seen PCP for this complaint? YES *If NO, is insurance requiring patient see PCP for this issue before PCP can refer them? Referral for which specialty: Rheumatology Preferred provider/office: Dr. Leigh Aurora / [phone] / [facility] Rheumatology Reason for referral: Muscle Pain and weakness/joint pain

## 2018-12-26 NOTE — Telephone Encounter (Signed)
Dr. Fry please advise. Thanks  

## 2018-12-29 ENCOUNTER — Telehealth: Payer: Self-pay | Admitting: Radiology

## 2018-12-29 NOTE — Telephone Encounter (Signed)
Tried calling to verify her address/insurance to have monitor mailed.

## 2018-12-29 NOTE — Telephone Encounter (Signed)
The referral was done  

## 2018-12-29 NOTE — Telephone Encounter (Signed)
Returned patients call. I enrolled her for a 30 day [employer] event monitor to be mailed. Brief instructions were gone over with the patient and she knows to expect the monitor to arrive in 3-4 days

## 2018-12-29 NOTE — Telephone Encounter (Signed)
Patient called to return [name]'s call. She will be home all day.

## 2019-01-01 ENCOUNTER — Telehealth: Payer: Self-pay | Admitting: Cardiology

## 2019-01-01 NOTE — Telephone Encounter (Signed)
Follow Up:    Pt would like for you t call her please. She says she still have not heard from anybody about the monitor.

## 2019-01-01 NOTE — Telephone Encounter (Signed)
Called [employer] for patient and verified her address to have monitor shipped. Also returned patients call and answered all her questons

## 2019-01-07 ENCOUNTER — Ambulatory Visit (INDEPENDENT_AMBULATORY_CARE_PROVIDER_SITE_OTHER): Payer: Medicare HMO

## 2019-01-07 DIAGNOSIS — R000 Tachycardia, unspecified: Secondary | ICD-10-CM | POA: Diagnosis not present

## 2019-01-08 ENCOUNTER — Encounter: Payer: Self-pay | Admitting: *Deleted

## 2019-01-09 ENCOUNTER — Encounter: Payer: Self-pay | Admitting: Family Medicine

## 2019-01-09 ENCOUNTER — Ambulatory Visit: Payer: Self-pay | Admitting: *Deleted

## 2019-01-09 ENCOUNTER — Other Ambulatory Visit: Payer: Self-pay

## 2019-01-09 ENCOUNTER — Ambulatory Visit (INDEPENDENT_AMBULATORY_CARE_PROVIDER_SITE_OTHER): Payer: Medicare HMO | Admitting: Family Medicine

## 2019-01-09 VITALS — BP 160/82 | HR 98 | Temp 98.1°F | Wt 153.1 lb

## 2019-01-09 DIAGNOSIS — E538 Deficiency of other specified B group vitamins: Secondary | ICD-10-CM | POA: Diagnosis not present

## 2019-01-09 DIAGNOSIS — E876 Hypokalemia: Secondary | ICD-10-CM | POA: Diagnosis not present

## 2019-01-09 DIAGNOSIS — M353 Polymyalgia rheumatica: Secondary | ICD-10-CM | POA: Diagnosis not present

## 2019-01-09 DIAGNOSIS — I10 Essential (primary) hypertension: Secondary | ICD-10-CM

## 2019-01-09 MED ORDER — PREDNISONE 10 MG PO TABS: 20.0000 mg | ORAL_TABLET | Freq: Every day | ORAL | 0 refills | Status: DC

## 2019-01-09 NOTE — Progress Notes (Signed)
   Subjective:    Patient ID: [name], female    DOB: 1945/08/21, 73 y.o.   MRN: [id number]  HPI Here for several issues. First she complains of feeling very fatigued and weak all over for the past 6 months or so. She also describes stiffness and pain in multiple joints, but this is most prominent in both shoulders and both hips. She had seen Dr. Fredna Dow in November and in January, and she was given Meloxicam and Celecoxib to try. However these made her feel bad and did not really help her symptoms. We discovered her B12 to be low and she started taking 1000 mcg of this daily. Her level corrected nicely (her last level on 10-28-18 was 679), but this did not help her feel any better. We checked inflammatory markers like a ESR in January, but these were normal. Interestingly she was given a Medrol dose pack in February and this helped her feel better than anything else, but when the pack was finished all her symptoms returned. She also notes some fluctuations in her BP the past few months (it ranges 174B to 449Q systolic) and in her pulse rates (70s to 130s). She has seen Dr. Radford Pax about this and she is now wearing a 30 day monitor to gather data.    Review of Systems  Constitutional: Positive for fatigue. Negative for fever.  Respiratory: Negative.   Cardiovascular: Negative.   Musculoskeletal: Positive for arthralgias and myalgias.  Neurological: Positive for weakness.       Objective:   Physical Exam Constitutional:      Appearance: Normal appearance.  Cardiovascular:     Rate and Rhythm: Normal rate and regular rhythm.     Pulses: Normal pulses.     Heart sounds: Normal heart sounds.  Pulmonary:     Effort: Pulmonary effort is normal.     Breath sounds: Normal breath sounds.  Musculoskeletal:        General: No swelling.     Right lower leg: No edema.     Left lower leg: No edema.  Neurological:     General: No focal deficit present.     Mental Status: She is alert and  oriented to person, place, and time.           Assessment & Plan:  She describes muscular weakness, particularly in the shoulder and hip girdles, and she has stiffness and joint pains, again mostly in the hip and shoulder areas. A possible etiology could be polymyalgia rheumatica. We will give hr a trial of Prednisone 20 mg a day for a week to see how she responds. She will follow up next week. As for he concerns about BP and pulse rates, I will leave this up to Dr. Radford Pax to address. Of note she had a slightly low potassium in April and she takes low dose HCTZ, so we will set her up for a BMET next week.  Alysia Penna, MD

## 2019-01-09 NOTE — Telephone Encounter (Signed)
Pt states at the time of the original call  she had just picked it up prescription. Pt states that during the visit the doctorsaid take one pill twice a day. Pt states that the current prescription states to take to 2 tabs to equal 20 mg with breakfast. Pt advised that she would need to follow the instructions on the current prescription that was written by PCP. Noted in office notes that the pt is to take 20 mg daily. Pt wants to know if she could take one pill twice a day.   Reason for Disposition . Caller has medication question only, adult not sick, and triager answers question  Answer Assessment - Initial Assessment Questions 1. SYMPTOMS: "Do you have any symptoms?"     No  2. SEVERITY: If symptoms are present, ask "Are they mild, moderate or severe?"     n/a  Protocols used: MEDICATION QUESTION CALL-A-AH

## 2019-01-12 NOTE — Telephone Encounter (Signed)
Dr. Fry please advise. Thanks  

## 2019-01-13 ENCOUNTER — Encounter: Payer: Self-pay | Admitting: *Deleted

## 2019-01-13 NOTE — Telephone Encounter (Signed)
I would take both pills together once a day

## 2019-01-15 ENCOUNTER — Other Ambulatory Visit: Payer: Self-pay | Admitting: Family Medicine

## 2019-01-31 ENCOUNTER — Other Ambulatory Visit: Payer: Self-pay | Admitting: Family Medicine
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
## 2019-02-05 NOTE — Telephone Encounter (Signed)
Please advise. Should patient continue this medication. 

## 2019-02-06 ENCOUNTER — Emergency Department (HOSPITAL_COMMUNITY): Payer: Medicare HMO

## 2019-02-06 ENCOUNTER — Ambulatory Visit: Payer: Self-pay | Admitting: *Deleted

## 2019-02-06 ENCOUNTER — Emergency Department (HOSPITAL_COMMUNITY)
Admission: EM | Admit: 2019-02-06 | Discharge: 2019-02-07 | Disposition: A | Discharge status: Home / Self Care | Payer: Medicare HMO | Attending: Emergency Medicine | Admitting: Emergency Medicine

## 2019-02-06 ENCOUNTER — Other Ambulatory Visit: Payer: Self-pay

## 2019-02-06 DIAGNOSIS — R079 Chest pain, unspecified: Secondary | ICD-10-CM | POA: Diagnosis not present

## 2019-02-06 DIAGNOSIS — I10 Essential (primary) hypertension: Secondary | ICD-10-CM | POA: Insufficient documentation

## 2019-02-06 DIAGNOSIS — Z5321 Procedure and treatment not carried out due to patient leaving prior to being seen by health care provider: Secondary | ICD-10-CM | POA: Diagnosis not present

## 2019-02-06 LAB — BASIC METABOLIC PANEL
Anion gap: 9 (ref 5–15)
BUN: 14 mg/dL (ref 8–23)
CO2: 25 mmol/L (ref 22–32)
Calcium: 9.3 mg/dL (ref 8.9–10.3)
Chloride: 102 mmol/L (ref 98–111)
Creatinine, Ser: 0.67 mg/dL (ref 0.44–1.00)
GFR calc Af Amer: >60 mL/min (ref >60)
GFR calc non Af Amer: >60 mL/min (ref >60)
Glucose, Bld: 102 mg/dL — ABNORMAL HIGH (ref 70–99)
Potassium: 4.1 mmol/L (ref 3.5–5.1)
Sodium: 136 mmol/L (ref 135–145)

## 2019-02-06 LAB — CBC
HCT: 44.2 % (ref 36.0–46.0)
Hemoglobin: 13.9 g/dL (ref 12.0–15.0)
MCH: 28.1 pg (ref 26.0–34.0)
MCHC: 31.4 g/dL (ref 30.0–36.0)
MCV: 89.3 fL (ref 80.0–100.0)
Platelets: 218 10*3/uL (ref 150–400)
RBC: 4.95 MIL/uL (ref 3.87–5.11)
RDW: 15.8 % — ABNORMAL HIGH (ref 11.5–15.5)
WBC: 9.6 10*3/uL (ref 4.0–10.5)
nRBC: 0 % (ref 0.0–0.2)

## 2019-02-06 LAB — TROPONIN I (HIGH SENSITIVITY)
Troponin I (High Sensitivity): 10 ng/L (ref <18)
Troponin I (High Sensitivity): 12 ng/L (ref <18)

## 2019-02-06 MED ORDER — SODIUM CHLORIDE 0.9% FLUSH: 3.0000 mL | Freq: Once | INTRAVENOUS | Status: DC

## 2019-02-06 NOTE — ED Triage Notes (Signed)
Per pt she called her dr today bc of her blood pressure and he told her to come to ed/ Pt said she had chest pain last night but not today.

## 2019-02-06 NOTE — Telephone Encounter (Signed)
Will send to Dr. Fry as FYI 

## 2019-02-06 NOTE — Telephone Encounter (Signed)
Pt called stating that her BP is elevated; it was 194/90 on 02/06/2019 at 1450; she says that she is unsteady and woozy since 02/05/2019; it was 201/106 on 02/05/2019 at 0500; she was started on prednisone on 01/08/2019 for polymyalgia; she says the steroid has helped but her blood pressure has been elevated; she uses a digital cuff on her right upper arm; the pt says that her numbers are a mixture of numbers; she would like to know if she should continue her prednisone; she feels like her elevated numbers are due to the medication; she wants Dr Barbie Banner input; recommendations made per nurse triage protocol; she verbalized understanding; spoke with Luwanna regarding this encounter, and per her team lead, follow the protocol; pt informed and she will have her daughter take her to the ED; the pt can be contacted at [phone]; will route to office for notification.  Reason for Disposition . [4] Systolic BP  >= 128 OR Diastolic >= 786 AND [7] cardiac or neurologic symptoms (e.g., chest pain, difficulty breathing, unsteady gait, blurred vision)  Answer Assessment - Initial Assessment Questions 1. BLOOD PRESSURE: "What is the blood pressure?" "Did you take at least two measurements 5 minutes apart?"     194/90 on 02/06/2019 and 201/106 on 02/05/2019 2. ONSET: "When did you take your blood pressure?"   02/06/2019 at 1450 and 02/05/2019 at 0500 3. HOW: "How did you obtain the blood pressure?" (e.g., visiting nurse, automatic home BP monitor)     Automatic cuff on right arm 4. HISTORY: "Do you have a history of high blood pressure?"     yes 5. MEDICATIONS: "Are you taking any medications for blood pressure?" "Have you missed any doses recently?"     no 6. OTHER SYMPTOMS: "Do you have any symptoms?" (e.g., headache, chest pain, blurred vision, difficulty breathing, weakness)     Woozy; has to be careful because it feels like she may fall when moving around and walking 7. PREGNANCY: "Is there any chance you are  pregnant?" "When was your last menstrual period?"     no  Protocols used: HIGH BLOOD PRESSURE-A-AH

## 2019-02-07 NOTE — ED Notes (Signed)
PATIENT WANTS TO LEAVE TO GO HOME LEFT WITH.HOUT BEING SEEN. [place]

## 2019-02-10 ENCOUNTER — Other Ambulatory Visit: Payer: Self-pay

## 2019-02-11 ENCOUNTER — Telehealth: Payer: Self-pay | Admitting: *Deleted

## 2019-02-11 DIAGNOSIS — R002 Palpitations: Secondary | ICD-10-CM

## 2019-02-11 NOTE — Telephone Encounter (Signed)
-----   Message from Nuala Alpha, LPN sent at 6/73/4193  8:26 AM EDT -----  ----- Message ----- From: Sueanne Margarita, MD Sent: 02/10/2019   8:28 PM EDT To: Laurey Morale, MD, [facility] Triage  Please let patient know that heart monitor showed occasional extra heart beats from the top and bottom of her heart.  With normal echo and stress test these are benign. Please have her come in for TSH.  Please find out if she has had any palpitations recently.  Average heart rate is normal at 78bpm.

## 2019-02-11 NOTE — Telephone Encounter (Signed)
Pt has been notified of monitor results by phone with verbal understanding. Pt had multiple questions that I help answer for her in regards to her monitor. Pt states she found out after she had the monitor placed that Dr. Sarajane Jews told her she had Polyrheumatica and placed her on prednisone 10 mg BID. Pt said she saw her EKG said it was abnormal. I explained to her the findings of the EKG as well that Dr. Radford Pax would have s/w her if there was anything truly abnormal. Pt also said she started using Raw Barely Grass in her smoothies to give her more energy. I assured the pt I will let Dr. Radford Pax of our conversation today and if she has anything new to add we will call her back, otherwise we will see her for lab work 8/4 for TSH. Pt thanked me for the call and taking the time to explain things to her. The patient has been notified of the result and verbalized understanding.  All questions (if any) were answered. Julaine Hua, CMA 02/11/2019 10:45 AM

## 2019-02-12 ENCOUNTER — Telehealth: Payer: Self-pay

## 2019-02-12 MED ORDER — PREDNISONE 10 MG PO TABS: 20.0000 mg | ORAL_TABLET | Freq: Every day | ORAL | 0 refills | Status: DC

## 2019-02-12 NOTE — Telephone Encounter (Signed)
Can continue for another week. OK to send in 7 days worth and then have her follow up with PCP

## 2019-02-12 NOTE — Telephone Encounter (Signed)
Please advise. Should patient continue this medication?  Per 01/09/2019 OV notes Assessment & Plan:  She describes muscular weakness, particularly in the shoulder and hip girdles, and she has stiffness and joint pains, again mostly in the hip and shoulder areas. A possible etiology could be polymyalgia rheumatica. We will give hr a trial of Prednisone 20 mg a day for a week to see how she responds. She will follow up next week. As for he concerns about BP and pulse rates, I will leave this up to Dr. Radford Pax to address. Of note she had a slightly low potassium in April and she takes low dose HCTZ, so we will set her up for a BMET next week.  Alysia Penna, MD

## 2019-02-12 NOTE — Telephone Encounter (Signed)
Copied from [place] [phone]. Topic: General - Other >> Feb 12, 2019 11:44 AM Keene Breath wrote: Reason for CRM: Patient called to ask the doctor if he wants her to continue taking the Prednisone medication.  Patient stated she only has 2 pills and the prescription says 0 refills.  Please advise and call and let patient know.  CB# [phone]

## 2019-02-12 NOTE — Telephone Encounter (Signed)
Rx has been sent in. Left a detailed message on verified voice mail.  Nothing further needed.

## 2019-02-17 ENCOUNTER — Other Ambulatory Visit: Payer: Self-pay

## 2019-02-17 ENCOUNTER — Other Ambulatory Visit: Payer: Medicare HMO

## 2019-02-17 DIAGNOSIS — R002 Palpitations: Secondary | ICD-10-CM | POA: Diagnosis not present

## 2019-02-18 ENCOUNTER — Telehealth: Payer: Self-pay

## 2019-02-18 LAB — TSH: TSH: 2.49 u[IU]/mL (ref 0.450–4.500)

## 2019-02-18 NOTE — Telephone Encounter (Signed)
-----   Message from Sueanne Margarita, MD sent at 02/18/2019  9:44 AM EDT ----- Please let patient know that labs were normal.  Continue current medical therapy.

## 2019-02-18 NOTE — Telephone Encounter (Signed)
Notes recorded by Frederik Schmidt, RN on 02/18/2019 at 1:24 PM EDT  The patient has been notified of the result and verbalized understanding. All questions (if any) were answered.  Frederik Schmidt, RN 02/18/2019 1:24 PM

## 2019-02-19 ENCOUNTER — Ambulatory Visit: Payer: Medicare HMO | Admitting: Rheumatology

## 2019-02-19 ENCOUNTER — Encounter: Payer: Self-pay | Admitting: Rheumatology

## 2019-02-19 ENCOUNTER — Telehealth: Payer: Self-pay | Admitting: Family Medicine

## 2019-02-19 ENCOUNTER — Telehealth: Payer: Self-pay | Admitting: Pharmacist

## 2019-02-19 ENCOUNTER — Other Ambulatory Visit: Payer: Self-pay

## 2019-02-19 VITALS — BP 177/88 | HR 77 | Resp 14 | Ht 62.0 in | Wt 148.2 lb

## 2019-02-19 DIAGNOSIS — M79642 Pain in left hand: Secondary | ICD-10-CM

## 2019-02-19 DIAGNOSIS — Z87898 Personal history of other specified conditions: Secondary | ICD-10-CM | POA: Diagnosis not present

## 2019-02-19 DIAGNOSIS — M19072 Primary osteoarthritis, left ankle and foot: Secondary | ICD-10-CM

## 2019-02-19 DIAGNOSIS — G5603 Carpal tunnel syndrome, bilateral upper limbs: Secondary | ICD-10-CM | POA: Diagnosis not present

## 2019-02-19 DIAGNOSIS — M353 Polymyalgia rheumatica: Secondary | ICD-10-CM

## 2019-02-19 DIAGNOSIS — E538 Deficiency of other specified B group vitamins: Secondary | ICD-10-CM

## 2019-02-19 DIAGNOSIS — R5383 Other fatigue: Secondary | ICD-10-CM

## 2019-02-19 DIAGNOSIS — M19041 Primary osteoarthritis, right hand: Secondary | ICD-10-CM

## 2019-02-19 DIAGNOSIS — S46001D Unspecified injury of muscle(s) and tendon(s) of the rotator cuff of right shoulder, subsequent encounter: Secondary | ICD-10-CM

## 2019-02-19 DIAGNOSIS — Z79899 Other long term (current) drug therapy: Secondary | ICD-10-CM

## 2019-02-19 DIAGNOSIS — I6381 Other cerebral infarction due to occlusion or stenosis of small artery: Secondary | ICD-10-CM

## 2019-02-19 DIAGNOSIS — M79641 Pain in right hand: Secondary | ICD-10-CM | POA: Diagnosis not present

## 2019-02-19 DIAGNOSIS — M5030 Other cervical disc degeneration, unspecified cervical region: Secondary | ICD-10-CM | POA: Diagnosis not present

## 2019-02-19 DIAGNOSIS — M19071 Primary osteoarthritis, right ankle and foot: Secondary | ICD-10-CM | POA: Diagnosis not present

## 2019-02-19 DIAGNOSIS — M19042 Primary osteoarthritis, left hand: Secondary | ICD-10-CM

## 2019-02-19 MED ORDER — PREDNISONE 5 MG PO TABS: 15.0000 mg | ORAL_TABLET | Freq: Every day | ORAL | 0 refills | Status: DC

## 2019-02-19 NOTE — Telephone Encounter (Signed)
Please advise. Last filled 02/12/2019 Should patient continue this medication?

## 2019-02-19 NOTE — Patient Instructions (Addendum)
Stop taking prednisone 20 mg. Start taking prednisone 15 mg daily with breakfast.  Standing Labs We placed an order today for your standing lab work.    Please come back and get your standing labs after starting methotrexate in 2 weeks, 4 weeks, 8 weeks, then every 3 months.  We have open lab daily Monday through Thursday from 8:30-12:30 PM and 1:30-4:30 PM and Friday from 8:30-12:30 PM and 1:30 -4:00 PM at the office of Dr. Bo Merino.   You may experience shorter wait times on Monday and Friday afternoons. The office is located at [address] [place], [place], [place] [address] No appointment is necessary.   Labs are drawn by [facility].  You may receive a bill from [facility] for your lab work.  If you wish to have your labs drawn at another location, please call the office 24 hours in advance to send orders.  If you have any questions regarding directions or hours of operation,  please call [phone].   Just as a reminder please drink plenty of water prior to coming for your lab work. Thanks! Vaccines You are taking a medication(s) that can suppress your immune system.  The following immunizations are recommended: . Flu annually . Pneumonia (Pneumovax 23 and Prevnar 13 spaced at least 1 year apart) . Shingrix  Please check with your PCP to make sure you are up to date.  Methotrexate tablets What is this medicine? METHOTREXATE (METH oh TREX ate) is a chemotherapy drug used to treat cancer including breast cancer, leukemia, and lymphoma. This medicine can also be used to treat psoriasis and certain kinds of arthritis. This medicine may be used for other purposes; ask your health care provider or pharmacist if you have questions. COMMON BRAND NAME(S): Rheumatrex, Trexall What should I tell my health care provider before I take this medicine? They need to know if you have any of these conditions:  fluid in the stomach area or lungs  if you often drink  alcohol  infection or immune system problems  kidney disease or on hemodialysis  liver disease  low blood counts, like low white cell, platelet, or red cell counts  lung disease  radiation therapy  stomach ulcers  ulcerative colitis  an unusual or allergic reaction to methotrexate, other medicines, foods, dyes, or preservatives  pregnant or trying to get pregnant  breast-feeding How should I use this medicine? Take this medicine by mouth with a glass of water. Follow the directions on the prescription label. Take your medicine at regular intervals. Do not take it more often than directed. Do not stop taking except on your doctor's advice. Make sure you know why you are taking this medicine and how often you should take it. If this medicine is used for a condition that is not cancer, like arthritis or psoriasis, it should be taken weekly, NOT daily. Taking this medicine more often than directed can cause serious side effects, even death. Talk to your healthcare provider about safe handling and disposal of this medicine. You may need to take special precautions. Talk to your pediatrician regarding the use of this medicine in children. While this drug may be prescribed for selected conditions, precautions do apply. Overdosage: If you think you have taken too much of this medicine contact a poison control center or emergency room at once. NOTE: This medicine is only for you. Do not share this medicine with others. What if I miss a dose? If you miss a dose, talk with your doctor  or health care professional. Do not take double or extra doses. What may interact with this medicine? This medicine may interact with the following medication:  acitretin  aspirin and aspirin-like medicines including salicylates  azathioprine  certain antibiotics like penicillins, tetracycline, and chloramphenicol  cyclosporine  gold  hydroxychloroquine  live virus vaccines  NSAIDs, medicines for  pain and inflammation, like ibuprofen or naproxen  other cytotoxic agents  penicillamine  phenylbutazone  phenytoin  probenecid  retinoids such as isotretinoin and tretinoin  steroid medicines like prednisone or cortisone  sulfonamides like sulfasalazine and trimethoprim/sulfamethoxazole  theophylline This list may not describe all possible interactions. Give your health care provider a list of all the medicines, herbs, non-prescription drugs, or dietary supplements you use. Also tell them if you smoke, drink alcohol, or use illegal drugs. Some items may interact with your medicine. What should I watch for while using this medicine? Avoid alcoholic drinks. This medicine can make you more sensitive to the sun. Keep out of the sun. If you cannot avoid being in the sun, wear protective clothing and use sunscreen. Do not use sun lamps or tanning beds/booths. You may need blood work done while you are taking this medicine. Call your doctor or health care professional for advice if you get a fever, chills or sore throat, or other symptoms of a cold or flu. Do not treat yourself. This drug decreases your body's ability to fight infections. Try to avoid being around people who are sick. This medicine may increase your risk to bruise or bleed. Call your doctor or health care professional if you notice any unusual bleeding. Check with your doctor or health care professional if you get an attack of severe diarrhea, nausea and vomiting, or if you sweat a lot. The loss of too much body fluid can make it dangerous for you to take this medicine. Talk to your doctor about your risk of cancer. You may be more at risk for certain types of cancers if you take this medicine. Both men and women must use effective birth control with this medicine. Do not become pregnant while taking this medicine or until at least 1 normal menstrual cycle has occurred after stopping it. Women should inform their doctor if they  wish to become pregnant or think they might be pregnant. Men should not father a child while taking this medicine and for 3 months after stopping it. There is a potential for serious side effects to an unborn child. Talk to your health care professional or pharmacist for more information. Do not breast-feed an infant while taking this medicine. What side effects may I notice from receiving this medicine? Side effects that you should report to your doctor or health care professional as soon as possible:  allergic reactions like skin rash, itching or hives, swelling of the face, lips, or tongue  breathing problems or shortness of breath  diarrhea  dry, nonproductive cough  low blood counts - this medicine may decrease the number of white blood cells, red blood cells and platelets. You may be at increased risk for infections and bleeding.  mouth sores  redness, blistering, peeling or loosening of the skin, including inside the mouth  signs of infection - fever or chills, cough, sore throat, pain or trouble passing urine  signs and symptoms of bleeding such as bloody or black, tarry stools; red or dark-brown urine; spitting up blood or brown material that looks like coffee grounds; red spots on the skin; unusual bruising or bleeding  from the eye, gums, or nose  signs and symptoms of kidney injury like trouble passing urine or change in the amount of urine  signs and symptoms of liver injury like dark yellow or brown urine; general ill feeling or flu-like symptoms; light-colored stools; loss of appetite; nausea; right upper belly pain; unusually weak or tired; yellowing of the eyes or skin Side effects that usually do not require medical attention (report to your doctor or health care professional if they continue or are bothersome):  dizziness  hair loss  tiredness  upset stomach  vomiting This list may not describe all possible side effects. Call your doctor for medical advice about  side effects. You may report side effects to FDA at 1-800-FDA-1088. Where should I keep my medicine? Keep out of the reach of children. Store at room temperature between 20 and 25 degrees C (68 and 77 degrees F). Protect from light. Throw away any unused medicine after the expiration date. NOTE: This sheet is a summary. It may not cover all possible information. If you have questions about this medicine, talk to your doctor, pharmacist, or health care provider.  2020 Elsevier/Gold Standard (2017-02-21 13:38:43)

## 2019-02-19 NOTE — Telephone Encounter (Signed)
See request °

## 2019-02-19 NOTE — Telephone Encounter (Signed)
Dose of methotrexate will be methotrexate 2.5 mg 3 tablets weekly along with folic acid 1 mg daily. Prescription pending lab results.

## 2019-02-19 NOTE — Progress Notes (Signed)
Pharmacy Note  Subjective: Patient presents today to the [facility] to see Dr. Estanislado Pandy.  Patient seen by the pharmacist for counseling on methotrexate for polymyalgia rheumatica. Prior therapy includes:prednisone 20 mg for 1 month from another provider.  Objective: CBC    Component Value Date/Time   WBC 9.6 02/06/2019 1820   RBC 4.95 02/06/2019 1820   HGB 13.9 02/06/2019 1820   HCT 44.2 02/06/2019 1820   PLT 218 02/06/2019 1820   MCV 89.3 02/06/2019 1820   MCH 28.1 02/06/2019 1820   MCHC 31.4 02/06/2019 1820   RDW 15.8 (H) 02/06/2019 1820   LYMPHSABS 2.3 10/30/2018 1249   MONOABS 0.6 10/30/2018 1249   EOSABS 0.1 10/30/2018 1249   BASOSABS 0.0 10/30/2018 1249    CMP     Component Value Date/Time   NA 136 02/06/2019 1820   K 4.1 02/06/2019 1820   CL 102 02/06/2019 1820   CO2 25 02/06/2019 1820   GLUCOSE 102 (H) 02/06/2019 1820   BUN 14 02/06/2019 1820   CREATININE 0.67 02/06/2019 1820   CALCIUM 9.3 02/06/2019 1820   PROT 7.0 10/30/2018 1249   ALBUMIN 3.5 10/30/2018 1249   AST 20 10/30/2018 1249   ALT 8 10/30/2018 1249   ALKPHOS 60 10/30/2018 1249   BILITOT 0.9 10/30/2018 1249   GFRNONAA >60 02/06/2019 1820   GFRAA >60 02/06/2019 1820    Baseline Immunosuppressant Therapy Labs TB GOLD   Hepatitis Panel   HIV No results found for: HIV Immunoglobulins   SPEP Serum Protein Electrophoresis Latest Ref Rng & Units 10/30/2018  Total Protein 6.5 - 8.1 g/dL 7.0   G6PD No results found for: G6PDH TPMT No results found for: TPMT   Chest-xray:  No acute abnormalities of the lungs  Contraception: hysterectomy  Alcohol use: Discussed   Assessment/Plan:   Patient was counseled on the purpose, proper use, and adverse effects of methotrexate including nausea, infection, and signs and symptoms of pneumonitis. Discussed that there is the possibility of an increased risk of malignancy, specifically lymphomas, but it is not well understood if this  increased risk is due to the medication or the disease state.  Instructed patient that medication should be held for infection and prior to surgery.  Advised patient to avoid live vaccines. Recommend annual influenza, Pneumovax 23, Prevnar 13, and Shingrix as indicated.   Reviewed instructions with patient to take methotrexate weekly along with folic acid daily.  Discussed the importance of frequent monitoring of kidney and liver function and blood counts, and provided patient with standing lab instructions.  Counseled patient to avoid NSAIDs and alcohol while on methotrexate.  Provided patient with educational materials on methotrexate and answered all questions.   Patient voiced understanding.  Patient consented to methotrexate use.  Will upload into chart.    Dose of methotrexate will be methotrexate 2.5 mg 3 tablets weekly along with folic acid 1 mg daily. Prescription pending lab results.  She is currently on prednisone 20 mg.  The plan is to decrease to 15 mg x 1 month, 10 mg x 1 month, then taper by 1 mg every month.  Prescription sent for 15 mg daily for 30 days supply.  All questions encouraged and answered.  Instructed patient to call with any further questions or concerns.  Mariella Saa, PharmD, [place], [place] Clinical Specialty Pharmacist [phone]  02/19/2019 11:40 AM

## 2019-02-19 NOTE — Telephone Encounter (Signed)
She saw Dr. Estanislado Pandy in Rheumatology today, and she will now be in charge of this

## 2019-02-19 NOTE — Telephone Encounter (Signed)
Left message for patient to call back. CRM created. Please let patient know that dr. Sarajane Jews no longer handles this prescription for her.

## 2019-02-19 NOTE — Telephone Encounter (Signed)
Relation to ZC:KICH  Call back number:  [phone]   Pharmacy: [id number] - [place], [address] AT [address] [phone] (Phone) [phone] (Fax)     Reason for call:  Patient requesting predniSONE (DELTASONE) 10 MG tablet. Informed patient please allow 48 hour turn around time.

## 2019-02-24 MED ORDER — METHOTREXATE 2.5 MG PO TABS: 7.5000 mg | ORAL_TABLET | ORAL | 0 refills | Status: DC

## 2019-02-24 MED ORDER — FOLIC ACID 1 MG PO TABS: 1.0000 mg | ORAL_TABLET | Freq: Every day | ORAL | 3 refills | Status: DC

## 2019-02-24 NOTE — Telephone Encounter (Signed)
Patient transferred to my desk as she had questions about MTX.  She stated she was hesitant to try MTX because of the side effects.  Reviewed the side effect profile of MTX again in depth.    She is still willing to proceed with MTX at this time.  Informed patient that we will discuss her labs at her follow up appointment on 9/8.  She is to come back in two weeks for lab work.  She was instructed to take methotrexate 3 tablets once a week and folic acid 1 mg daily. She is to continue prednisone taper as discussed at last office visit. Patient verbalized understanding.  All questions encouraged and answered.  Instructed patient to call with any further questions or concerns.  Mariella Saa, PharmD, [place], [place] Clinical Specialty Pharmacist [phone]  02/24/2019 2:36 PM

## 2019-02-24 NOTE — Telephone Encounter (Signed)
Lab results received prescription sent to the pharmacy.

## 2019-02-25 LAB — PROTEIN ELECTROPHORESIS, SERUM, WITH REFLEX
Albumin ELP: 3.5 g/dL — ABNORMAL LOW (ref 3.8–4.8)
Alpha 1: 0.3 g/dL (ref 0.2–0.3)
Alpha 2: 0.6 g/dL (ref 0.5–0.9)
Beta 2: 0.4 g/dL (ref 0.2–0.5)
Beta Globulin: 0.4 g/dL (ref 0.4–0.6)
Gamma Globulin: 1 g/dL (ref 0.8–1.7)
Total Protein: 6.2 g/dL (ref 6.1–8.1)

## 2019-02-25 LAB — IGG, IGA, IGM
IgG (Immunoglobin G), Serum: 1045 mg/dL (ref 600–1540)
IgM, Serum: 149 mg/dL (ref 50–300)
Immunoglobulin A: 336 mg/dL — ABNORMAL HIGH (ref 70–320)

## 2019-02-25 LAB — ANA: Anti Nuclear Antibody (ANA): NEGATIVE

## 2019-02-25 LAB — CYCLIC CITRUL PEPTIDE ANTIBODY, IGG: Cyclic Citrullin Peptide Ab: <16 UNITS

## 2019-02-25 LAB — QUANTIFERON-TB GOLD PLUS
Mitogen-NIL: >10 IU/mL
NIL: 0.02 IU/mL
QuantiFERON-TB Gold Plus: NEGATIVE
TB1-NIL: 0 IU/mL
TB2-NIL: 0 IU/mL

## 2019-02-25 LAB — IFE INTERPRETATION: Immunofix Electr Int: NOT DETECTED

## 2019-02-25 LAB — 14-3-3 ETA PROTEIN: 14-3-3 eta Protein: <0.2 ng/mL (ref <0.2)

## 2019-02-25 LAB — HIV ANTIBODY (ROUTINE TESTING W REFLEX): HIV 1&2 Ab, 4th Generation: NONREACTIVE

## 2019-02-25 LAB — RHEUMATOID FACTOR: Rheumatoid fact SerPl-aCnc: 36 IU/mL — ABNORMAL HIGH (ref <14)

## 2019-02-25 LAB — SEDIMENTATION RATE: Sed Rate: 2 mm/h (ref 0–30)

## 2019-02-25 LAB — CK: Total CK: 104 U/L (ref 29–143)

## 2019-02-25 NOTE — Telephone Encounter (Signed)
Left message for patient to call back  

## 2019-03-02 ENCOUNTER — Other Ambulatory Visit: Payer: Self-pay | Admitting: Family Medicine

## 2019-03-02 NOTE — Telephone Encounter (Signed)
Left message for patient to call back  

## 2019-03-09 NOTE — Telephone Encounter (Signed)
Unable to reach patient.
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
## 2019-03-24 ENCOUNTER — Other Ambulatory Visit: Payer: Self-pay

## 2019-03-24 ENCOUNTER — Encounter: Payer: Self-pay | Admitting: Rheumatology

## 2019-03-24 ENCOUNTER — Ambulatory Visit: Payer: Medicare HMO | Admitting: Rheumatology

## 2019-03-24 VITALS — BP 132/80 | HR 91 | Resp 13 | Ht 62.0 in | Wt 143.4 lb

## 2019-03-24 DIAGNOSIS — Z7952 Long term (current) use of systemic steroids: Secondary | ICD-10-CM | POA: Diagnosis not present

## 2019-03-24 DIAGNOSIS — G5603 Carpal tunnel syndrome, bilateral upper limbs: Secondary | ICD-10-CM | POA: Diagnosis not present

## 2019-03-24 DIAGNOSIS — Z87898 Personal history of other specified conditions: Secondary | ICD-10-CM

## 2019-03-24 DIAGNOSIS — S46001D Unspecified injury of muscle(s) and tendon(s) of the rotator cuff of right shoulder, subsequent encounter: Secondary | ICD-10-CM

## 2019-03-24 DIAGNOSIS — M19071 Primary osteoarthritis, right ankle and foot: Secondary | ICD-10-CM

## 2019-03-24 DIAGNOSIS — I6381 Other cerebral infarction due to occlusion or stenosis of small artery: Secondary | ICD-10-CM

## 2019-03-24 DIAGNOSIS — M19041 Primary osteoarthritis, right hand: Secondary | ICD-10-CM

## 2019-03-24 DIAGNOSIS — M5030 Other cervical disc degeneration, unspecified cervical region: Secondary | ICD-10-CM

## 2019-03-24 DIAGNOSIS — R768 Other specified abnormal immunological findings in serum: Secondary | ICD-10-CM

## 2019-03-24 DIAGNOSIS — M19072 Primary osteoarthritis, left ankle and foot: Secondary | ICD-10-CM

## 2019-03-24 DIAGNOSIS — E538 Deficiency of other specified B group vitamins: Secondary | ICD-10-CM

## 2019-03-24 DIAGNOSIS — M19042 Primary osteoarthritis, left hand: Secondary | ICD-10-CM

## 2019-03-24 DIAGNOSIS — Z79899 Other long term (current) drug therapy: Secondary | ICD-10-CM

## 2019-03-24 DIAGNOSIS — M353 Polymyalgia rheumatica: Secondary | ICD-10-CM

## 2019-03-24 NOTE — Patient Instructions (Addendum)
Please start taking methotrexate 3 tablets every 7 days.  Please start folic acid 1 tablet daily.  Please start prednisone 10 mg (2 tablets) daily for 30 days.  Call office for refill of decreased dose of prednisone 9 mg (one 5 mg tablet and four 1 mg tablets) daily for 30 days, then prednisone 8 mg (one 5 mg tablet and three 1 mg tablets) daily for 30 days.  Refer to calendar if needed.  Standing Labs We placed an order today for your standing lab work.    Please come back and get your standing labs in October and then January.  We have open lab daily Monday through Thursday from 8:30-12:30 PM and 1:30-4:30 PM and Friday from 8:30-12:30 PM and 1:30 -4:00 PM at the office of Dr. Bo Merino.   You may experience shorter wait times on Monday and Friday afternoons. The office is located at [address], [place], [place], [place] [address] No appointment is necessary.   Labs are drawn by [facility].  You may receive a bill from [facility] for your lab work.  If you wish to have your labs drawn at another location, please call the office 24 hours in advance to send orders.  If you have any questions regarding directions or hours of operation,  please call [phone].   Just as a reminder please drink plenty of water prior to coming for your lab work. Thanks!

## 2019-03-24 NOTE — Progress Notes (Signed)
Pharmacy Note  Subjective:  Patient presents today to [facility] to see Dr. Estanislado Pandy.   Patient seen by the pharmacist for counseling on methotrexate for PMR as patient had difficulty remembering her dosing schedule.  Objective: Current Outpatient Medications on File Prior to Visit  Medication Sig Dispense Refill  . enalapril (VASOTEC) 20 MG tablet TAKE 1 TABLET BY MOUTH EVERY DAY (Patient taking differently: Take 10 mg by mouth daily. ) 90 tablet 0  . hydrochlorothiazide (HYDRODIURIL) 25 MG tablet TAKE 1 TABLET BY MOUTH EVERY DAY 90 tablet 1  . Multiple Vitamin (MULTIVITAMIN PO) Take by mouth daily.    . vitamin B-12 (CYANOCOBALAMIN) 1000 MCG tablet Take 1 tablet (1,000 mcg total) by mouth daily. 90 tablet 1  . predniSONE (DELTASONE) 5 MG tablet Take 3 tablets (15 mg total) by mouth daily with breakfast. (Patient not taking: Reported on 03/24/2019) 90 tablet 0   No current facility-administered medications on file prior to visit.      Assessment/Plan:  Reviewed  proper use, storage,  and dosing of Methotrexate.  Counseled patient on avoiding live vaccines, discontinuing methotrexate for surgical procedures, and any time patient is placed on Antibiotics or has infection. Also counseled on the importance of lab monitor.  She was instructed to return in 1 month for labs and then every 3 months.  Patient consented to Methotrexate. Will upload consent in the media tab. Standing orders placed for lab monitor all questions were encouraged and answered.   Patient also stopped prednisone 15 mg daily due to dizziness and elevated blood pressure.  Her dose has been decreased to 10 mg daily for 30 days and then decrease by 1 mg every month.  Advised patient to call our office if she is experiencing side effects prior to stopping medication.  Mariella Saa, PharmD, [place], [place] Clinical Specialty Pharmacist [phone]  03/24/2019 3:19 PM

## 2019-03-30 ENCOUNTER — Other Ambulatory Visit: Payer: Self-pay | Admitting: Rheumatology

## 2019-03-30 NOTE — Telephone Encounter (Signed)
Last Visit: 03/24/19 Next Visit: 06/24/19 Labs: 02/06/19  Okay to refill per Dr. Estanislado Pandy

## 2019-04-02 ENCOUNTER — Other Ambulatory Visit: Payer: Self-pay | Admitting: Rheumatology

## 2019-04-02 DIAGNOSIS — M353 Polymyalgia rheumatica: Secondary | ICD-10-CM

## 2019-04-02 NOTE — Telephone Encounter (Signed)
Attempted to contact the patient and left message for patient to call the office.  

## 2019-04-03 ENCOUNTER — Telehealth: Payer: Self-pay | Admitting: Rheumatology

## 2019-04-03 NOTE — Telephone Encounter (Signed)
She needs labs now then in 2 months followed by every 3 months.

## 2019-04-03 NOTE — Telephone Encounter (Signed)
Patient advised She needs labs now then in 2 months followed by every 3 months.

## 2019-04-03 NOTE — Telephone Encounter (Signed)
Patient called requesting a return to let her know when she is due for labwork.

## 2019-04-03 NOTE — Telephone Encounter (Signed)
Patient is currently on 10 mg until 04/23/19  Last Visit: 03/24/19 Next visit: 06/24/19  Okay to refill per Dr. Estanislado Pandy

## 2019-04-03 NOTE — Telephone Encounter (Signed)
Spoke with patient and she was due to go get labs the day of her appointment on 03/24/19 and she was unable to find the lab. Patient restarted her MTX that week. Patient is due to get labs on April 23, 2019. Patient would like to know if you want her get get labs now because she missed getting the labs on 03/24/19 or wait until 04/23/19. Please advise.

## 2019-04-03 NOTE — Telephone Encounter (Signed)
Attempted to contact the patient and left message for patient to call the office.  

## 2019-04-07 ENCOUNTER — Other Ambulatory Visit: Payer: Self-pay

## 2019-04-07 DIAGNOSIS — Z79899 Other long term (current) drug therapy: Secondary | ICD-10-CM

## 2019-04-08 LAB — COMPLETE METABOLIC PANEL WITH GFR
AG Ratio: 1.4 (calc) (ref 1.0–2.5)
ALT: 9 U/L (ref 6–29)
AST: 17 U/L (ref 10–35)
Albumin: 3.6 g/dL (ref 3.6–5.1)
Alkaline phosphatase (APISO): 53 U/L (ref 37–153)
BUN: 21 mg/dL (ref 7–25)
CO2: 29 mmol/L (ref 20–32)
Calcium: 9.1 mg/dL (ref 8.6–10.4)
Chloride: 104 mmol/L (ref 98–110)
Creat: 0.82 mg/dL (ref 0.60–0.93)
GFR, Est African American: 83 mL/min/{1.73_m2} (ref >=60)
GFR, Est Non African American: 71 mL/min/{1.73_m2} (ref >=60)
Globulin: 2.5 g/dL (calc) (ref 1.9–3.7)
Glucose, Bld: 94 mg/dL (ref 65–99)
Potassium: 4 mmol/L (ref 3.5–5.3)
Sodium: 142 mmol/L (ref 135–146)
Total Bilirubin: 0.4 mg/dL (ref 0.2–1.2)
Total Protein: 6.1 g/dL (ref 6.1–8.1)

## 2019-04-08 LAB — CBC WITH DIFFERENTIAL/PLATELET
Absolute Monocytes: 734 cells/uL (ref 200–950)
Basophils Absolute: 82 cells/uL (ref 0–200)
Basophils Relative: 0.8 %
Eosinophils Absolute: 71 cells/uL (ref 15–500)
Eosinophils Relative: 0.7 %
HCT: 41.8 % (ref 35.0–45.0)
Hemoglobin: 13.3 g/dL (ref 11.7–15.5)
Lymphs Abs: 2530 cells/uL (ref 850–3900)
MCH: 28.2 pg (ref 27.0–33.0)
MCHC: 31.8 g/dL — ABNORMAL LOW (ref 32.0–36.0)
MCV: 88.6 fL (ref 80.0–100.0)
MPV: 10.5 fL (ref 7.5–12.5)
Monocytes Relative: 7.2 %
Neutro Abs: 6783 cells/uL (ref 1500–7800)
Neutrophils Relative %: 66.5 %
Platelets: 262 10*3/uL (ref 140–400)
RBC: 4.72 10*6/uL (ref 3.80–5.10)
RDW: 15.1 % — ABNORMAL HIGH (ref 11.0–15.0)
Total Lymphocyte: 24.8 %
WBC: 10.2 10*3/uL (ref 3.8–10.8)

## 2019-04-16 ENCOUNTER — Telehealth: Payer: Self-pay | Admitting: Rheumatology

## 2019-04-16 NOTE — Telephone Encounter (Signed)
Patient called stating she is taking Methotrexate and Prednisone and has started noticing her teeth are very sensitive to cold and asking if the combination of medications could be affecting this.  Patient is also asking if Dr. Estanislado Pandy recommends that she get the flu vaccine.

## 2019-04-16 NOTE — Telephone Encounter (Signed)
Returned patient's call.  Informed patient that methotrexate and prednisone should not cause sensitivity to cold.  Patient states she read that prednisone can affect the bones and would have the same effect on the teeth.  Informed patient that prednisone causes osteoporosis when used long-term and should not be causing teeth sensitivity.  Patient verbalized understanding.  Advised patient that we recommend her to get the flu vaccine.  Patient is hesitant as she knows her medications because immunosuppression and will getting the flu shot make her sick.  Informed that she cannot get a live vaccine but the flu vaccine is inactivated and she cannot get the flu from the flu vaccine.  Encouraged patient to get the flu vaccine in the next few weeks.  Patient verbalized understanding.  All questions encouraged and answered.  Instructed patient to call with any other questions or concerns.  Mariella Saa, PharmD, [place], [place] Clinical Specialty Pharmacist [phone]  04/16/2019 2:33 PM

## 2019-04-22 ENCOUNTER — Telehealth: Payer: Self-pay | Admitting: Rheumatology

## 2019-04-22 MED ORDER — PREDNISONE 1 MG PO TABS: 4.0000 mg | ORAL_TABLET | Freq: Every day | ORAL | 0 refills | Status: DC

## 2019-04-22 MED ORDER — PREDNISONE 5 MG PO TABS: 5.0000 mg | ORAL_TABLET | Freq: Every day | ORAL | 0 refills | Status: DC

## 2019-04-22 NOTE — Telephone Encounter (Signed)
Patient called requesting prescription refill of Prednisone.  Patient states she is due to decrease from 10 mg to 9 mg starting tomorrow.  Patient is requesting the prescription be sent today if possible to [facility] [place].    Patient is also requesting a return call to let her know when she is due for labwork.

## 2019-04-22 NOTE — Telephone Encounter (Signed)
Last Visit: 03/24/19 Next visit: 06/24/19  Okay to refill per Dr. Estanislado Pandy

## 2019-04-23 ENCOUNTER — Other Ambulatory Visit: Payer: Self-pay | Admitting: Rheumatology

## 2019-04-23 NOTE — Telephone Encounter (Signed)
Last Visit: 03/24/19 Next visit: 06/24/19 Labs: 04/08/19 WNL  Okay to refill per Dr. Estanislado Pandy

## 2019-05-15 ENCOUNTER — Other Ambulatory Visit: Payer: Self-pay | Admitting: Rheumatology

## 2019-05-15 NOTE — Telephone Encounter (Signed)
Last Visit: 03/24/19 Next visit: 06/24/19  Attempted to contact the patient and left message for patient to call the office. Need to verify patient's dose of Prednisone.

## 2019-05-18 NOTE — Telephone Encounter (Signed)
Attempted to contact the patient and left message for patient to call the office.  

## 2019-05-19 ENCOUNTER — Telehealth: Payer: Self-pay | Admitting: Rheumatology

## 2019-05-19 NOTE — Telephone Encounter (Signed)
Patient called stating she was returning your call.  Patient states she is taking 9 mg of Prednisone and will change to 8 mg on Saturday, 05/23/19.

## 2019-05-20 NOTE — Telephone Encounter (Signed)
Patient currently taking 9 mg of Prednisone and will change to 8 mg on Saturday, 05/23/19.    Okay to refill per Dr. Estanislado Pandy.

## 2019-05-20 NOTE — Telephone Encounter (Signed)
Noted and prescription sent to the pharmacy.

## 2019-05-28 ENCOUNTER — Other Ambulatory Visit: Payer: Self-pay | Admitting: Rheumatology

## 2019-05-28 NOTE — Telephone Encounter (Signed)
Last Visit: 03/24/19 Next visit: 06/24/19 Labs: 04/08/19 WNL  Okay to refill per Dr. Estanislado Pandy

## 2019-05-30 ENCOUNTER — Other Ambulatory Visit: Payer: Self-pay | Admitting: Family Medicine

## 2019-05-30 DIAGNOSIS — R69 Illness, unspecified: Secondary | ICD-10-CM | POA: Diagnosis not present

## 2019-06-08 ENCOUNTER — Other Ambulatory Visit: Payer: Self-pay

## 2019-06-08 ENCOUNTER — Telehealth: Payer: Self-pay | Admitting: Rheumatology

## 2019-06-08 DIAGNOSIS — Z79899 Other long term (current) drug therapy: Secondary | ICD-10-CM

## 2019-06-08 NOTE — Telephone Encounter (Signed)
Patient called requesting her labwork orders be sent to [facility] on [address].  Patient states she will try to go after Thanksgiving.

## 2019-06-08 NOTE — Telephone Encounter (Signed)
Lab orders have been released for [facility].  

## 2019-06-16 DIAGNOSIS — Z79899 Other long term (current) drug therapy: Secondary | ICD-10-CM | POA: Diagnosis not present

## 2019-06-17 LAB — CBC WITH DIFFERENTIAL/PLATELET
Absolute Monocytes: 614 cells/uL (ref 200–950)
Basophils Absolute: 38 cells/uL (ref 0–200)
Basophils Relative: 0.4 %
Eosinophils Absolute: 58 cells/uL (ref 15–500)
Eosinophils Relative: 0.6 %
HCT: 42.4 % (ref 35.0–45.0)
Hemoglobin: 13.6 g/dL (ref 11.7–15.5)
Lymphs Abs: 2026 cells/uL (ref 850–3900)
MCH: 28.8 pg (ref 27.0–33.0)
MCHC: 32.1 g/dL (ref 32.0–36.0)
MCV: 89.8 fL (ref 80.0–100.0)
MPV: 10.4 fL (ref 7.5–12.5)
Monocytes Relative: 6.4 %
Neutro Abs: 6864 cells/uL (ref 1500–7800)
Neutrophils Relative %: 71.5 %
Platelets: 239 10*3/uL (ref 140–400)
RBC: 4.72 10*6/uL (ref 3.80–5.10)
RDW: 14.5 % (ref 11.0–15.0)
Total Lymphocyte: 21.1 %
WBC: 9.6 10*3/uL (ref 3.8–10.8)

## 2019-06-17 LAB — COMPLETE METABOLIC PANEL WITH GFR
AG Ratio: 1.3 (calc) (ref 1.0–2.5)
ALT: 11 U/L (ref 6–29)
AST: 22 U/L (ref 10–35)
Albumin: 3.8 g/dL (ref 3.6–5.1)
Alkaline phosphatase (APISO): 55 U/L (ref 37–153)
BUN: 16 mg/dL (ref 7–25)
CO2: 29 mmol/L (ref 20–32)
Calcium: 9.7 mg/dL (ref 8.6–10.4)
Chloride: 102 mmol/L (ref 98–110)
Creat: 0.8 mg/dL (ref 0.60–0.93)
GFR, Est African American: 85 mL/min/{1.73_m2} (ref >=60)
GFR, Est Non African American: 74 mL/min/{1.73_m2} (ref >=60)
Globulin: 2.9 g/dL (calc) (ref 1.9–3.7)
Glucose, Bld: 91 mg/dL (ref 65–99)
Potassium: 4 mmol/L (ref 3.5–5.3)
Sodium: 141 mmol/L (ref 135–146)
Total Bilirubin: 0.5 mg/dL (ref 0.2–1.2)
Total Protein: 6.7 g/dL (ref 6.1–8.1)

## 2019-06-18 DIAGNOSIS — H40013 Open angle with borderline findings, low risk, bilateral: Secondary | ICD-10-CM | POA: Diagnosis not present

## 2019-06-18 DIAGNOSIS — H25813 Combined forms of age-related cataract, bilateral: Secondary | ICD-10-CM | POA: Diagnosis not present

## 2019-06-18 DIAGNOSIS — H40053 Ocular hypertension, bilateral: Secondary | ICD-10-CM | POA: Diagnosis not present

## 2019-06-18 DIAGNOSIS — H25043 Posterior subcapsular polar age-related cataract, bilateral: Secondary | ICD-10-CM | POA: Diagnosis not present

## 2019-06-18 DIAGNOSIS — H52223 Regular astigmatism, bilateral: Secondary | ICD-10-CM | POA: Diagnosis not present

## 2019-06-18 DIAGNOSIS — H524 Presbyopia: Secondary | ICD-10-CM | POA: Diagnosis not present

## 2019-06-18 DIAGNOSIS — H5213 Myopia, bilateral: Secondary | ICD-10-CM | POA: Diagnosis not present

## 2019-06-18 DIAGNOSIS — H59812 Chorioretinal scars after surgery for detachment, left eye: Secondary | ICD-10-CM | POA: Diagnosis not present

## 2019-06-19 ENCOUNTER — Other Ambulatory Visit: Payer: Self-pay | Admitting: Rheumatology

## 2019-06-19 NOTE — Telephone Encounter (Signed)
Last Visit: 03/24/2019 Next Visit: 08/12/2019 Labs: 06/16/2019 WNL   Okay to refill per Dr. Estanislado Pandy.

## 2019-06-20 ENCOUNTER — Other Ambulatory Visit: Payer: Self-pay | Admitting: Family Medicine

## 2019-06-22 ENCOUNTER — Other Ambulatory Visit: Payer: Self-pay | Admitting: Rheumatology

## 2019-06-22 NOTE — Telephone Encounter (Signed)
Patient need to schedule an ov and lab or more refills. Called pt at both provided numbers no answer.

## 2019-06-23 NOTE — Telephone Encounter (Signed)
Last Visit: 03/24/2019 Next Visit: 08/12/2019  Prednisone is being tapered by 1mg  each month.   Okay to refill per Dr. Estanislado Pandy.

## 2019-06-24 ENCOUNTER — Encounter: Payer: Self-pay | Admitting: Family Medicine

## 2019-06-24 ENCOUNTER — Ambulatory Visit (INDEPENDENT_AMBULATORY_CARE_PROVIDER_SITE_OTHER): Payer: Medicare HMO | Admitting: Family Medicine

## 2019-06-24 ENCOUNTER — Other Ambulatory Visit: Payer: Self-pay

## 2019-06-24 ENCOUNTER — Ambulatory Visit: Payer: Medicare HMO | Admitting: Rheumatology

## 2019-06-24 VITALS — BP 180/80 | HR 80 | Temp 97.6°F | Ht 62.0 in | Wt 154.0 lb

## 2019-06-24 DIAGNOSIS — R1032 Left lower quadrant pain: Secondary | ICD-10-CM | POA: Diagnosis not present

## 2019-06-24 DIAGNOSIS — I10 Essential (primary) hypertension: Secondary | ICD-10-CM | POA: Diagnosis not present

## 2019-06-24 DIAGNOSIS — E538 Deficiency of other specified B group vitamins: Secondary | ICD-10-CM | POA: Diagnosis not present

## 2019-06-24 DIAGNOSIS — Z23 Encounter for immunization: Secondary | ICD-10-CM | POA: Diagnosis not present

## 2019-06-24 MED ORDER — VITAMIN B-12 1000 MCG PO TABS: 1000.0000 ug | ORAL_TABLET | Freq: Every day | ORAL | 3 refills | Status: DC

## 2019-06-24 NOTE — Progress Notes (Signed)
   Subjective:    Patient ID: Read Drivers, female    DOB: 1945-11-16, 73 y.o.   MRN: [id number]  HPI Here to follow up on B12 deficiency and to discuss an abdominal pain that began about 4 weeks ago but which seems to be going away. She takes b12 tablets daily. Her last level was a year ago. The LLQ pain began suddenly after no known trauma. It has a sharp or "tearing" quality to it, and it is brief and lancinating in nature. She found that twisting her body would make it worse. No change in urinary or bowel habits. No fever. Over the last week it has greatly improved, and she has not felt it today at all.    Review of Systems  Constitutional: Negative.   Respiratory: Negative.   Cardiovascular: Negative.   Gastrointestinal: Positive for abdominal pain. Negative for abdominal distention, anal bleeding, blood in stool, constipation, diarrhea, nausea, rectal pain and vomiting.  Genitourinary: Negative.   Neurological: Negative.        Objective:   Physical Exam Constitutional:      Appearance: Normal appearance. She is not ill-appearing.  Cardiovascular:     Rate and Rhythm: Normal rate and regular rhythm.     Pulses: Normal pulses.     Heart sounds: Normal heart sounds.  Pulmonary:     Effort: Pulmonary effort is normal.     Breath sounds: Normal breath sounds.  Abdominal:     General: Abdomen is flat. Bowel sounds are normal. There is no distension.     Palpations: Abdomen is soft. There is no mass.     Tenderness: There is no abdominal tenderness. There is no guarding.     Hernia: No hernia is present.  Neurological:     General: No focal deficit present.     Mental Status: She is alert and oriented to person, place, and time.           Assessment & Plan:  For the B12 deficiency, we will renew the prescription and will check a level today. The abdominal pain seems to have been the result of a muscular strain. It seems to be healing and resolving. Recheck prn.  Alysia Penna, MD

## 2019-06-24 NOTE — Patient Instructions (Signed)
Health Maintenance Due  Topic Date Due  . Hepatitis C Screening  Nov 13, 1945  . TETANUS/TDAP  06/22/1965  . PNA vac Low Risk Adult (1 of 2 - PCV13) 06/23/2011  . MAMMOGRAM  04/24/2019    Depression screen [facility] 2/9 05/02/2018 02/15/2017 03/16/2015  Decreased Interest 0 0 0  Down, Depressed, Hopeless 0 0 0  PHQ - 2 Score 0 0 0

## 2019-06-25 LAB — BASIC METABOLIC PANEL
BUN: 18 mg/dL (ref 6–23)
CO2: 28 mEq/L (ref 19–32)
Calcium: 9.3 mg/dL (ref 8.4–10.5)
Chloride: 103 mEq/L (ref 96–112)
Creatinine, Ser: 0.76 mg/dL (ref 0.40–1.20)
GFR: 74.6 mL/min (ref >60.00)
Glucose, Bld: 98 mg/dL (ref 70–99)
Potassium: 4 mEq/L (ref 3.5–5.1)
Sodium: 140 mEq/L (ref 135–145)

## 2019-06-25 LAB — VITAMIN B12: Vitamin B-12: 630 pg/mL (ref 211–911)

## 2019-06-29 ENCOUNTER — Other Ambulatory Visit: Payer: Self-pay | Admitting: Rheumatology

## 2019-06-29 NOTE — Telephone Encounter (Signed)
Last Visit: 03/24/2019 Next Visit: 08/12/2019  Okay to refill per Dr. Estanislado Pandy.

## 2019-07-21 DIAGNOSIS — R69 Illness, unspecified: Secondary | ICD-10-CM | POA: Diagnosis not present

## 2019-07-23 ENCOUNTER — Other Ambulatory Visit: Payer: Self-pay | Admitting: Rheumatology

## 2019-07-23 NOTE — Telephone Encounter (Signed)
Last Visit: 03/24/2019 Next Visit: 08/12/2019  Okay to refill per Dr. Estanislado Pandy

## 2019-08-06 NOTE — Progress Notes (Signed)
Virtual Visit via Telephone Note  I connected with [name] on 08/07/19 at 10:00 AM EST by telephone and verified that I am speaking with the correct person using two identifiers.  Location: Patient: Home Provider: Clinic  This service was conducted via virtual visit.  The patient was located at home. I was located in my office.  Consent was obtained prior to the virtual visit and is aware of possible charges through their insurance for this visit.  The patient is an established patient.  Dr. Estanislado Pandy, MD conducted the virtual visit and Hazel Sams, PA-C acted as scribe during the service.  Office staff helped with scheduling follow up visits after the service was conducted.     I discussed the limitations, risks, security and privacy concerns of performing an evaluation and management service by telephone and the availability of in person appointments. I also discussed with the patient that there may be a patient responsible charge related to this service. The patient expressed understanding and agreed to proceed.  CC: Medication monitoring  History of Present Illness: [name] is a 74 y.o. female with a history of PMR, osteoarthritis, and DD.  Patient was diagnosed by her PCP in June 2020.  She is taking Methotrexate 3 tablets by mouth once weekly and folic acid 1 mg po daily. She is currently on prednisone 5 mg po daily.  She denies any muscle tenderness.  She has some difficulty getting up from a seated position, but she feels the weakness has been stable over the past few months.  She has intermittent pain in both hands and both feet but no joint swelling.  She has occasional discomfort in her neck.    Review of Systems  Constitutional: Positive for malaise/fatigue. Negative for fever.  HENT: Negative for congestion.   Eyes: Negative for photophobia, pain, discharge and redness.  Respiratory: Negative for cough, shortness of breath and wheezing.   Cardiovascular: Negative  for chest pain, palpitations and leg swelling.  Gastrointestinal: Negative for blood in stool, constipation and diarrhea.  Genitourinary: Negative for dysuria.  Musculoskeletal: Positive for joint pain. Negative for back pain, myalgias and neck pain.  Skin: Negative for rash.  Neurological: Negative for dizziness, weakness and headaches.  Endo/Heme/Allergies: Does not bruise/bleed easily.  Psychiatric/Behavioral: Positive for memory loss. Negative for depression. The patient is not nervous/anxious and does not have insomnia.      Observations/Objective:  Physical Exam  Constitutional: She is oriented to person, place, and time.  Neurological: She is alert and oriented to person, place, and time.  Psychiatric: Mood, memory, affect and judgment normal.     Patient reports morning stiffness for 0 NONE.   Patient denies nocturnal pain.  Difficulty dressing/grooming: Denies Difficulty climbing stairs: Denies Difficulty getting out of chair: Reports Difficulty using hands for taps, buttons, cutlery, and/or writing: Denies  Assessment and Plan: Visit Diagnoses: Polymyalgia rheumatica ([place]): Diagnosed by her PCP in June 2020: She has not had any signs or symptoms of a PMR flare recently.  She has some difficulty rising from a seated position, but she has not developed any new or worsening symptoms since starting to taper prednisone. She is currently on prednisone 5 mg po daily and will be tapering to 4 mg daily on 08/17/19.  She will continue tapering by 1 mg every month as tolerated. She discontinued MTX on 07/09/19 due to experiencing an irregular HR.  She was not evaluated by a medical professional for the irregular HR she was experiencing.  We discussed the risk of flaring while tapering prednisone and not being on MTX.  She was advised to monitor her symptoms closely. She was advised to notify us if she develops signs or symptoms of a flare.  She will follow up in 3 months.    Rheumatoid  factor positive: She has no joint swelling currently.   High risk medication use - D/c Methotrexate on 07/09/19 due to irregular HR.  She was not evaluated for the irregular HR she was experiencing.  CBC and CMP were drawn on 06/16/19.    Current chronic use of systemic steroids - She is taking prednisone 5 mg po daily. She will be tapering by 1 mg every month.   Primary osteoarthritis of both hands: She has intermittent discomfort in both hands but no joint inflammation.   Carpal tunnel syndrome, bilateral: Diagnosed by Dr. Fredna Dow.   Primary osteoarthritis of both feet: She has occasional discomfort in both feet.  No joint swelling.  DDD (degenerative disc disease), cervical: She has occasional neck pain and stiffness.  No symptoms of radiculopathy.   Injury of right rotator cuff, subsequent encounter  Other medical conditions are listed as follows:   Lacunar infarction ([place])  B12 deficiency  History of vertigo  Follow Up Instructions: She will follow up in 3 months.    I discussed the assessment and treatment plan with the patient. The patient was provided an opportunity to ask questions and all were answered. The patient agreed with the plan and demonstrated an understanding of the instructions.   The patient was advised to call back or seek an in-person evaluation if the symptoms worsen or if the condition fails to improve as anticipated.  I provided 15 minutes of non-face-to-face time during this encounter.  Bo Merino, MD   Scribed by-  Hazel Sams, PA-C

## 2019-08-07 ENCOUNTER — Telehealth (INDEPENDENT_AMBULATORY_CARE_PROVIDER_SITE_OTHER): Payer: Medicare HMO | Admitting: Rheumatology

## 2019-08-07 ENCOUNTER — Other Ambulatory Visit: Payer: Self-pay | Admitting: *Deleted

## 2019-08-07 ENCOUNTER — Other Ambulatory Visit: Payer: Self-pay

## 2019-08-07 ENCOUNTER — Encounter: Payer: Self-pay | Admitting: Rheumatology

## 2019-08-07 DIAGNOSIS — M19071 Primary osteoarthritis, right ankle and foot: Secondary | ICD-10-CM

## 2019-08-07 DIAGNOSIS — R768 Other specified abnormal immunological findings in serum: Secondary | ICD-10-CM | POA: Diagnosis not present

## 2019-08-07 DIAGNOSIS — S46001D Unspecified injury of muscle(s) and tendon(s) of the rotator cuff of right shoulder, subsequent encounter: Secondary | ICD-10-CM

## 2019-08-07 DIAGNOSIS — Z7952 Long term (current) use of systemic steroids: Secondary | ICD-10-CM | POA: Diagnosis not present

## 2019-08-07 DIAGNOSIS — M19041 Primary osteoarthritis, right hand: Secondary | ICD-10-CM

## 2019-08-07 DIAGNOSIS — M353 Polymyalgia rheumatica: Secondary | ICD-10-CM

## 2019-08-07 DIAGNOSIS — M5030 Other cervical disc degeneration, unspecified cervical region: Secondary | ICD-10-CM

## 2019-08-07 DIAGNOSIS — G5603 Carpal tunnel syndrome, bilateral upper limbs: Secondary | ICD-10-CM

## 2019-08-07 DIAGNOSIS — E538 Deficiency of other specified B group vitamins: Secondary | ICD-10-CM | POA: Diagnosis not present

## 2019-08-07 DIAGNOSIS — Z79899 Other long term (current) drug therapy: Secondary | ICD-10-CM | POA: Diagnosis not present

## 2019-08-07 DIAGNOSIS — M19072 Primary osteoarthritis, left ankle and foot: Secondary | ICD-10-CM

## 2019-08-07 DIAGNOSIS — M19042 Primary osteoarthritis, left hand: Secondary | ICD-10-CM

## 2019-08-07 DIAGNOSIS — Z87898 Personal history of other specified conditions: Secondary | ICD-10-CM

## 2019-08-07 DIAGNOSIS — I6381 Other cerebral infarction due to occlusion or stenosis of small artery: Secondary | ICD-10-CM

## 2019-08-07 MED ORDER — PREDNISONE 1 MG PO TABS: ORAL_TABLET | ORAL | 0 refills | Status: DC

## 2019-08-10 ENCOUNTER — Telehealth: Payer: Medicare HMO | Admitting: Rheumatology

## 2019-08-12 ENCOUNTER — Telehealth: Payer: Self-pay | Admitting: Rheumatology

## 2019-08-12 ENCOUNTER — Ambulatory Visit: Payer: Medicare HMO | Admitting: Rheumatology

## 2019-08-12 NOTE — Telephone Encounter (Signed)
-----   Message from Shona Needles, [place] sent at 08/07/2019 12:08 PM EST ----- Regarding: 3 MONTH F/U

## 2019-08-12 NOTE — Telephone Encounter (Signed)
I LMOM for patient to call ,and schedule a follow up appointment around 11/05/19.

## 2019-08-30 ENCOUNTER — Other Ambulatory Visit: Payer: Self-pay | Admitting: Rheumatology

## 2019-08-30 ENCOUNTER — Ambulatory Visit: Payer: Medicare HMO | Attending: Internal Medicine

## 2019-08-30 DIAGNOSIS — Z23 Encounter for immunization: Secondary | ICD-10-CM | POA: Insufficient documentation

## 2019-08-30 NOTE — Progress Notes (Signed)
   Covid-19 Vaccination Clinic  Name:  [name]    MRN: [id number] DOB: 12/03/1945  08/30/2019  [name] was observed post Covid-19 immunization for 15 minutes without incidence. She was provided with Vaccine Information Sheet and instruction to access the V-Safe system.   [name] was instructed to call 911 with any severe reactions post vaccine: Marland Kitchen Difficulty breathing  . Swelling of your face and throat  . A fast heartbeat  . A bad rash all over your body  . Dizziness and weakness    Immunizations Administered    Name Date Dose VIS Date Route   Pfizer COVID-19 Vaccine 08/30/2019  8:54 AM 0.3 mL 06/26/2019 Intramuscular   Manufacturer: [place]   Lot: [id number]   [place]: [id number]

## 2019-08-31 NOTE — Telephone Encounter (Signed)
Last Visit: 08/07/2019 telemedicine  Next Visit: 09/22/2019  Patient is tapering prednisone by 1mg  every month.   Okay to refill per Dr. Estanislado Pandy.

## 2019-09-22 ENCOUNTER — Ambulatory Visit: Payer: Medicare HMO | Attending: Internal Medicine

## 2019-09-22 DIAGNOSIS — Z23 Encounter for immunization: Secondary | ICD-10-CM

## 2019-09-22 NOTE — Progress Notes (Signed)
   Covid-19 Vaccination Clinic  Name:  [name]    MRN: [id number] DOB: 1945/09/22  09/22/2019  [name] was observed post Covid-19 immunization for 15 minutes without incident. She was provided with Vaccine Information Sheet and instruction to access the V-Safe system.   [name] was instructed to call 911 with any severe reactions post vaccine: Marland Kitchen Difficulty breathing  . Swelling of face and throat  . A fast heartbeat  . A bad rash all over body  . Dizziness and weakness   Immunizations Administered    Name Date Dose VIS Date Route   Pfizer COVID-19 Vaccine 09/22/2019  1:28 PM 0.3 mL 06/26/2019 Intramuscular   Manufacturer: [place]   Lot: [id number]   [place]: [id number]

## 2019-10-08 ENCOUNTER — Other Ambulatory Visit: Payer: Self-pay | Admitting: Rheumatology

## 2019-10-09 NOTE — Telephone Encounter (Addendum)
Last Visit: 08/07/19 Next Visit: due April 2021.   Attempted to contact the patient and left message for patient to call the office. Need to verify prednisone dose as patient has been tapering prednisone and need to schedule patient a follow up appointment.

## 2019-10-09 NOTE — Telephone Encounter (Signed)
Patient states she is on Prednisone 1 mg 2 tablet daily.   Patient advised prescription sent to the pharmacy.

## 2019-10-12 MED ORDER — PREDNISONE 1 MG PO TABS: 2.0000 mg | ORAL_TABLET | Freq: Every day | ORAL | 0 refills | Status: DC

## 2019-10-12 NOTE — Addendum Note (Signed)
Addended by: Carole Binning on: 10/12/2019 04:22 PM   Modules accepted: Orders

## 2019-11-06 ENCOUNTER — Telehealth: Payer: Self-pay | Admitting: Rheumatology

## 2019-11-06 ENCOUNTER — Other Ambulatory Visit: Payer: Self-pay | Admitting: Rheumatology

## 2019-11-06 NOTE — Telephone Encounter (Addendum)
Last Visit: 08/07/2019 telemedicine  Next Visit: message sent to the front desk to schedule.   Patient is tapering by 1mg  each month and will be tapering to prednisone 1mg  po qd.   Okay to refill per Dr. Estanislado Pandy.

## 2019-11-06 NOTE — Telephone Encounter (Signed)
Prednisone refill sent to the pharmacy. Patient also reported the dates of her COVID vaccine.  1st dose 08/30/2019 and 2nd dose 09/22/2019.

## 2019-11-06 NOTE — Telephone Encounter (Signed)
Patient left a voicemail stating she was returning your call regarding her medication.   
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
## 2019-11-11 ENCOUNTER — Other Ambulatory Visit: Payer: Self-pay

## 2019-11-11 ENCOUNTER — Encounter: Payer: Self-pay | Admitting: Rheumatology

## 2019-11-11 ENCOUNTER — Ambulatory Visit: Payer: Medicare HMO | Admitting: Rheumatology

## 2019-11-11 VITALS — BP 172/81 | HR 78 | Resp 16 | Ht 62.0 in | Wt 148.6 lb

## 2019-11-11 DIAGNOSIS — Z7952 Long term (current) use of systemic steroids: Secondary | ICD-10-CM

## 2019-11-11 DIAGNOSIS — S46001D Unspecified injury of muscle(s) and tendon(s) of the rotator cuff of right shoulder, subsequent encounter: Secondary | ICD-10-CM

## 2019-11-11 DIAGNOSIS — E538 Deficiency of other specified B group vitamins: Secondary | ICD-10-CM

## 2019-11-11 DIAGNOSIS — M19071 Primary osteoarthritis, right ankle and foot: Secondary | ICD-10-CM

## 2019-11-11 DIAGNOSIS — M19042 Primary osteoarthritis, left hand: Secondary | ICD-10-CM

## 2019-11-11 DIAGNOSIS — G5603 Carpal tunnel syndrome, bilateral upper limbs: Secondary | ICD-10-CM | POA: Diagnosis not present

## 2019-11-11 DIAGNOSIS — M353 Polymyalgia rheumatica: Secondary | ICD-10-CM

## 2019-11-11 DIAGNOSIS — R768 Other specified abnormal immunological findings in serum: Secondary | ICD-10-CM

## 2019-11-11 DIAGNOSIS — M19072 Primary osteoarthritis, left ankle and foot: Secondary | ICD-10-CM

## 2019-11-11 DIAGNOSIS — Z79899 Other long term (current) drug therapy: Secondary | ICD-10-CM

## 2019-11-11 DIAGNOSIS — M5030 Other cervical disc degeneration, unspecified cervical region: Secondary | ICD-10-CM | POA: Diagnosis not present

## 2019-11-11 DIAGNOSIS — I6381 Other cerebral infarction due to occlusion or stenosis of small artery: Secondary | ICD-10-CM

## 2019-11-11 DIAGNOSIS — M19041 Primary osteoarthritis, right hand: Secondary | ICD-10-CM | POA: Diagnosis not present

## 2019-11-11 DIAGNOSIS — Z87898 Personal history of other specified conditions: Secondary | ICD-10-CM

## 2019-11-11 NOTE — Patient Instructions (Signed)

## 2019-11-12 LAB — SEDIMENTATION RATE: Sed Rate: 2 mm/h (ref 0–30)

## 2019-11-12 NOTE — Progress Notes (Signed)
ESR is WNL.

## 2019-12-15 ENCOUNTER — Telehealth: Payer: Self-pay | Admitting: Family Medicine

## 2019-12-15 NOTE — Telephone Encounter (Signed)
Pt is calling in stating that she is out of the medication and would like to see if it can be called in today her enalapril 20 MG once a day so that the insurance will pay for per the pharmacy.   Pharm:  [facility] on [address] and [address]

## 2019-12-15 NOTE — Telephone Encounter (Signed)
Pt's husband, Deidre Ala, is requesting to speak to Dr. Sarajane Jews. Pt is out of Enalapril 20 mg tablet.  Per Deidre Ala, pt has been taken 1 full tablet every day and it seems like she should of only been taken 1/2 tablet a day. Per Deidre Ala, insurance is not going to cover because medication is for a 90 day supply and pt was never told she needed to take 1/2 tablet a day. Thanks

## 2019-12-17 DIAGNOSIS — H524 Presbyopia: Secondary | ICD-10-CM | POA: Diagnosis not present

## 2019-12-17 DIAGNOSIS — H47013 Ischemic optic neuropathy, bilateral: Secondary | ICD-10-CM | POA: Diagnosis not present

## 2019-12-17 DIAGNOSIS — H40013 Open angle with borderline findings, low risk, bilateral: Secondary | ICD-10-CM | POA: Diagnosis not present

## 2019-12-17 DIAGNOSIS — H52223 Regular astigmatism, bilateral: Secondary | ICD-10-CM | POA: Diagnosis not present

## 2019-12-17 DIAGNOSIS — H40053 Ocular hypertension, bilateral: Secondary | ICD-10-CM | POA: Diagnosis not present

## 2019-12-17 DIAGNOSIS — H5340 Unspecified visual field defects: Secondary | ICD-10-CM | POA: Diagnosis not present

## 2019-12-17 DIAGNOSIS — H40011 Open angle with borderline findings, low risk, right eye: Secondary | ICD-10-CM | POA: Diagnosis not present

## 2019-12-17 DIAGNOSIS — H5213 Myopia, bilateral: Secondary | ICD-10-CM | POA: Diagnosis not present

## 2019-12-17 DIAGNOSIS — H47012 Ischemic optic neuropathy, left eye: Secondary | ICD-10-CM | POA: Diagnosis not present

## 2019-12-17 DIAGNOSIS — H34821 Venous engorgement, right eye: Secondary | ICD-10-CM | POA: Diagnosis not present

## 2019-12-28 ENCOUNTER — Other Ambulatory Visit: Payer: Self-pay | Admitting: Family Medicine
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
## 2020-02-05 ENCOUNTER — Telehealth: Payer: Self-pay | Admitting: Rheumatology

## 2020-02-05 NOTE — Telephone Encounter (Signed)
Patient called stating at her last appointment Dr. Estanislado Pandy discussed a program at [facility] for patients with Polymyalgia Rheumatica.  Patient is requesting a return call with the name and who she can call to get started in the program.  Patient also states that she is scheduled to see Dr. Estanislado Pandy on 02/10/20 but doesn't know if she needs to keep that appointment since she is managing her symptoms on her own.

## 2020-02-08 ENCOUNTER — Telehealth: Payer: Self-pay | Admitting: Rheumatology

## 2020-02-08 NOTE — Telephone Encounter (Signed)
Patient advised Dr. Estanislado Pandy does not remember which [facility] program she mentioned about PMR.  Patient advised advised Dr. Estanislado Pandy is not aware of any PMR program at [facility].   Patient states she had asked at her last appointment about a different approach to treatment for PMR. Patient states she is concerned about the idea of starting steroids again and coming back off of them. Patient states "how is that doing me any good?" She had a follow up appointment on 02/10/2020 and she has decided to cancel that. Patient states she will talk to Dr. Sarajane Jews about it.

## 2020-02-08 NOTE — Telephone Encounter (Signed)
I do not remember which [facility] program I mentioned about PMR.  I am not aware of any PMR program at [facility].

## 2020-02-08 NOTE — Telephone Encounter (Signed)
See previous phone note.  

## 2020-02-08 NOTE — Telephone Encounter (Signed)
Attempted to contact the patient and left message for patient to call the office.  

## 2020-02-08 NOTE — Telephone Encounter (Signed)
Patient called stating she was returning your call.   °

## 2020-02-09 NOTE — Telephone Encounter (Signed)
Attempted to contact the patient and left message for patient to call the office.  

## 2020-02-09 NOTE — Telephone Encounter (Signed)
Patient returned call to the office. Patient advised Dr. Estanislado Pandy believes  she was interested in [facility] in [place]. Patient given contact information for [place].

## 2020-02-09 NOTE — Telephone Encounter (Signed)
I think she was interested in [facility] in [place].

## 2020-02-10 ENCOUNTER — Ambulatory Visit: Payer: Medicare HMO | Admitting: Rheumatology

## 2020-03-02 ENCOUNTER — Ambulatory Visit (INDEPENDENT_AMBULATORY_CARE_PROVIDER_SITE_OTHER): Payer: Medicare HMO | Admitting: Family Medicine

## 2020-03-02 ENCOUNTER — Encounter: Payer: Self-pay | Admitting: Family Medicine

## 2020-03-02 ENCOUNTER — Other Ambulatory Visit: Payer: Self-pay

## 2020-03-02 VITALS — BP 160/90 | HR 108 | Temp 97.7°F | Wt 132.8 lb

## 2020-03-02 DIAGNOSIS — I10 Essential (primary) hypertension: Secondary | ICD-10-CM | POA: Diagnosis not present

## 2020-03-02 DIAGNOSIS — R413 Other amnesia: Secondary | ICD-10-CM

## 2020-03-02 DIAGNOSIS — E538 Deficiency of other specified B group vitamins: Secondary | ICD-10-CM

## 2020-03-02 DIAGNOSIS — R410 Disorientation, unspecified: Secondary | ICD-10-CM

## 2020-03-02 DIAGNOSIS — R42 Dizziness and giddiness: Secondary | ICD-10-CM

## 2020-03-02 MED ORDER — ENALAPRIL MALEATE 20 MG PO TABS: 10.0000 mg | ORAL_TABLET | Freq: Every day | ORAL | 3 refills | Status: DC

## 2020-03-02 MED ORDER — HYDROCHLOROTHIAZIDE 25 MG PO TABS: 25.0000 mg | ORAL_TABLET | Freq: Every day | ORAL | 3 refills | Status: DC
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
## 2020-03-03 ENCOUNTER — Telehealth: Payer: Self-pay | Admitting: *Deleted

## 2020-03-03 ENCOUNTER — Inpatient Hospital Stay (HOSPITAL_COMMUNITY)
Admission: EM | Admit: 2020-03-03 | Discharge: 2020-03-06 | DRG: 640 | Disposition: A | Discharge status: Home / Self Care | Payer: Medicare HMO | Attending: Internal Medicine | Admitting: Internal Medicine

## 2020-03-03 DIAGNOSIS — E860 Dehydration: Secondary | ICD-10-CM | POA: Diagnosis present

## 2020-03-03 DIAGNOSIS — I129 Hypertensive chronic kidney disease with stage 1 through stage 4 chronic kidney disease, or unspecified chronic kidney disease: Secondary | ICD-10-CM | POA: Diagnosis present

## 2020-03-03 DIAGNOSIS — K5900 Constipation, unspecified: Secondary | ICD-10-CM | POA: Diagnosis present

## 2020-03-03 DIAGNOSIS — E538 Deficiency of other specified B group vitamins: Secondary | ICD-10-CM | POA: Diagnosis present

## 2020-03-03 DIAGNOSIS — Z79899 Other long term (current) drug therapy: Secondary | ICD-10-CM

## 2020-03-03 DIAGNOSIS — E876 Hypokalemia: Secondary | ICD-10-CM | POA: Diagnosis present

## 2020-03-03 DIAGNOSIS — N179 Acute kidney failure, unspecified: Secondary | ICD-10-CM | POA: Diagnosis not present

## 2020-03-03 DIAGNOSIS — J359 Chronic disease of tonsils and adenoids, unspecified: Secondary | ICD-10-CM | POA: Diagnosis present

## 2020-03-03 DIAGNOSIS — G9341 Metabolic encephalopathy: Secondary | ICD-10-CM | POA: Diagnosis present

## 2020-03-03 DIAGNOSIS — Z833 Family history of diabetes mellitus: Secondary | ICD-10-CM

## 2020-03-03 DIAGNOSIS — I10 Essential (primary) hypertension: Secondary | ICD-10-CM | POA: Diagnosis present

## 2020-03-03 DIAGNOSIS — Z83438 Family history of other disorder of lipoprotein metabolism and other lipidemia: Secondary | ICD-10-CM

## 2020-03-03 DIAGNOSIS — Z880 Allergy status to penicillin: Secondary | ICD-10-CM

## 2020-03-03 DIAGNOSIS — E569 Vitamin deficiency, unspecified: Secondary | ICD-10-CM | POA: Diagnosis present

## 2020-03-03 DIAGNOSIS — S0990XA Unspecified injury of head, initial encounter: Secondary | ICD-10-CM | POA: Diagnosis present

## 2020-03-03 DIAGNOSIS — W19XXXA Unspecified fall, initial encounter: Secondary | ICD-10-CM | POA: Diagnosis present

## 2020-03-03 DIAGNOSIS — Z8673 Personal history of transient ischemic attack (TIA), and cerebral infarction without residual deficits: Secondary | ICD-10-CM

## 2020-03-03 DIAGNOSIS — Z886 Allergy status to analgesic agent status: Secondary | ICD-10-CM

## 2020-03-03 DIAGNOSIS — Z8249 Family history of ischemic heart disease and other diseases of the circulatory system: Secondary | ICD-10-CM

## 2020-03-03 DIAGNOSIS — Z20822 Contact with and (suspected) exposure to covid-19: Secondary | ICD-10-CM | POA: Diagnosis present

## 2020-03-03 DIAGNOSIS — R296 Repeated falls: Secondary | ICD-10-CM | POA: Diagnosis present

## 2020-03-03 DIAGNOSIS — N182 Chronic kidney disease, stage 2 (mild): Secondary | ICD-10-CM

## 2020-03-03 DIAGNOSIS — G5603 Carpal tunnel syndrome, bilateral upper limbs: Secondary | ICD-10-CM | POA: Diagnosis present

## 2020-03-03 DIAGNOSIS — M353 Polymyalgia rheumatica: Secondary | ICD-10-CM

## 2020-03-03 LAB — HEPATIC FUNCTION PANEL
AG Ratio: 1.2 (calc) (ref 1.0–2.5)
ALT: 11 U/L (ref 6–29)
AST: 28 U/L (ref 10–35)
Albumin: 3.9 g/dL (ref 3.6–5.1)
Alkaline phosphatase (APISO): 54 U/L (ref 37–153)
Bilirubin, Direct: 0.2 mg/dL (ref 0.0–0.2)
Globulin: 3.2 g/dL (calc) (ref 1.9–3.7)
Indirect Bilirubin: 0.7 mg/dL (calc) (ref 0.2–1.2)
Total Bilirubin: 0.9 mg/dL (ref 0.2–1.2)
Total Protein: 7.1 g/dL (ref 6.1–8.1)

## 2020-03-03 LAB — CBC WITH DIFFERENTIAL/PLATELET
Absolute Monocytes: 846 cells/uL (ref 200–950)
Basophils Absolute: 62 cells/uL (ref 0–200)
Basophils Relative: 0.7 %
Eosinophils Absolute: 18 cells/uL (ref 15–500)
Eosinophils Relative: 0.2 %
HCT: 42.6 % (ref 35.0–45.0)
Hemoglobin: 14.1 g/dL (ref 11.7–15.5)
Lymphs Abs: 2554 cells/uL (ref 850–3900)
MCH: 28.4 pg (ref 27.0–33.0)
MCHC: 33.1 g/dL (ref 32.0–36.0)
MCV: 85.9 fL (ref 80.0–100.0)
MPV: 11.2 fL (ref 7.5–12.5)
Monocytes Relative: 9.5 %
Neutro Abs: 5420 cells/uL (ref 1500–7800)
Neutrophils Relative %: 60.9 %
Platelets: 246 10*3/uL (ref 140–400)
RBC: 4.96 10*6/uL (ref 3.80–5.10)
RDW: 13.4 % (ref 11.0–15.0)
Total Lymphocyte: 28.7 %
WBC: 8.9 10*3/uL (ref 3.8–10.8)

## 2020-03-03 LAB — CBC
HCT: 41 % (ref 36.0–46.0)
Hemoglobin: 13.2 g/dL (ref 12.0–15.0)
MCH: 28.8 pg (ref 26.0–34.0)
MCHC: 32.2 g/dL (ref 30.0–36.0)
MCV: 89.3 fL (ref 80.0–100.0)
Platelets: 212 10*3/uL (ref 150–400)
RBC: 4.59 MIL/uL (ref 3.87–5.11)
RDW: 14.3 % (ref 11.5–15.5)
WBC: 8.7 10*3/uL (ref 4.0–10.5)
nRBC: 0 % (ref 0.0–0.2)

## 2020-03-03 LAB — BASIC METABOLIC PANEL
BUN/Creatinine Ratio: 21 (calc) (ref 6–22)
BUN: 31 mg/dL — ABNORMAL HIGH (ref 7–25)
CO2: 28 mmol/L (ref 20–32)
Calcium: 14.3 mg/dL (ref 8.6–10.4)
Chloride: 98 mmol/L (ref 98–110)
Creat: 1.48 mg/dL — ABNORMAL HIGH (ref 0.60–0.93)
Glucose, Bld: 131 mg/dL — ABNORMAL HIGH (ref 65–99)
Potassium: 3.9 mmol/L (ref 3.5–5.3)
Sodium: 138 mmol/L (ref 135–146)

## 2020-03-03 LAB — COMPREHENSIVE METABOLIC PANEL
ALT: 13 U/L (ref 0–44)
AST: 29 U/L (ref 15–41)
Albumin: 3.6 g/dL (ref 3.5–5.0)
Alkaline Phosphatase: 47 U/L (ref 38–126)
Anion gap: 13 (ref 5–15)
BUN: 29 mg/dL — ABNORMAL HIGH (ref 8–23)
CO2: 24 mmol/L (ref 22–32)
Calcium: 12.8 mg/dL — ABNORMAL HIGH (ref 8.9–10.3)
Chloride: 100 mmol/L (ref 98–111)
Creatinine, Ser: 1.55 mg/dL — ABNORMAL HIGH (ref 0.44–1.00)
GFR calc Af Amer: 38 mL/min — ABNORMAL LOW (ref >60)
GFR calc non Af Amer: 33 mL/min — ABNORMAL LOW (ref >60)
Glucose, Bld: 92 mg/dL (ref 70–99)
Potassium: 3.2 mmol/L — ABNORMAL LOW (ref 3.5–5.1)
Sodium: 137 mmol/L (ref 135–145)
Total Bilirubin: 1 mg/dL (ref 0.3–1.2)
Total Protein: 6.9 g/dL (ref 6.5–8.1)

## 2020-03-03 LAB — TSH: TSH: 2.57 mIU/L (ref 0.40–4.50)

## 2020-03-03 LAB — VITAMIN B12: Vitamin B-12: 678 pg/mL (ref 200–1100)

## 2020-03-03 NOTE — Telephone Encounter (Signed)
[name]  called the after hours line about critical results. Patient calcium level is 14.3 (ref range is 8.6-10.4). They spoke with on call MD Olivia Mackie McClean-Scocuzza, MD) and requested triage contact patient and advise patient to go to ED. Triage Attempted x 3 to reach her and then left detailed message for patient to monitor for confusion, increased thirst, increased urination, constipation, muscle weakness. Asked patien to call back to office or go on to ED.   Clinic RN does not see where patient went to ED. Please advise

## 2020-03-03 NOTE — Telephone Encounter (Signed)
Patient called back. Clinic RN informed her of the test results and ask if she was having sx. Patient reports yes increased urination, nausea, confusion, etc. Consulted with Dr. Sarajane Jews and he agreed she needs to go to ED ASAP. Communicated this information with patient and she agreed to go to ED.

## 2020-03-03 NOTE — ED Triage Notes (Signed)
Pt went to PCP yesterday related to a fall, had blood work drawn, and she received call today that her calcium is elevated (14.3).

## 2020-03-04 ENCOUNTER — Emergency Department (HOSPITAL_COMMUNITY): Payer: Medicare HMO

## 2020-03-04 ENCOUNTER — Encounter (HOSPITAL_COMMUNITY): Payer: Self-pay | Admitting: Internal Medicine

## 2020-03-04 ENCOUNTER — Observation Stay (HOSPITAL_COMMUNITY): Payer: Medicare HMO

## 2020-03-04 DIAGNOSIS — R296 Repeated falls: Secondary | ICD-10-CM | POA: Diagnosis present

## 2020-03-04 DIAGNOSIS — R531 Weakness: Secondary | ICD-10-CM | POA: Diagnosis not present

## 2020-03-04 DIAGNOSIS — E569 Vitamin deficiency, unspecified: Secondary | ICD-10-CM | POA: Diagnosis not present

## 2020-03-04 DIAGNOSIS — K5900 Constipation, unspecified: Secondary | ICD-10-CM | POA: Diagnosis present

## 2020-03-04 DIAGNOSIS — E860 Dehydration: Secondary | ICD-10-CM | POA: Diagnosis present

## 2020-03-04 DIAGNOSIS — I10 Essential (primary) hypertension: Secondary | ICD-10-CM

## 2020-03-04 DIAGNOSIS — E876 Hypokalemia: Secondary | ICD-10-CM

## 2020-03-04 DIAGNOSIS — N182 Chronic kidney disease, stage 2 (mild): Secondary | ICD-10-CM | POA: Diagnosis not present

## 2020-03-04 DIAGNOSIS — N179 Acute kidney failure, unspecified: Secondary | ICD-10-CM

## 2020-03-04 DIAGNOSIS — E538 Deficiency of other specified B group vitamins: Secondary | ICD-10-CM | POA: Diagnosis present

## 2020-03-04 DIAGNOSIS — Z20822 Contact with and (suspected) exposure to covid-19: Secondary | ICD-10-CM | POA: Diagnosis not present

## 2020-03-04 DIAGNOSIS — Z886 Allergy status to analgesic agent status: Secondary | ICD-10-CM | POA: Diagnosis not present

## 2020-03-04 DIAGNOSIS — G5603 Carpal tunnel syndrome, bilateral upper limbs: Secondary | ICD-10-CM | POA: Diagnosis present

## 2020-03-04 DIAGNOSIS — M353 Polymyalgia rheumatica: Secondary | ICD-10-CM | POA: Diagnosis not present

## 2020-03-04 DIAGNOSIS — Z833 Family history of diabetes mellitus: Secondary | ICD-10-CM | POA: Diagnosis not present

## 2020-03-04 DIAGNOSIS — G9341 Metabolic encephalopathy: Secondary | ICD-10-CM | POA: Diagnosis not present

## 2020-03-04 DIAGNOSIS — S0990XA Unspecified injury of head, initial encounter: Secondary | ICD-10-CM | POA: Diagnosis not present

## 2020-03-04 DIAGNOSIS — Z83438 Family history of other disorder of lipoprotein metabolism and other lipidemia: Secondary | ICD-10-CM | POA: Diagnosis not present

## 2020-03-04 DIAGNOSIS — W19XXXA Unspecified fall, initial encounter: Secondary | ICD-10-CM

## 2020-03-04 DIAGNOSIS — Z79899 Other long term (current) drug therapy: Secondary | ICD-10-CM | POA: Diagnosis not present

## 2020-03-04 DIAGNOSIS — Z8673 Personal history of transient ischemic attack (TIA), and cerebral infarction without residual deficits: Secondary | ICD-10-CM | POA: Diagnosis not present

## 2020-03-04 DIAGNOSIS — Z8249 Family history of ischemic heart disease and other diseases of the circulatory system: Secondary | ICD-10-CM | POA: Diagnosis not present

## 2020-03-04 DIAGNOSIS — J359 Chronic disease of tonsils and adenoids, unspecified: Secondary | ICD-10-CM | POA: Diagnosis present

## 2020-03-04 DIAGNOSIS — I129 Hypertensive chronic kidney disease with stage 1 through stage 4 chronic kidney disease, or unspecified chronic kidney disease: Secondary | ICD-10-CM | POA: Diagnosis not present

## 2020-03-04 DIAGNOSIS — Z880 Allergy status to penicillin: Secondary | ICD-10-CM | POA: Diagnosis not present

## 2020-03-04 LAB — CREATININE, SERUM
Creatinine, Ser: 1.32 mg/dL — ABNORMAL HIGH (ref 0.44–1.00)
GFR calc Af Amer: 46 mL/min — ABNORMAL LOW (ref >60)
GFR calc non Af Amer: 40 mL/min — ABNORMAL LOW (ref >60)

## 2020-03-04 LAB — CBC
HCT: 41.3 % (ref 36.0–46.0)
Hemoglobin: 13.2 g/dL (ref 12.0–15.0)
MCH: 28.1 pg (ref 26.0–34.0)
MCHC: 32 g/dL (ref 30.0–36.0)
MCV: 88.1 fL (ref 80.0–100.0)
Platelets: 191 10*3/uL (ref 150–400)
RBC: 4.69 MIL/uL (ref 3.87–5.11)
RDW: 14.4 % (ref 11.5–15.5)
WBC: 6.7 10*3/uL (ref 4.0–10.5)
nRBC: 0 % (ref 0.0–0.2)

## 2020-03-04 LAB — SEDIMENTATION RATE: Sed Rate: 7 mm/hr (ref 0–22)

## 2020-03-04 LAB — AMMONIA: Ammonia: 25 umol/L (ref 9–35)

## 2020-03-04 LAB — BASIC METABOLIC PANEL
Anion gap: 6 (ref 5–15)
BUN: 24 mg/dL — ABNORMAL HIGH (ref 8–23)
CO2: 23 mmol/L (ref 22–32)
Calcium: 11.3 mg/dL — ABNORMAL HIGH (ref 8.9–10.3)
Chloride: 106 mmol/L (ref 98–111)
Creatinine, Ser: 1.18 mg/dL — ABNORMAL HIGH (ref 0.44–1.00)
GFR calc Af Amer: 53 mL/min — ABNORMAL LOW (ref >60)
GFR calc non Af Amer: 46 mL/min — ABNORMAL LOW (ref >60)
Glucose, Bld: 81 mg/dL (ref 70–99)
Potassium: 3.6 mmol/L (ref 3.5–5.1)
Sodium: 135 mmol/L (ref 135–145)

## 2020-03-04 LAB — CK: Total CK: 64 U/L (ref 38–234)

## 2020-03-04 LAB — VITAMIN D 25 HYDROXY (VIT D DEFICIENCY, FRACTURES): Vit D, 25-Hydroxy: 67.84 ng/mL (ref 30–100)

## 2020-03-04 LAB — SARS CORONAVIRUS 2 BY RT PCR (HOSPITAL ORDER, PERFORMED IN ~~LOC~~ HOSPITAL LAB): SARS Coronavirus 2: NEGATIVE

## 2020-03-04 LAB — PHOSPHORUS: Phosphorus: 2.6 mg/dL (ref 2.5–4.6)

## 2020-03-04 LAB — TSH: TSH: 1.755 u[IU]/mL (ref 0.350–4.500)

## 2020-03-04 LAB — CORTISOL: Cortisol, Plasma: 9.6 ug/dL

## 2020-03-04 MED ORDER — ONDANSETRON HCL 4 MG/2ML IJ SOLN: 4.0000 mg | Freq: Four times a day (QID) | INTRAMUSCULAR | Status: DC | PRN

## 2020-03-04 MED ORDER — ONDANSETRON HCL 4 MG PO TABS: 4.0000 mg | ORAL_TABLET | Freq: Four times a day (QID) | ORAL | Status: DC | PRN

## 2020-03-04 MED ORDER — LORAZEPAM 2 MG/ML IJ SOLN
0.2500 mg | Freq: Once | INTRAMUSCULAR | Status: AC
  Administered 2020-03-04: 0.25 mg via INTRAVENOUS
  Filled 2020-03-04: qty 1

## 2020-03-04 MED ORDER — ENALAPRIL MALEATE 5 MG PO TABS: 10.0000 mg | ORAL_TABLET | Freq: Every day | ORAL | Status: DC

## 2020-03-04 MED ORDER — ENOXAPARIN SODIUM 30 MG/0.3ML ~~LOC~~ SOLN
30.0000 mg | SUBCUTANEOUS | Status: DC
  Administered 2020-03-04 – 2020-03-06 (×3): 30 mg via SUBCUTANEOUS
  Filled 2020-03-04 (×3): qty 0.3

## 2020-03-04 MED ORDER — POTASSIUM CHLORIDE 10 MEQ/100ML IV SOLN
10.0000 meq | INTRAVENOUS | Status: AC
  Administered 2020-03-04 (×2): 10 meq via INTRAVENOUS
  Filled 2020-03-04 (×2): qty 100

## 2020-03-04 MED ORDER — SODIUM CHLORIDE 0.9 % IV BOLUS (SEPSIS)
1000.0000 mL | Freq: Once | INTRAVENOUS | Status: AC
  Administered 2020-03-04: 1000 mL via INTRAVENOUS

## 2020-03-04 MED ORDER — SODIUM CHLORIDE 0.9 % IV SOLN
1000.0000 mL | INTRAVENOUS | Status: DC
  Administered 2020-03-04 (×2): 1000 mL via INTRAVENOUS

## 2020-03-04 MED ORDER — HYDRALAZINE HCL 20 MG/ML IJ SOLN
10.0000 mg | INTRAMUSCULAR | Status: DC | PRN
  Administered 2020-03-04 – 2020-03-05 (×3): 10 mg via INTRAVENOUS
  Filled 2020-03-04 (×3): qty 1

## 2020-03-04 NOTE — Progress Notes (Signed)
PROGRESS NOTE  [name] CXK:[id number] DOB: Oct 06, 1945 DOA: 03/03/2020 PCP: Laurey Morale, MD   LOS: 0 days   Brief narrative: As per HPI,  [name] is a 74 y.o. female with history of hypertension, polymyalgia rheumatica was advised to come to the ER after patient had blood work done with her PCP and was found to have markedly elevated calcium of 14.3.  Patient recently has been having increasing confusion weakness and falls.  Had gone to her primary care physician 2 days ago had blood work drawn and was found to have hypercalcemia.  Patient states that last 2 months ago patient was on steroids for polymyalgia rheumatica which was weaned off.  Over the last 1 month patient has been taking vitamin D3 over-the-counter.   In the ER, patient is alert awake oriented but appears confused.  CT head unremarkable.  Repeat calcium done  was around 12.8 with creatinine 1.5.  Creatinine was normal last December.  EKG shows normal sinus rhythm with QTC of 432 ms QRS of 96 ms.  Patient was given normal saline bolus and started on normal saline infusion and was admitted for symptomatic hypercalcemia.   Assessment/Plan:  Principal Problem:   Hypercalcemia Active Problems:   Essential hypertension  Symptomatic hypercalcemia -with weakness falls, confusion and dehydration.  Continue IV fluid hydration at this time.  Closely monitor calcium levels.  Hold vitamin D3 supplementation, hydrochlorothiazide.   vitamin D at 51.  TSH 1.7.  Cortisol 9.6.  Pending intact PTH and PTH related protein.  Generalized falls, generalized weakness, head trauma patient is still in the ED.  Will need PT evaluation.  MRI of the brain did not show any acute findings.  CT head scan was negative for acute findings.  Acute kidney injury could be from poor oral intake hypercalcemia..    Continue IV fluid hydration. Hold hydrochlorothiazide and ACE inhibitor.  Essential hypertension we will keep patient on as needed  IV hydralazine for now.  Was on hydrochlorothiazide.  Discontinue.  Could consider ACE inhibitor as renal function improves.  Acute metabolic encephalopathy with confusion could be from hypercalcemia.    Ammonia of 25.  CK of 62.  History of polymyalgia rheumatica recently weaned off prednisone.    Sed rate of 7. DVT prophylaxis: enoxaparin (LOVENOX) injection 30 mg Start: 03/04/20 0800   Mild hypokalemia.  Improved with replacement.  Check BMP in a.m.   Code Status: Full code  Family Communication: None  Status is: Inpatient  Remains inpatient appropriate because:Unsafe d/c plan and IV treatments appropriate due to intensity of illness or inability to take PO, PT evaluation   Dispo: The patient is from: Home              Anticipated d/c is to: Home              Anticipated d/c date is: 1 day              Patient currently is not medically stable to d/c.    Consultants:  None  Procedures:  None  Antibiotics:  . None  Anti-infectives (From admission, onward)   None     Subjective: Today, patient was seen and examined at bedside.  Complains of generalized weakness.  No nausea vomiting.  Complains of fall with hitting her head.  Objective: Vitals:   03/04/20 0600 03/04/20 1015  BP: (!) 160/93 (!) 155/69  Pulse: 66 (!) 59  Resp: 12 16  Temp:    SpO2:  96% 99%    Intake/Output Summary (Last 24 hours) at 03/04/2020 1611 Last data filed at 03/04/2020 1022 Gross per 24 hour  Intake --  Output 700 ml  Net -700 ml   Filed Weights   03/04/20 0354  Weight: 59.9 kg   Body mass index is 24.14 kg/m.   Physical Exam: GENERAL: Patient is alert awake and communicative.  Oriented to time and place.  Not in obvious distress. HENT: No scleral pallor or icterus. Pupils equally reactive to light. Oral mucosa is moist NECK: is supple, no gross swelling noted. CHEST: Clear to auscultation. No crackles or wheezes.  Diminished breath sounds bilaterally. CVS: S1 and S2  heard, no murmur. Regular rate and rhythm.  ABDOMEN: Soft, non-tender, bowel sounds are present. EXTREMITIES: No edema. CNS: Cranial nerves are intact.  Generalized weakness noted.  Moving all extremities. SKIN: warm and dry without rashes.  Data Review: I have personally reviewed the following laboratory data and studies,  CBC: Recent Labs  Lab 03/02/20 1721 03/03/20 1303 03/04/20 0353  WBC 8.9 8.7 6.7  NEUTROABS 5,420  --   --   HGB 14.1 13.2 13.2  HCT 42.6 41.0 41.3  MCV 85.9 89.3 88.1  PLT 246 212 644   Basic Metabolic Panel: Recent Labs  Lab 03/02/20 1721 03/03/20 1303 03/04/20 0353 03/04/20 0629  NA 138 137  --  135  K 3.9 3.2*  --  3.6  CL 98 100  --  106  CO2 28 24  --  23  GLUCOSE 131* 92  --  81  BUN 31* 29*  --  24*  CREATININE 1.48* 1.55* 1.32* 1.18*  CALCIUM 14.3* 12.8*  --  11.3*  PHOS  --   --  2.6  --    Liver Function Tests: Recent Labs  Lab 03/02/20 1721 03/03/20 1303  AST 28 29  ALT 11 13  ALKPHOS  --  47  BILITOT 0.9 1.0  PROT 7.1 6.9  ALBUMIN  --  3.6   No results for input(s): LIPASE, AMYLASE in the last 168 hours. Recent Labs  Lab 03/04/20 0629  AMMONIA 25   Cardiac Enzymes: Recent Labs  Lab 03/04/20 0629  CKTOTAL 64   BNP (last 3 results) No results for input(s): BNP in the last 8760 hours.  ProBNP (last 3 results) No results for input(s): PROBNP in the last 8760 hours.  CBG: No results for input(s): GLUCAP in the last 168 hours. Recent Results (from the past 240 hour(s))  SARS Coronavirus 2 by RT PCR (hospital order, performed in [facility] hospital lab) Nasopharyngeal Nasopharyngeal Swab     Status: None   Collection Time: 03/04/20  1:49 AM   Specimen: Nasopharyngeal Swab  Result Value Ref Range Status   SARS Coronavirus 2 NEGATIVE NEGATIVE Final    Comment: (NOTE) SARS-CoV-2 target nucleic acids are NOT DETECTED.  The SARS-CoV-2 RNA is generally detectable in upper and lower respiratory specimens during the  acute phase of infection. The lowest concentration of SARS-CoV-2 viral copies this assay can detect is 250 copies / mL. A negative result does not preclude SARS-CoV-2 infection and should not be used as the sole basis for treatment or other patient management decisions.  A negative result may occur with improper specimen collection / handling, submission of specimen other than nasopharyngeal swab, presence of viral mutation(s) within the areas targeted by this assay, and inadequate number of viral copies (<250 copies / mL). A negative result must be combined with clinical  observations, patient history, and epidemiological information.  Fact Sheet for Patients:   StrictlyIdeas.no  Fact Sheet for Healthcare Providers: BankingDealers.co.za  This test is not yet approved or  cleared by the [place] FDA and has been authorized for detection and/or diagnosis of SARS-CoV-2 by FDA under an Emergency Use Authorization (EUA).  This EUA will remain in effect (meaning this test can be used) for the duration of the COVID-19 declaration under Section 564(b)(1) of the Act, 21 U.S.C. section 360bbb-3(b)(1), unless the authorization is terminated or revoked sooner.  Performed at [facility], [place] [address], [place], [place] [address]      Studies: CT Head Wo Contrast  Result Date: 03/04/2020 CLINICAL DATA:  Fall.  Confusion.  Fatigue.  Nausea. EXAM: CT HEAD WITHOUT CONTRAST TECHNIQUE: Contiguous axial images were obtained from the base of the skull through the vertex without intravenous contrast. COMPARISON:  Head CT and brain MRI 10/30/2018 FINDINGS: Brain: Stable degree of chronic small vessel ischemia from prior exam. Brain volume is normal for age. No intracranial hemorrhage, mass effect, or midline shift. No hydrocephalus. The basilar cisterns are patent. No evidence of territorial infarct or acute ischemia. No extra-axial or intracranial  fluid collection. Vascular: Atherosclerosis of skullbase vasculature without hyperdense vessel or abnormal calcification. Skull: No fracture or focal lesion. Sinuses/Orbits: Paranasal sinuses and mastoid air cells are clear. The visualized orbits are unremarkable. Other: None. IMPRESSION: 1. No acute intracranial abnormality. No skull fracture. 2. Stable degree of chronic small vessel ischemia. Electronically Signed   By: Keith Rake M.D.   On: 03/04/2020 01:00   MR BRAIN WO CONTRAST  Result Date: 03/04/2020 CLINICAL DATA:  74 year old female with fall, confusion. Memory loss. EXAM: MRI HEAD WITHOUT CONTRAST TECHNIQUE: Multiplanar, multiecho pulse sequences of the brain and surrounding structures were obtained without intravenous contrast. COMPARISON:  Head CT earlier today.  Brain MRI 10/30/2018. FINDINGS: Brain: Cerebral volume is within normal limits for age. No restricted diffusion to suggest acute infarction. No midline shift, mass effect, evidence of mass lesion, ventriculomegaly, extra-axial collection or acute intracranial hemorrhage. Cervicomedullary junction and pituitary are within normal limits. Mild for age periventricular and other scattered bilateral cerebral white matter T2 and FLAIR hyperintensity is stable since last year. No cortical encephalomalacia. No definite chronic cerebral blood products. Bilateral mesial temporal lobes appear stable and normal for age. The deep gray nuclei remain within normal limits for age. Chronic T2 hyperintense foci in the pons suggesting chronic lacunar infarcts. The cerebellum remains normal. Vascular: Major intracranial vascular flow voids are stable since 2020. 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 Bone marrow signal is stable and within normal limits. Sinuses/Orbits: Stable and negative. Other: Mastoids remain clear. At the right palatine tonsil there is a new small 6 mm T2  hyperintense area since last year on coronal series 17, image 19. This is not evident on the remaining sequences today, except for coronal DWI where restricted diffusion is suggested (series 7, image 54). IMPRESSION: 1. No acute intracranial abnormality. Stable noncontrast MRI appearance of the brain since last year with mild for age chronic small vessel disease. 2. New small 6 mm T2 hyperintense lesion of the Right Palatine Tonsil, with some features of hypercellularity which argue against a benign retention cyst. Recommend follow-up with ENT, and if direct inspection of that tonsil is unremarkable then Neck CT with IV contrast may be valuable. Electronically Signed   By: Herminio Heads.D.  On: 03/04/2020 07:49      Flora Lipps, MD  [facility] 03/04/2020

## 2020-03-04 NOTE — ED Provider Notes (Signed)
[facility] EMERGENCY DEPARTMENT Provider Note  CSN: [id number] Arrival date & time: 03/03/20 1212  Chief Complaint(s) No chief complaint on file.  HPI [name] is a 74 y.o. female with a past medical history of below including polymyalgia and vitamin B12 deficiency who presents to the emergency department for elevated calcium levels noted at PCP. PCP visit was prompted by a recent fall that occurred 2 days ago. She reports that she had an episode of vertigo causing her to fall backwards hitting her head. She denies any loss of consciousness. She is not anticoagulated. Additionally for the past several weeks the patient has had generalized fatigue. Over the past week she has had increased thirst and polyuria with nausea, loss of appetite and increased memory loss (more than usual). She denies any recent fevers or infections. No associated chest pain or shortness of breath. No vomiting.  Patient reports recently starting vitamin D 1 month ago and being on a multivitamin.  The history is provided by the patient.   Past Medical History Past Medical History:  Diagnosis Date  . Backache, unspecified   . Hemiplegia, unspecified, affecting unspecified side   . Lacunar infarction ([place])   . Other and unspecified hyperlipidemia   . Polymyalgia ([place])   . Retinal defect, unspecified   . Rotator cuff injury    right shoulder  . Unspecified essential hypertension   . Venous tributary (branch) occlusion of retina    Patient Active Problem List   Diagnosis Date Noted  . Confusion 03/02/2020  . Memory loss 03/02/2020  . B12 deficiency 01/09/2019  . Vertigo 10/31/2018  . Hypokalemia 10/31/2018  . Chest pain 10/30/2018  . Carpal tunnel syndrome, bilateral 08/11/2018  . OTHER AND UNSPECIFIED HYPERLIPIDEMIA 11/22/2009  . LACUNAR INFARCTION 11/22/2009  . BACK PAIN, CHRONIC 11/22/2009  . UNSPECIFIED RETINAL DEFECT 11/08/2009  . ROTATOR CUFF INJURY, RIGHT SHOULDER  11/08/2009  . Green OCCLUSION 08/12/2007  . Essential hypertension 08/12/2007   Home Medication(s) Prior to Admission medications   Medication Sig Start Date End Date Taking? Authorizing Provider  Cholecalciferol (VITAMIN D3 PO) Take 1 tablet by mouth daily.   Yes [provider]  enalapril (VASOTEC) 20 MG tablet Take 0.5 tablets (10 mg total) by mouth daily. 03/02/20  Yes Laurey Morale, MD  hydrochlorothiazide (HYDRODIURIL) 25 MG tablet Take 1 tablet (25 mg total) by mouth daily. 03/02/20  Yes Laurey Morale, MD  Multiple Vitamin (MULTIVITAMIN PO) Take by mouth daily.   Yes [provider]  vitamin B-12 (CYANOCOBALAMIN) 1000 MCG tablet Take 1 tablet (1,000 mcg total) by mouth daily. Patient not taking: Reported on 03/03/2020 06/24/19   Laurey Morale, MD                                                                                                                                    Past Surgical History Past Surgical History:  Procedure Laterality Date  . ABDOMINAL HYSTERECTOMY    . CHOLECYSTECTOMY    . COLONOSCOPY  01-11-10   per Dr. Laurence Spates, internal hemorrhoids only, repeat in 10 yrs   . SPHINCTEROTOMY     Family History Family History  Problem Relation Age of Onset  . Heart disease Mother        died at age 91  . Hyperlipidemia Brother   . Hypertension Brother   . Diabetes Brother   . Heart disease Brother        CAD/MI/CABG  . Thyroid disease Daughter     Social History Social History   Tobacco Use  . Smoking status: Never Smoker  . Smokeless tobacco: Never Used  Vaping Use  . Vaping Use: Never used  Substance Use Topics  . Alcohol use: No    Alcohol/week: 0.0 standard drinks  . Drug use: No   Allergies Amoxicillin and Meloxicam  Review of Systems Review of Systems All other systems are reviewed and are negative for acute change except as noted in the HPI  Physical Exam Vital Signs  I have reviewed the triage vital  signs BP (!) 174/82 (BP Location: Right Arm)   Pulse 75   Temp 98.4 F (36.9 C) (Oral)   Resp 16   SpO2 100%   Physical Exam Vitals reviewed.  Constitutional:      General: She is not in acute distress.    Appearance: She is well-developed. She is not diaphoretic.  HENT:     Head: Normocephalic and atraumatic.     Nose: Nose normal.  Eyes:     General: No scleral icterus.       Right eye: No discharge.        Left eye: No discharge.     Conjunctiva/sclera: Conjunctivae normal.     Pupils: Pupils are equal, round, and reactive to light.  Cardiovascular:     Rate and Rhythm: Normal rate and regular rhythm.     Heart sounds: No murmur heard.  No friction rub. No gallop.   Pulmonary:     Effort: Pulmonary effort is normal. No respiratory distress.     Breath sounds: Normal breath sounds. No stridor. No rales.  Abdominal:     General: There is no distension.     Palpations: Abdomen is soft.     Tenderness: There is no abdominal tenderness.  Musculoskeletal:        General: No tenderness.     Cervical back: Normal range of motion and neck supple.  Skin:    General: Skin is warm and dry.     Findings: No erythema or rash.  Neurological:     Mental Status: She is alert and oriented to person, place, and time.     ED Results and Treatments Labs (all labs ordered are listed, but only abnormal results are displayed) Labs Reviewed  COMPREHENSIVE METABOLIC PANEL - Abnormal; Notable for the following components:      Result Value   Potassium 3.2 (*)    BUN 29 (*)    Creatinine, Ser 1.55 (*)    Calcium 12.8 (*)    GFR calc non Af Amer 33 (*)    GFR calc Af Amer 38 (*)    All other components within normal limits  SARS CORONAVIRUS 2 BY RT PCR (HOSPITAL ORDER, [facility])  CBC  EKG  EKG Interpretation  Date/Time:  Friday March 04 2020 00:57:19 EDT Ventricular Rate:  80 PR Interval:    QRS Duration: 96 QT Interval:  374 QTC Calculation: 432 R Axis:   35 Text Interpretation: Sinus rhythm No stemi. No acute changes Confirmed by Addison Lank [phone]) on 03/04/2020 1:40:18 AM      Radiology CT Head Wo Contrast  Result Date: 03/04/2020 CLINICAL DATA:  Fall.  Confusion.  Fatigue.  Nausea. EXAM: CT HEAD WITHOUT CONTRAST TECHNIQUE: Contiguous axial images were obtained from the base of the skull through the vertex without intravenous contrast. COMPARISON:  Head CT and brain MRI 10/30/2018 FINDINGS: Brain: Stable degree of chronic small vessel ischemia from prior exam. Brain volume is normal for age. No intracranial hemorrhage, mass effect, or midline shift. No hydrocephalus. The basilar cisterns are patent. No evidence of territorial infarct or acute ischemia. No extra-axial or intracranial fluid collection. Vascular: Atherosclerosis of skullbase vasculature without hyperdense vessel or abnormal calcification. Skull: No fracture or focal lesion. Sinuses/Orbits: Paranasal sinuses and mastoid air cells are clear. The visualized orbits are unremarkable. Other: None. IMPRESSION: 1. No acute intracranial abnormality. No skull fracture. 2. Stable degree of chronic small vessel ischemia. Electronically Signed   By: Keith Rake M.D.   On: 03/04/2020 01:00    Pertinent labs & imaging results that were available during my care of the patient were reviewed by me and considered in my medical decision making (see chart for details).  Medications Ordered in ED Medications  sodium chloride 0.9 % bolus 1,000 mL (has no administration in time range)    Followed by  0.9 %  sodium chloride infusion (has no administration in time range)                                                                                                                                    Procedures .Critical Care Performed by: Fatima Blank,  MD Authorized by: Fatima Blank, MD   Critical care provider statement:    Critical care time (minutes):  30   Critical care time was exclusive of:  Separately billable procedures and treating other patients   Critical care was necessary to treat or prevent imminent or life-threatening deterioration of the following conditions:  Metabolic crisis   Critical care was time spent personally by me on the following activities:  Discussions with consultants, evaluation of patient's response to treatment, examination of patient, ordering and performing treatments and interventions, ordering and review of laboratory studies, ordering and review of radiographic studies, pulse oximetry, re-evaluation of patient's condition, obtaining history from patient or surrogate, review of old charts, development of treatment plan with patient or surrogate and blood draw for specimens  .1-3 Lead EKG Interpretation Performed by: Fatima Blank, MD Authorized by: Fatima Blank, MD     Interpretation: normal     ECG rate:  68   ECG rate  assessment: normal     Rhythm: sinus rhythm     Ectopy: none     Conduction: normal      (including critical care time)  Medical Decision Making / ED Course I have reviewed the nursing notes for this encounter and the patient's prior records (if available in EHR or on provided paperwork).   ELIZETTE SHEK was evaluated in Emergency Department on 03/04/2020 for the symptoms described in the history of present illness. She was evaluated in the context of the global COVID-19 pandemic, which necessitated consideration that the patient might be at risk for infection with the SARS-CoV-2 virus that causes COVID-19. Institutional protocols and algorithms that pertain to the evaluation of patients at risk for COVID-19 are in a state of rapid change based on information released by regulatory bodies including the CDC and federal and state organizations. These  policies and algorithms were followed during the patient's care in the ED.  Patient is here for hypercalcemia. She is symptomatic..  Labs notable for calcium of 12.8 with AKI. No baseline for comparison. Patient's calcium was 14.3 at her PCP.  CT head due to head trauma obtained and negative.  Due to her being symptomatic, she will require IV hydration and admission to the hospital.     Final Clinical Impression(s) / ED Diagnoses Final diagnoses:  Hypercalcemia  AKI (acute kidney injury) ([place])      This chart was dictated using voice recognition software.  Despite best efforts to proofread,  errors can occur which can change the documentation meaning.   Fatima Blank, MD 03/04/20 [phone]

## 2020-03-04 NOTE — H&P (Signed)
History and Physical    [name] ALP:[id number] DOB: [birth year] DOA: 03/03/2020  PCP: Laurey Morale, MD  Patient coming from: Home.  Chief Complaint: Elevated calcium levels.  HPI: [name] is a 74 y.o. female with history of hypertension, polymyalgia rheumatica was advised to come to the ER after patient had blood work done with her PCP and was found to have markedly elevated calcium of 14.3.  Patient recently has been having increasing confusion weakness and falls.  Had gone to her primary care physician 2 days ago had blood work drawn and was found to have hypercalcemia.  Patient states that last 2 months ago patient was on steroids for polymyalgia rheumatica which was weaned off.  Over the last 1 month patient has been taking vitamin D3 over-the-counter.  Denies any chest pain nausea vomiting or diarrhea but has been having increasing forgetfulness and has been losing weight with some poor appetite.  ED Course: In the ER patient is alert awake oriented but appears confused.  CT head unremarkable.  Repeat calcium done today was around 12.8 with creatinine 1.5.  Creatinine was normal last December.  EKG shows normal sinus rhythm with QTC of 432 ms QRS of 96 ms.  Patient was given normal saline bolus and started on normal saline infusion admitted for symptomatic hypercalcemia.  Review of Systems: As per HPI, rest all negative.   Past Medical History:  Diagnosis Date  . Backache, unspecified   . Hemiplegia, unspecified, affecting unspecified side   . Lacunar infarction ([place])   . Other and unspecified hyperlipidemia   . Polymyalgia ([place])   . Retinal defect, unspecified   . Rotator cuff injury    right shoulder  . Unspecified essential hypertension   . Venous tributary (branch) occlusion of retina     Past Surgical History:  Procedure Laterality Date  . ABDOMINAL HYSTERECTOMY    . CHOLECYSTECTOMY    . COLONOSCOPY  01-11-10   per Dr. Laurence Spates, internal  hemorrhoids only, repeat in 10 yrs   . SPHINCTEROTOMY       reports that she has never smoked. She has never used smokeless tobacco. She reports that she does not drink alcohol and does not use drugs.  Allergies  Allergen Reactions  . Amoxicillin Nausea Only  . Meloxicam Other (See Comments)    Muscle weakness     Family History  Problem Relation Age of Onset  . Heart disease Mother        died at age 50  . Hyperlipidemia Brother   . Hypertension Brother   . Diabetes Brother   . Heart disease Brother        CAD/MI/CABG  . Thyroid disease Daughter     Prior to Admission medications   Medication Sig Start Date End Date Taking? Authorizing Provider  Cholecalciferol (VITAMIN D3 PO) Take 1 tablet by mouth daily.   Yes [provider]  enalapril (VASOTEC) 20 MG tablet Take 0.5 tablets (10 mg total) by mouth daily. 03/02/20  Yes Laurey Morale, MD  hydrochlorothiazide (HYDRODIURIL) 25 MG tablet Take 1 tablet (25 mg total) by mouth daily. 03/02/20  Yes Laurey Morale, MD  Multiple Vitamin (MULTIVITAMIN PO) Take by mouth daily.   Yes [provider]  vitamin B-12 (CYANOCOBALAMIN) 1000 MCG tablet Take 1 tablet (1,000 mcg total) by mouth daily. Patient not taking: Reported on 03/03/2020 06/24/19   Laurey Morale, MD    Physical Exam: Constitutional: Moderately built and nourished.  Vitals:   03/03/20 2207 03/04/20 0111 03/04/20 0200 03/04/20 0215  BP: (!) 174/82 (!) 188/85 (!) 171/76 (!) 164/87  Pulse: 75 63 75 77  Resp: 16 17 12 19   Temp:      TempSrc:      SpO2: 100% 99% 100% 100%   Eyes: Anicteric no pallor. ENMT: No discharge from the ears eyes nose or mouth. Neck: No mass felt.  No neck rigidity. Respiratory: No rhonchi or crepitations. Cardiovascular: S1-S2 heard. Abdomen: Soft nontender bowel sounds present. Musculoskeletal: No edema. Skin: No rash. Neurologic: Alert awake oriented to time place and person but appears mildly confused moves all  extremities. Psychiatric: Appears mildly confused.   Labs on Admission: I have personally reviewed following labs and imaging studies  CBC: Recent Labs  Lab 03/02/20 1721 03/03/20 1303  WBC 8.9 8.7  NEUTROABS 5,420  --   HGB 14.1 13.2  HCT 42.6 41.0  MCV 85.9 89.3  PLT 246 332   Basic Metabolic Panel: Recent Labs  Lab 03/02/20 1721 03/03/20 1303  NA 138 137  K 3.9 3.2*  CL 98 100  CO2 28 24  GLUCOSE 131* 92  BUN 31* 29*  CREATININE 1.48* 1.55*  CALCIUM 14.3* 12.8*   GFR: Estimated Creatinine Clearance: 27.6 mL/min (A) (by C-G formula based on SCr of 1.55 mg/dL (H)). Liver Function Tests: Recent Labs  Lab 03/02/20 1721 03/03/20 1303  AST 28 29  ALT 11 13  ALKPHOS  --  47  BILITOT 0.9 1.0  PROT 7.1 6.9  ALBUMIN  --  3.6   No results for input(s): LIPASE, AMYLASE in the last 168 hours. No results for input(s): AMMONIA in the last 168 hours. Coagulation Profile: No results for input(s): INR, PROTIME in the last 168 hours. Cardiac Enzymes: No results for input(s): CKTOTAL, CKMB, CKMBINDEX, TROPONINI in the last 168 hours. BNP (last 3 results) No results for input(s): PROBNP in the last 8760 hours. HbA1C: No results for input(s): HGBA1C in the last 72 hours. CBG: No results for input(s): GLUCAP in the last 168 hours. Lipid Profile: No results for input(s): CHOL, HDL, LDLCALC, TRIG, CHOLHDL, LDLDIRECT in the last 72 hours. Thyroid Function Tests: Recent Labs    03/02/20 1721  TSH 2.57   Anemia Panel: Recent Labs    03/02/20 1721  VITAMINB12 678   Urine analysis:    Component Value Date/Time   COLORURINE YELLOW 10/30/2018 1249   APPEARANCEUR CLEAR 10/30/2018 1249   LABSPEC 1.025 10/30/2018 1249   PHURINE 5.5 10/30/2018 1249   GLUCOSEU NEGATIVE 10/30/2018 1249   GLUCOSEU NEGATIVE 03/20/2011 1535   HGBUR NEGATIVE 10/30/2018 1249   BILIRUBINUR NEGATIVE 10/30/2018 1249   BILIRUBINUR n 12/27/2015 1203   [place] 10/30/2018 1249    PROTEINUR NEGATIVE 10/30/2018 1249   UROBILINOGEN 0.2 12/27/2015 1203   UROBILINOGEN 0.2 09/07/2013 1501   NITRITE NEGATIVE 10/30/2018 1249   LEUKOCYTESUR SMALL (A) 10/30/2018 1249   Sepsis Labs: @LABRCNTIP (procalcitonin:4,lacticidven:4) ) Recent Results (from the past 240 hour(s))  SARS Coronavirus 2 by RT PCR (hospital order, performed in [place] hospital lab) Nasopharyngeal Nasopharyngeal Swab     Status: None   Collection Time: 03/04/20  1:49 AM   Specimen: Nasopharyngeal Swab  Result Value Ref Range Status   SARS Coronavirus 2 NEGATIVE NEGATIVE Final    Comment: (NOTE) SARS-CoV-2 target nucleic acids are NOT DETECTED.  The SARS-CoV-2 RNA is generally detectable in upper and lower respiratory specimens during the acute phase of infection. The lowest concentration  of SARS-CoV-2 viral copies this assay can detect is 250 copies / mL. A negative result does not preclude SARS-CoV-2 infection and should not be used as the sole basis for treatment or other patient management decisions.  A negative result may occur with improper specimen collection / handling, submission of specimen other than nasopharyngeal swab, presence of viral mutation(s) within the areas targeted by this assay, and inadequate number of viral copies (<250 copies / mL). A negative result must be combined with clinical observations, patient history, and epidemiological information.  Fact Sheet for Patients:   StrictlyIdeas.no  Fact Sheet for Healthcare Providers: BankingDealers.co.za  This test is not yet approved or  cleared by the [place] FDA and has been authorized for detection and/or diagnosis of SARS-CoV-2 by FDA under an Emergency Use Authorization (EUA).  This EUA will remain in effect (meaning this test can be used) for the duration of the COVID-19 declaration under Section 564(b)(1) of the Act, 21 U.S.C. section 360bbb-3(b)(1), unless the  authorization is terminated or revoked sooner.  Performed at [facility], [place] [address] [place], [place] [address]      Radiological Exams on Admission: CT Head Wo Contrast  Result Date: 03/04/2020 CLINICAL DATA:  Fall.  Confusion.  Fatigue.  Nausea. EXAM: CT HEAD WITHOUT CONTRAST TECHNIQUE: Contiguous axial images were obtained from the base of the skull through the vertex without intravenous contrast. COMPARISON:  Head CT and brain MRI 10/30/2018 FINDINGS: Brain: Stable degree of chronic small vessel ischemia from prior exam. Brain volume is normal for age. No intracranial hemorrhage, mass effect, or midline shift. No hydrocephalus. The basilar cisterns are patent. No evidence of territorial infarct or acute ischemia. No extra-axial or intracranial fluid collection. Vascular: Atherosclerosis of skullbase vasculature without hyperdense vessel or abnormal calcification. Skull: No fracture or focal lesion. Sinuses/Orbits: Paranasal sinuses and mastoid air cells are clear. The visualized orbits are unremarkable. Other: None. IMPRESSION: 1. No acute intracranial abnormality. No skull fracture. 2. Stable degree of chronic small vessel ischemia. Electronically Signed   By: Keith Rake M.D.   On: 03/04/2020 01:00    EKG: Independently reviewed.  Normal sinus rhythm.  QTC of 432 ms.  Assessment/Plan Principal Problem:   Hypercalcemia Active Problems:   Essential hypertension    1. Symptomatic hypercalcemia -patient states she recently started taking vitamin D3 over-the-counter.  We will continue with aggressive hydration patient was given normal saline bolus in the ER and started on normal saline infusion.  Will check vitamin D levels, phosphorus levels, parathyroid hormone and parathormone-like peptide levels.  Follow metabolic panel.  Check chest x-ray.  Hold vitamin D3 supplementation hold hydrochlorothiazide. 2. Acute renal failure could be from poor oral intake.  Hydrate and  recheck metabolic panel.  Hold hydrochlorothiazide and ACE inhibitor. 3. Hypertension we will keep patient on as needed IV hydralazine for now. 4. Acute encephalopathy with confusion could be from hypercalcemia.  We will also check MRI brain B12 levels TSH ammonia levels. 5. History of polymyalgia rheumatica recently weaned off prednisone.  Will check sed rate CK levels also check cortisol levels.   DVT prophylaxis: Lovenox. Code Status: Full code. Family Communication: We will need to discuss with patient's family. Disposition Plan: Home. Consults called: None. Admission status: Observation.   Rise Patience MD [facility] Pager [phone].  If 7PM-7AM, please contact night-coverage [email or web address] Password [facility]  03/04/2020, 3:38 AM

## 2020-03-04 NOTE — ED Notes (Signed)
Off floor to MRI

## 2020-03-05 DIAGNOSIS — N182 Chronic kidney disease, stage 2 (mild): Secondary | ICD-10-CM

## 2020-03-05 LAB — CBC
HCT: 37.9 % (ref 36.0–46.0)
Hemoglobin: 12.1 g/dL (ref 12.0–15.0)
MCH: 27.9 pg (ref 26.0–34.0)
MCHC: 31.9 g/dL (ref 30.0–36.0)
MCV: 87.5 fL (ref 80.0–100.0)
Platelets: 193 10*3/uL (ref 150–400)
RBC: 4.33 MIL/uL (ref 3.87–5.11)
RDW: 14.4 % (ref 11.5–15.5)
WBC: 7.8 10*3/uL (ref 4.0–10.5)
nRBC: 0 % (ref 0.0–0.2)

## 2020-03-05 LAB — COMPREHENSIVE METABOLIC PANEL
ALT: 10 U/L (ref 0–44)
AST: 21 U/L (ref 15–41)
Albumin: 2.8 g/dL — ABNORMAL LOW (ref 3.5–5.0)
Alkaline Phosphatase: 43 U/L (ref 38–126)
Anion gap: 7 (ref 5–15)
BUN: 27 mg/dL — ABNORMAL HIGH (ref 8–23)
CO2: 26 mmol/L (ref 22–32)
Calcium: 11.5 mg/dL — ABNORMAL HIGH (ref 8.9–10.3)
Chloride: 105 mmol/L (ref 98–111)
Creatinine, Ser: 1.43 mg/dL — ABNORMAL HIGH (ref 0.44–1.00)
GFR calc Af Amer: 42 mL/min — ABNORMAL LOW (ref >60)
GFR calc non Af Amer: 36 mL/min — ABNORMAL LOW (ref >60)
Glucose, Bld: 104 mg/dL — ABNORMAL HIGH (ref 70–99)
Potassium: 3.7 mmol/L (ref 3.5–5.1)
Sodium: 138 mmol/L (ref 135–145)
Total Bilirubin: 0.5 mg/dL (ref 0.3–1.2)
Total Protein: 5.8 g/dL — ABNORMAL LOW (ref 6.5–8.1)

## 2020-03-05 LAB — MAGNESIUM: Magnesium: 1.7 mg/dL (ref 1.7–2.4)

## 2020-03-05 LAB — PTH, INTACT AND CALCIUM
Calcium, Total (PTH): 11.7 mg/dL — ABNORMAL HIGH (ref 8.7–10.3)
PTH: 11 pg/mL — ABNORMAL LOW (ref 15–65)

## 2020-03-05 LAB — PHOSPHORUS: Phosphorus: 2.6 mg/dL (ref 2.5–4.6)

## 2020-03-05 MED ORDER — AMLODIPINE BESYLATE 5 MG PO TABS
5.0000 mg | ORAL_TABLET | Freq: Every day | ORAL | Status: DC
  Administered 2020-03-06: 5 mg via ORAL
  Filled 2020-03-05: qty 1

## 2020-03-05 MED ORDER — ACETAMINOPHEN 325 MG PO TABS
650.0000 mg | ORAL_TABLET | Freq: Four times a day (QID) | ORAL | Status: DC | PRN
  Filled 2020-03-05 (×2): qty 2

## 2020-03-05 MED ORDER — SODIUM CHLORIDE 0.9 % IV SOLN
1000.0000 mL | INTRAVENOUS | Status: DC
  Administered 2020-03-05 (×2): 1000 mL via INTRAVENOUS

## 2020-03-05 MED ORDER — MAGNESIUM SULFATE 2 GM/50ML IV SOLN
2.0000 g | Freq: Once | INTRAVENOUS | Status: AC
  Administered 2020-03-05: 2 g via INTRAVENOUS
  Filled 2020-03-05: qty 50

## 2020-03-05 NOTE — Progress Notes (Signed)
PROGRESS NOTE    [name]  NFA:[id number] DOB: 01-30-1946 DOA: 03/03/2020 PCP: Laurey Morale, MD    Brief Narrative:  Patient admitted to the hospital with a working diagnosis of symptomatic hypercalcemia  Female with past medical history for hypertension, polymyalgia rheumatica who presented from her primary care physician with a serum calcium of 14.3.  Patient reported to have increasing confusion, generalized weakness and frequent falls at home.  Over the last month patient has been taking vitamin D3 over-the-counter.  On her initial physical examination blood pressure 174/82, heart rate 75, respiratory rate 16, oxygen saturation 99%, lungs clear to auscultation bilaterally, heart S1-S2, present rhythm, soft abdomen, no lower extremity edema. 1, potassium 3.2, chloride 100, bicarb 24, glucose 92, BUN 29, creatinine 1.55, calcium 12.8, albumin 3.6, white count 8.7, hemoglobin 13.2, hematocrit 41.0, platelets 212.  Hydroxy vitamin D 67.8.   Patient has been placed on IV fluids with improvement of serum calcium. HCTZ and vitamin d has been held.   Assessment & Plan:   Principal Problem:   Hypercalcemia Active Problems:   Essential hypertension   1. AKI on CKD stage 2/ hypercalcemia/ hypomagnesemia. Serum ca is down to 11,5 corrected for albumin is 12,5.  Intact PTH is 11, TSH is 1,75.  Today renal function has a serum cr of 1,43 with K at 3,7 and serum bicarbonate 26, Mg 1,7 and P 2,6.   Continue saline hydration and follow up on renal function and electrolytes in am. Correct Mg with mag sulfate 2 g  Out of bed to chair tid with meals, Pt and Ot evaluations.   2. HTN. Blood pressure stable 140 to 160 mmHg, will continue to hold on diuretic therapy. Will add amlodipine 5 mg.   Resume ace inh once renal function more stable.   3. Vitamins deficiencies. Continue with multivitamins and B12. Hod on vitamin D 3     Status is: Inpatient  Remains inpatient appropriate  because:IV treatments appropriate due to intensity of illness or inability to take PO   Dispo: The patient is from: Home              Anticipated d/c is to: Home              Anticipated d/c date is: 1 day              Patient currently is not medically stable to d/c.   DVT prophylaxis: Enoxaparin   Code Status:   full  Family Communication:  I spoke with patient's daughter at the bedside, we talked in detail about patient's condition, plan of care and prognosis and all questions were addressed.     Subjective: Patient feeling better but not yet back to baseline, continue to feel very weak and deconditioned, has not walk out of her room yet.   Objective: Vitals:   03/05/20 0240 03/05/20 0420 03/05/20 0607 03/05/20 1337  BP: (!) 176/87 (!) 132/56 138/67 (!) 145/74  Pulse: 88 87 88 73  Resp: 15 18 16 18   Temp: 98.7 F (37.1 C) 98.6 F (37 C) 98.2 F (36.8 C) 98.2 F (36.8 C)  TempSrc: Oral Oral Oral Oral  SpO2: 96% 97% 97% 98%  Weight:      Height:        Intake/Output Summary (Last 24 hours) at 03/05/2020 1729 Last data filed at 03/05/2020 1511 Gross per 24 hour  Intake 1489.83 ml  Output 1925 ml  Net -435.17 ml   [employer]  03/04/20 0354  Weight: 59.9 kg    Examination:   General: Not in pain or dyspnea., deconditioned  Neurology: Awake and alert, non focal  E ENT: no pallor, no icterus, oral mucosa moist Cardiovascular: No JVD. S1-S2 present, rhythmic, no gallops, rubs, or murmurs. No lower extremity edema. Pulmonary: positive breath sounds bilaterally, adequate air movement, no wheezing, rhonchi or rales. Gastrointestinal. Abdomen soft and non tender Skin. No rashes Musculoskeletal: no joint deformities     Data Reviewed: I have personally reviewed following labs and imaging studies  CBC: Recent Labs  Lab 03/02/20 1721 03/03/20 1303 03/04/20 0353 03/05/20 0409  WBC 8.9 8.7 6.7 7.8  NEUTROABS 5,420  --   --   --   HGB 14.1 13.2 13.2 12.1   HCT 42.6 41.0 41.3 37.9  MCV 85.9 89.3 88.1 87.5  PLT 246 212 191 592   Basic Metabolic Panel: Recent Labs  Lab 03/02/20 1721 03/03/20 1303 03/04/20 0353 03/04/20 0629 03/05/20 0409  NA 138 137  --  135 138  K 3.9 3.2*  --  3.6 3.7  CL 98 100  --  106 105  CO2 28 24  --  23 26  GLUCOSE 131* 92  --  81 104*  BUN 31* 29*  --  24* 27*  CREATININE 1.48* 1.55* 1.32* 1.18* 1.43*  CALCIUM 14.3* 12.8*  --  11.3*  11.7* 11.5*  MG  --   --   --   --  1.7  PHOS  --   --  2.6  --  2.6   GFR: Estimated Creatinine Clearance: 27.7 mL/min (A) (by C-G formula based on SCr of 1.43 mg/dL (H)). Liver Function Tests: Recent Labs  Lab 03/02/20 1721 03/03/20 1303 03/05/20 0409  AST 28 29 21   ALT 11 13 10   ALKPHOS  --  47 43  BILITOT 0.9 1.0 0.5  PROT 7.1 6.9 5.8*  ALBUMIN  --  3.6 2.8*   No results for input(s): LIPASE, AMYLASE in the last 168 hours. Recent Labs  Lab 03/04/20 0629  AMMONIA 25   Coagulation Profile: No results for input(s): INR, PROTIME in the last 168 hours. Cardiac Enzymes: Recent Labs  Lab 03/04/20 0629  CKTOTAL 64   BNP (last 3 results) No results for input(s): PROBNP in the last 8760 hours. HbA1C: No results for input(s): HGBA1C in the last 72 hours. CBG: No results for input(s): GLUCAP in the last 168 hours. Lipid Profile: No results for input(s): CHOL, HDL, LDLCALC, TRIG, CHOLHDL, LDLDIRECT in the last 72 hours. Thyroid Function Tests: Recent Labs    03/04/20 0629  TSH 1.755   Anemia Panel: No results for input(s): VITAMINB12, FOLATE, FERRITIN, TIBC, IRON, RETICCTPCT in the last 72 hours.    Radiology Studies: I have reviewed all of the imaging during this hospital visit personally     Scheduled Meds: . enoxaparin (LOVENOX) injection  30 mg Subcutaneous Q24H   Continuous Infusions: . sodium chloride 1,000 mL (03/05/20 1005)     LOS: 1 day        Brylin Stanislawski Gerome Apley, MD

## 2020-03-06 DIAGNOSIS — N182 Chronic kidney disease, stage 2 (mild): Secondary | ICD-10-CM

## 2020-03-06 DIAGNOSIS — M353 Polymyalgia rheumatica: Secondary | ICD-10-CM

## 2020-03-06 DIAGNOSIS — N179 Acute kidney failure, unspecified: Secondary | ICD-10-CM

## 2020-03-06 LAB — BASIC METABOLIC PANEL
Anion gap: 11 (ref 5–15)
BUN: 18 mg/dL (ref 8–23)
CO2: 22 mmol/L (ref 22–32)
Calcium: 11.2 mg/dL — ABNORMAL HIGH (ref 8.9–10.3)
Chloride: 108 mmol/L (ref 98–111)
Creatinine, Ser: 1.12 mg/dL — ABNORMAL HIGH (ref 0.44–1.00)
GFR calc Af Amer: 56 mL/min — ABNORMAL LOW (ref >60)
GFR calc non Af Amer: 49 mL/min — ABNORMAL LOW (ref >60)
Glucose, Bld: 81 mg/dL (ref 70–99)
Potassium: 3.3 mmol/L — ABNORMAL LOW (ref 3.5–5.1)
Sodium: 141 mmol/L (ref 135–145)

## 2020-03-06 LAB — MAGNESIUM: Magnesium: 2.3 mg/dL (ref 1.7–2.4)

## 2020-03-06 MED ORDER — AMLODIPINE BESYLATE 5 MG PO TABS: 5.0000 mg | ORAL_TABLET | Freq: Every day | ORAL | 0 refills | Status: DC

## 2020-03-06 MED ORDER — POLYETHYLENE GLYCOL 3350 17 G PO PACK
17.0000 g | PACK | Freq: Every day | ORAL | Status: DC
  Filled 2020-03-06: qty 1

## 2020-03-06 MED ORDER — BISACODYL 5 MG PO TBEC
10.0000 mg | DELAYED_RELEASE_TABLET | Freq: Once | ORAL | Status: DC
  Filled 2020-03-06: qty 2

## 2020-03-06 MED ORDER — POLYETHYLENE GLYCOL 3350 17 G PO PACK: 17.0000 g | PACK | Freq: Every day | ORAL | 0 refills | Status: DC

## 2020-03-06 MED ORDER — POTASSIUM CHLORIDE CRYS ER 20 MEQ PO TBCR
40.0000 meq | EXTENDED_RELEASE_TABLET | ORAL | Status: DC
  Administered 2020-03-06: 40 meq via ORAL
  Filled 2020-03-06: qty 2

## 2020-03-06 NOTE — Discharge Summary (Signed)
Physician Discharge Summary  [name] VPX:[id number] DOB: Feb 08, 1946 DOA: 03/03/2020  PCP: Laurey Morale, MD  Admit date: 03/03/2020 Discharge date: 03/06/2020  Admitted From: Home  Disposition:  Home   Recommendations for Outpatient Follow-up and new medication changes:  1. Follow up with Dr. Sarajane Jews in 7 days.  2. Discontinued hctz and vitamin D supplementation due to hypercalcemia. 3. Started on amlodipine for blood pressure control. 4. Added Miralax for constipation.  5. Patient will need an outpatient ENT referral for further evaluation of her new small 6 mm T2 hyperintense lesion on the right palatine tonsil. 6. Follow up renal panel in 7 days.   Home Health: no   Equipment/Devices: no    Discharge Condition: stable   CODE STATUS: full  Diet recommendation: heart healthy   Brief/Interim Summary: Patient admitted to the hospital with a working diagnosis of symptomatic hypercalcemia  74 yo female with past medical history for hypertension and polymyalgia rheumatica who presented from her primary care physician with a serum calcium of 14.3.  Patient reported to have increasing confusion, generalized weakness and frequent falls at home.  Over the last month patient has been taking vitamin D3 over-the-counter.  On her initial physical examination blood pressure 174/82, heart rate 75, respiratory rate 16, oxygen saturation 99%, lungs were clear to auscultation bilaterally, heart S1-S2, present rhythm, soft abdomen, no lower extremity edema. Sodium 137, potassium 3.2, chloride 100, bicarb 24, glucose 92, BUN 29, creatinine 1.55, calcium 12.8, albumin 3.6, white count 8.7, hemoglobin 13.2, hematocrit 41.0, platelets 212.  Hydroxy vitamin D 67.8.   SARS COVID-19 negative.  Head CT no acute changes. Brain MRI no acute changes.  New small 6 mm T2 hyperintense lesion of the right upper abdomen possible.  Positive features of hypercellularity with argue against benign cyst.  Recommend  follow-up with ENT as an outpatient. EKG 80 bpm, normal axis, normal intervals, sinus rhythm, no ST segment or T wave changes.   Patient was placed on IV fluids with improvement of serum calcium. HCTZ and vitamin d have been held.  1.  Acute kidney injury on chronic kidney disease stage II with symptomatic hypercalcemia/hypomagnesemia/hypokalemia.  She was admitted to the medical ward, she was placed on a remote telemetry monitor.  Received isotonic saline intravenously with good toleration. Her PTH was 11, TSH 1.75.   Electrolytes were corrected with potassium chloride and magnesium sulfate.  Vitamin D3 and hydrochlorothiazide were discontinued  At the time of her discharge sodium 141, potassium 3.3, chloride 108, bicarb 22, glucose 81, BUN 18, creatinine 1.12, calcium 11.2 (corrected for albumin 12.2) , magnesium 2.3, phosphorus 2.6 Patient received potassium chloride 80 mEq before discharge.  Recommendation for follow-up BMP as an outpatient within 7 days.  2.  Hypertension.  Patient was placed on amlodipine for blood pressure control during her hospitalization.  ACE inhibitor's were held due to acute kidney injury.  Patient will resume enalapril 10 mg daily.   4.  Vitamin deficiency.  25 hydroxy Vitamin D 67.84, within normal ranges, continue multivitamins, hold B12 and vitamin D3.  5. Constipation. Patient has been placed on Miralax for bowel regimen.   Discharge Diagnoses:  Principal Problem:   Hypercalcemia Active Problems:   Essential hypertension   Hypokalemia   AKI (acute kidney injury) ([place])   CKD stage G2/A2, GFR 60-89 and albumin creatinine ratio 30-299 mg/g   Hypomagnesemia   Polymyalgia rheumatica ([facility])    Discharge Instructions   Allergies as of 03/06/2020  Reactions   Amoxicillin Nausea Only   Meloxicam Other (See Comments)   Muscle weakness       Medication List    STOP taking these medications   hydrochlorothiazide 25 MG tablet Commonly known as:  HYDRODIURIL   vitamin B-12 1000 MCG tablet Commonly known as: CYANOCOBALAMIN   VITAMIN D3 PO     TAKE these medications   amLODipine 5 MG tablet Commonly known as: NORVASC Take 1 tablet (5 mg total) by mouth daily.   enalapril 20 MG tablet Commonly known as: VASOTEC Take 0.5 tablets (10 mg total) by mouth daily.   MULTIVITAMIN PO Take by mouth daily.   polyethylene glycol 17 g packet Commonly known as: MIRALAX / GLYCOLAX Take 17 g by mouth daily.       Follow-up Information    Laurey Morale, MD Follow up in 1 week(s).   Specialty: Family Medicine Contact information: [address] [place] [address] [phone]              Allergies  Allergen Reactions  . Amoxicillin Nausea Only  . Meloxicam Other (See Comments)    Muscle weakness        Procedures/Studies: CT Head Wo Contrast  Result Date: 03/04/2020 CLINICAL DATA:  Fall.  Confusion.  Fatigue.  Nausea. EXAM: CT HEAD WITHOUT CONTRAST TECHNIQUE: Contiguous axial images were obtained from the base of the skull through the vertex without intravenous contrast. COMPARISON:  Head CT and brain MRI 10/30/2018 FINDINGS: Brain: Stable degree of chronic small vessel ischemia from prior exam. Brain volume is normal for age. No intracranial hemorrhage, mass effect, or midline shift. No hydrocephalus. The basilar cisterns are patent. No evidence of territorial infarct or acute ischemia. No extra-axial or intracranial fluid collection. Vascular: Atherosclerosis of skullbase vasculature without hyperdense vessel or abnormal calcification. Skull: No fracture or focal lesion. Sinuses/Orbits: Paranasal sinuses and mastoid air cells are clear. The visualized orbits are unremarkable. Other: None. IMPRESSION: 1. No acute intracranial abnormality. No skull fracture. 2. Stable degree of chronic small vessel ischemia. Electronically Signed   By: Keith Rake M.D.   On: 03/04/2020 01:00   MR BRAIN WO CONTRAST  Result  Date: 03/04/2020 CLINICAL DATA:  74 year old female with fall, confusion. Memory loss. EXAM: MRI HEAD WITHOUT CONTRAST TECHNIQUE: Multiplanar, multiecho pulse sequences of the brain and surrounding structures were obtained without intravenous contrast. COMPARISON:  Head CT earlier today.  Brain MRI 10/30/2018. FINDINGS: Brain: Cerebral volume is within normal limits for age. No restricted diffusion to suggest acute infarction. No midline shift, mass effect, evidence of mass lesion, ventriculomegaly, extra-axial collection or acute intracranial hemorrhage. Cervicomedullary junction and pituitary are within normal limits. Mild for age periventricular and other scattered bilateral cerebral white matter T2 and FLAIR hyperintensity is stable since last year. No cortical encephalomalacia. No definite chronic cerebral blood products. Bilateral mesial temporal lobes appear stable and normal for age. The deep gray nuclei remain within normal limits for age. Chronic T2 hyperintense foci in the pons suggesting chronic lacunar infarcts. The cerebellum remains normal. Vascular: Major intracranial vascular flow voids are stable since 2020. 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 Bone marrow signal is stable and within normal limits. Sinuses/Orbits: Stable and negative. Other: Mastoids remain clear. At the right palatine tonsil there is a new small 6 mm T2 hyperintense area since last year on coronal series 17, image 19. This is not evident on the remaining sequences today, except for  coronal DWI where restricted diffusion is suggested (series 7, image 54). IMPRESSION: 1. No acute intracranial abnormality. Stable noncontrast MRI appearance of the brain since last year with mild for age chronic small vessel disease. 2. New small 6 mm T2 hyperintense lesion of the Right Palatine Tonsil, with some features of hypercellularity which argue against a benign  retention cyst. Recommend follow-up with ENT, and if direct inspection of that tonsil is unremarkable then Neck CT with IV contrast may be valuable. Electronically Signed   By: Genevie Ann M.D.   On: 03/04/2020 07:49       Subjective: Patient is feeling better, no nausea or vomiting, positive constipation.   Discharge Exam: Vitals:   03/06/20 0000 03/06/20 0527  BP: (!) 160/72 (!) 152/69  Pulse:  80  Resp:  18  Temp:  98.1 F (36.7 C)  SpO2:  98%   Vitals:   03/05/20 1337 03/05/20 2200 03/06/20 0000 03/06/20 0527  BP: (!) 145/74 (!) 185/79 (!) 160/72 (!) 152/69  Pulse: 73 72  80  Resp: 18 18  18   Temp: 98.2 F (36.8 C) 98.5 F (36.9 C)  98.1 F (36.7 C)  TempSrc: Oral Oral  Oral  SpO2: 98% 96%  98%  Weight:      Height:        General: Not in pain or dyspnea  Neurology: Awake and alert, non focal  E ENT: mild pallor, no icterus, oral mucosa moist Cardiovascular: No JVD. S1-S2 present, rhythmic, no gallops, rubs, or murmurs. No lower extremity edema. Pulmonary: positive breath sounds bilaterally, adequate air movement, no wheezing, rhonchi or rales. Gastrointestinal. Abdomen soft and non tender.  Skin. No rashes Musculoskeletal: no joint deformities   The results of significant diagnostics from this hospitalization (including imaging, microbiology, ancillary and laboratory) are listed below for reference.     Microbiology: Recent Results (from the past 240 hour(s))  SARS Coronavirus 2 by RT PCR (hospital order, performed in [facility] hospital lab) Nasopharyngeal Nasopharyngeal Swab     Status: None   Collection Time: 03/04/20  1:49 AM   Specimen: Nasopharyngeal Swab  Result Value Ref Range Status   SARS Coronavirus 2 NEGATIVE NEGATIVE Final    Comment: (NOTE) SARS-CoV-2 target nucleic acids are NOT DETECTED.  The SARS-CoV-2 RNA is generally detectable in upper and lower respiratory specimens during the acute phase of infection. The lowest concentration of  SARS-CoV-2 viral copies this assay can detect is 250 copies / mL. A negative result does not preclude SARS-CoV-2 infection and should not be used as the sole basis for treatment or other patient management decisions.  A negative result may occur with improper specimen collection / handling, submission of specimen other than nasopharyngeal swab, presence of viral mutation(s) within the areas targeted by this assay, and inadequate number of viral copies (<250 copies / mL). A negative result must be combined with clinical observations, patient history, and epidemiological information.  Fact Sheet for Patients:   StrictlyIdeas.no  Fact Sheet for Healthcare Providers: BankingDealers.co.za  This test is not yet approved or  cleared by the [place] FDA and has been authorized for detection and/or diagnosis of SARS-CoV-2 by FDA under an Emergency Use Authorization (EUA).  This EUA will remain in effect (meaning this test can be used) for the duration of the COVID-19 declaration under Section 564(b)(1) of the Act, 21 U.S.C. section 360bbb-3(b)(1), unless the authorization is terminated or revoked sooner.  Performed at [facility], [place],  [place] [address]      Labs: BNP (last 3 results) No results for input(s): BNP in the last 8760 hours. Basic Metabolic Panel: Recent Labs  Lab 03/02/20 1721 03/02/20 1721 03/03/20 1303 03/04/20 0353 03/04/20 0629 03/05/20 0409 03/06/20 0034  NA 138  --  137  --  135 138 141  K 3.9  --  3.2*  --  3.6 3.7 3.3*  CL 98  --  100  --  106 105 108  CO2 28  --  24  --  23 26 22   GLUCOSE 131*  --  92  --  81 104* 81  BUN 31*  --  29*  --  24* 27* 18  CREATININE 1.48*  --  1.55* 1.32* 1.18* 1.43* 1.12*  CALCIUM 14.3*   < > 12.8*  --  11.3*  11.7* 11.5* 11.2*  MG  --   --   --   --   --  1.7 2.3  PHOS  --   --   --  2.6  --  2.6  --    < > = values in this interval not  displayed.   Liver Function Tests: Recent Labs  Lab 03/02/20 1721 03/03/20 1303 03/05/20 0409  AST 28 29 21   ALT 11 13 10   ALKPHOS  --  47 43  BILITOT 0.9 1.0 0.5  PROT 7.1 6.9 5.8*  ALBUMIN  --  3.6 2.8*   No results for input(s): LIPASE, AMYLASE in the last 168 hours. Recent Labs  Lab 03/04/20 0629  AMMONIA 25   CBC: Recent Labs  Lab 03/02/20 1721 03/03/20 1303 03/04/20 0353 03/05/20 0409  WBC 8.9 8.7 6.7 7.8  NEUTROABS 5,420  --   --   --   HGB 14.1 13.2 13.2 12.1  HCT 42.6 41.0 41.3 37.9  MCV 85.9 89.3 88.1 87.5  PLT 246 212 191 193   Cardiac Enzymes: Recent Labs  Lab 03/04/20 0629  CKTOTAL 64   BNP: Invalid input(s): POCBNP CBG: No results for input(s): GLUCAP in the last 168 hours. D-Dimer No results for input(s): DDIMER in the last 72 hours. Hgb A1c No results for input(s): HGBA1C in the last 72 hours. Lipid Profile No results for input(s): CHOL, HDL, LDLCALC, TRIG, CHOLHDL, LDLDIRECT in the last 72 hours. Thyroid function studies Recent Labs    03/04/20 0629  TSH 1.755   Anemia work up No results for input(s): VITAMINB12, FOLATE, FERRITIN, TIBC, IRON, RETICCTPCT in the last 72 hours. Urinalysis    Component Value Date/Time   COLORURINE YELLOW 10/30/2018 1249   APPEARANCEUR CLEAR 10/30/2018 1249   LABSPEC 1.025 10/30/2018 1249   PHURINE 5.5 10/30/2018 1249   GLUCOSEU NEGATIVE 10/30/2018 1249   GLUCOSEU NEGATIVE 03/20/2011 1535   HGBUR NEGATIVE 10/30/2018 1249   BILIRUBINUR NEGATIVE 10/30/2018 1249   BILIRUBINUR n 12/27/2015 1203   KETONESUR NEGATIVE 10/30/2018 1249   PROTEINUR NEGATIVE 10/30/2018 1249   UROBILINOGEN 0.2 12/27/2015 1203   UROBILINOGEN 0.2 09/07/2013 1501   NITRITE NEGATIVE 10/30/2018 1249   LEUKOCYTESUR SMALL (A) 10/30/2018 1249   Sepsis Labs Invalid input(s): PROCALCITONIN,  WBC,  LACTICIDVEN Microbiology Recent Results (from the past 240 hour(s))  SARS Coronavirus 2 by RT PCR (hospital order, performed in [place] hospital lab) Nasopharyngeal Nasopharyngeal Swab     Status: None   Collection Time: 03/04/20  1:49 AM   Specimen: Nasopharyngeal Swab  Result Value Ref Range Status   SARS Coronavirus 2 NEGATIVE NEGATIVE Final  Comment: (NOTE) SARS-CoV-2 target nucleic acids are NOT DETECTED.  The SARS-CoV-2 RNA is generally detectable in upper and lower respiratory specimens during the acute phase of infection. The lowest concentration of SARS-CoV-2 viral copies this assay can detect is 250 copies / mL. A negative result does not preclude SARS-CoV-2 infection and should not be used as the sole basis for treatment or other patient management decisions.  A negative result may occur with improper specimen collection / handling, submission of specimen other than nasopharyngeal swab, presence of viral mutation(s) within the areas targeted by this assay, and inadequate number of viral copies (<250 copies / mL). A negative result must be combined with clinical observations, patient history, and epidemiological information.  Fact Sheet for Patients:   StrictlyIdeas.no  Fact Sheet for Healthcare Providers: BankingDealers.co.za  This test is not yet approved or  cleared by the [place] FDA and has been authorized for detection and/or diagnosis of SARS-CoV-2 by FDA under an Emergency Use Authorization (EUA).  This EUA will remain in effect (meaning this test can be used) for the duration of the COVID-19 declaration under Section 564(b)(1) of the Act, 21 U.S.C. section 360bbb-3(b)(1), unless the authorization is terminated or revoked sooner.  Performed at [facility], [place] [address] [place], [place] [address]      Time coordinating discharge: 45 minutes  SIGNED:   Tawni Millers, MD  [facility] 03/06/2020, 8:30 AM

## 2020-03-06 NOTE — Discharge Planning (Signed)
Patient discharged home in stable condition. Verbalizes understanding of all discharge instructions, including home medications and follow up appointments. 

## 2020-03-06 NOTE — Evaluation (Signed)
Physical Therapy Evaluation Patient Details Name: [name] MRN: [id number] DOB: 02/21/1946 Today's Date: 03/06/2020   History of Present Illness  73 yo female with past medical history for hypertension and polymyalgia rheumatica who presented from her primary care physician with a serum calcium of 14.3.  Patient reported to have increasing confusion, generalized weakness and frequent falls at home.    Clinical Impression  PT eval complete. Pt required min assist bed mobility, min guard assist transfers, and min guard assist ambulation 325' with RW. Pt requires increased time to complete all mobility skills. She presents with decreased balance and strength. Plan is for d/c home today. Family able to provide 24-hour assist. Pt has RW at home. Pt agreeable to HHPT.     Follow Up Recommendations Home health PT;Supervision/Assistance - 24 hour    Equipment Recommendations  None recommended by PT    Recommendations for Other Services       Precautions / Restrictions Precautions Precautions: Fall      Mobility  Bed Mobility Overal bed mobility: Needs Assistance Bed Mobility: Supine to Sit     Supine to sit: Min assist     General bed mobility comments: +rail, increased time, cues for sequencing, assist to elevate trunk and scoot to EOB  Transfers Overall transfer level: Needs assistance Equipment used: Rolling walker (2 wheeled) Transfers: Sit to/from Stand Sit to Stand: Min guard         General transfer comment: cues for hand placement, increased time, multi attempts from low surface  Ambulation/Gait Ambulation/Gait assistance: Min guard Gait Distance (Feet): 325 Feet Assistive device: Rolling walker (2 wheeled) Gait Pattern/deviations: Step-through pattern;Decreased stride length Gait velocity: decreased Gait velocity interpretation: 1.31 - 2.62 ft/sec, indicative of limited community ambulator General Gait Details: mildly unsteady gait with RW. Pt insisting  on ambulating in flip flops.  Stairs            Wheelchair Mobility    Modified Rankin (Stroke Patients Only)       Balance Overall balance assessment: Needs assistance Sitting-balance support: No upper extremity supported;Feet supported Sitting balance-Leahy Scale: Good     Standing balance support: Bilateral upper extremity supported;During functional activity Standing balance-Leahy Scale: Poor Standing balance comment: reliant on BUE support                             Pertinent Vitals/Pain Pain Assessment: Faces Faces Pain Scale: Hurts a little bit Pain Location: generalized Pain Descriptors / Indicators: Grimacing;Discomfort Pain Intervention(s): Monitored during session;Repositioned    Home Living Family/patient expects to be discharged to:: Private residence Living Arrangements: Spouse/significant other;Children Available Help at Discharge: Family;Available 24 hours/day Type of Home: House Home Access: Stairs to enter Entrance Stairs-Rails: None Entrance Stairs-Number of Steps: 1 Home Layout: Two level;Laundry or work area in basement Home Equipment: Walker - 2 wheels      Prior Function Level of Independence: Independent               Hand Dominance        Extremity/Trunk Assessment   Upper Extremity Assessment Upper Extremity Assessment: Defer to OT evaluation    Lower Extremity Assessment Lower Extremity Assessment: Generalized weakness    Cervical / Trunk Assessment Cervical / Trunk Assessment: Kyphotic  Communication   Communication: No difficulties  Cognition Arousal/Alertness: Awake/alert Behavior During Therapy: WFL for tasks assessed/performed Overall Cognitive Status: Impaired/Different from baseline Area of Impairment: Safety/judgement;Problem solving                           Safety/Judgement: Decreased awareness of safety   Problem Solving: Slow processing;Difficulty sequencing;Requires verbal  cues        General Comments General comments (skin integrity, edema, etc.): max HR 102. Mild SOB after ambulation, which pt relates to having to wear mask in hallway.    Exercises     Assessment/Plan    PT Assessment All further PT needs can be met in the next venue of care  PT Problem List Decreased strength;Decreased mobility;Decreased safety awareness;Decreased knowledge of precautions;Decreased activity tolerance;Decreased cognition;Decreased balance;Pain       PT Treatment Interventions      PT Goals (Current goals can be found in the Care Plan section)  Acute Rehab PT Goals Patient Stated Goal: feel better PT Goal Formulation: All assessment and education complete, DC therapy    Frequency     Barriers to discharge        Co-evaluation               AM-PAC PT "6 Clicks" Mobility  Outcome Measure Help needed turning from your back to your side while in a flat bed without using bedrails?: A Little Help needed moving from lying on your back to sitting on the side of a flat bed without using bedrails?: A Little Help needed moving to and from a bed to a chair (including a wheelchair)?: A Little Help needed standing up from a chair using your arms (e.g., wheelchair or bedside chair)?: A Little Help needed to walk in hospital room?: A Little Help needed climbing 3-5 steps with a railing? : A Little 6 Click Score: 18    End of Session Equipment Utilized During Treatment: Gait belt Activity Tolerance: Patient tolerated treatment well Patient left: in chair;with call bell/phone within reach;with chair alarm set Nurse Communication: Mobility status PT Visit Diagnosis: Unsteadiness on feet (R26.81);Muscle weakness (generalized) (M62.81)    Time: 2774-1287 PT Time Calculation (min) (ACUTE ONLY): 34 min   Charges:   PT Evaluation $PT Eval Moderate Complexity: 1 Mod PT Treatments $Gait Training: 8-22 mins        Lorrin Goodell, PT  Office # [phone] Pager  [phone]   Lorriane Shire 03/06/2020, 9:55 AM

## 2020-03-06 NOTE — Evaluation (Signed)
Occupational Therapy Evaluation Patient Details Name: [name] MRN: [id number] DOB: 1946/04/06 Today's Date: 03/06/2020    History of Present Illness 74 yo female with past medical history for hypertension and polymyalgia rheumatica who presented from her primary care physician with a serum calcium of 14.3.  Patient reported to have increasing confusion, generalized weakness and frequent falls at home.   Clinical Impression   PTA patient reports independent with ADLs, mobility and driving. Admitted for above and limited by problem list below, including impaired cognition, impaired balance and decreased activity tolerance. Patient currently requires min assist for toilet transfers, min guard for in room mobility using RW, min assist for LB ADLs and setup for UB ADLs.  She requires increased time for processing, presents with poor problem solving and decreased recall during session. Believe she will benefit from continued OT services while admitted and after dc at [facility] level, given 24/7 support, in order to return back to PLOF independent level with ADLs and IADLs. Will follow.     Follow Up Recommendations  Home health OT;Supervision/Assistance - 24 hour    Equipment Recommendations  3 in 1 bedside commode    Recommendations for Other Services       Precautions / Restrictions Precautions Precautions: Fall Restrictions Weight Bearing Restrictions: No      Mobility Bed Mobility Overal bed mobility: Needs Assistance Bed Mobility: Supine to Sit     Supine to sit: Min assist     General bed mobility comments: OOB upon entry in recliner   Transfers Overall transfer level: Needs assistance Equipment used: Rolling walker (2 wheeled) Transfers: Sit to/from Stand Sit to Stand: Min guard;Min assist         General transfer comment: min guard from recliner, min assist from commode    Balance Overall balance assessment: Needs assistance Sitting-balance support: No upper  extremity supported;Feet supported Sitting balance-Leahy Scale: Good     Standing balance support: Bilateral upper extremity supported;During functional activity Standing balance-Leahy Scale: Poor Standing balance comment: reliant on BUE support dynamically                            ADL either performed or assessed with clinical judgement   ADL Overall ADL's : Needs assistance/impaired     Grooming: Supervision/safety;Sitting   Upper Body Bathing: Set up;Sitting   Lower Body Bathing: Minimal assistance;Sit to/from stand   Upper Body Dressing : Set up;Sitting   Lower Body Dressing: Minimal assistance;Sit to/from stand   Toilet Transfer: Minimal assistance;Ambulation;RW;Comfort height toilet;Grab bars   Toileting- Clothing Manipulation and Hygiene: Sit to/from stand;Min guard       Functional mobility during ADLs: Min guard;Rolling walker;Cueing for safety General ADL Comments: pt limited by cognition, weakness and balance     Vision   Vision Assessment?: No apparent visual deficits     Perception     Praxis      Pertinent Vitals/Pain Pain Assessment: Faces Faces Pain Scale: Hurts a little bit Pain Location: generalized Pain Descriptors / Indicators: Grimacing;Discomfort Pain Intervention(s): Monitored during session;Repositioned     Hand Dominance     Extremity/Trunk Assessment Upper Extremity Assessment Upper Extremity Assessment: Generalized weakness   Lower Extremity Assessment Lower Extremity Assessment: Defer to PT evaluation   Cervical / Trunk Assessment Cervical / Trunk Assessment: Kyphotic   Communication Communication Communication: No difficulties   Cognition Arousal/Alertness: Awake/alert Behavior During Therapy: WFL for tasks assessed/performed Overall Cognitive Status: Impaired/Different from baseline Area of  Impairment: Memory;Safety/judgement;Problem solving                     Memory: Decreased short-term  memory   Safety/Judgement: Decreased awareness of safety   Problem Solving: Slow processing;Difficulty sequencing;Requires verbal cues General Comments: patient with decreased recall, slow processing and difficulty problem solving    General Comments  max HR 102. Mild SOB after ambulation, which pt relates to having to wear mask in hallway.    Exercises     Shoulder Instructions      Home Living Family/patient expects to be discharged to:: Private residence Living Arrangements: Spouse/significant other;Children Available Help at Discharge: Family;Available 24 hours/day Type of Home: House Home Access: Stairs to enter [employer] of Steps: 1 Entrance Stairs-Rails: None Home Layout: Two level;Laundry or work area in basement     [employer] Shower/Tub: Teacher, early years/pre: [place]: Environmental consultant - 2 wheels          Prior Functioning/Environment Level of Independence: Independent        Comments: reports independnet ADLs, IADLs and driving         OT Problem List: Decreased activity tolerance;Impaired balance (sitting and/or standing);Decreased cognition;Decreased safety awareness;Decreased knowledge of use of DME or AE      OT Treatment/Interventions: Self-care/ADL training;DME and/or AE instruction;Cognitive remediation/compensation;Therapeutic activities;Patient/family education;Balance training;Therapeutic exercise    OT Goals(Current goals can be found in the care plan section) Acute Rehab OT Goals Patient Stated Goal: feel better OT Goal Formulation: With patient Time For Goal Achievement: 03/20/20 Potential to Achieve Goals: Good  OT Frequency: Min 2X/week   Barriers to D/C:            Co-evaluation              AM-PAC OT "6 Clicks" Daily Activity     Outcome Measure Help from another person eating meals?: None Help from another person taking care of personal grooming?: A Little Help from another person  toileting, which includes using toliet, bedpan, or urinal?: A Little Help from another person bathing (including washing, rinsing, drying)?: A Little Help from another person to put on and taking off regular upper body clothing?: None Help from another person to put on and taking off regular lower body clothing?: A Little 6 Click Score: 20   End of Session Equipment Utilized During Treatment: Rolling walker Nurse Communication: Mobility status  Activity Tolerance: Patient tolerated treatment well Patient left: in chair;with call bell/phone within reach;with chair alarm set  OT Visit Diagnosis: Other abnormalities of gait and mobility (R26.89);Muscle weakness (generalized) (M62.81);Other symptoms and signs involving cognitive function                Time: 0071-2197 OT Time Calculation (min): 32 min Charges:  OT General Charges $OT Visit: 1 Visit OT Evaluation $OT Eval Moderate Complexity: 1 Mod OT Treatments $Self Care/Home Management : 8-22 mins  Jolaine Artist, OT Acute Rehabilitation Services Pager [phone] Office [phone]    Delight Stare 03/06/2020, 11:34 AM

## 2020-03-07 ENCOUNTER — Telehealth: Payer: Self-pay | Admitting: Internal Medicine

## 2020-03-07 ENCOUNTER — Telehealth: Payer: Self-pay | Admitting: Family Medicine

## 2020-03-07 NOTE — Telephone Encounter (Signed)
Transition Care Management Follow-up Telephone Call  Date of discharge and from where: 03/06/2020 from [facility]   How have you been since you were released from the hospital? Has had a lack of strength since discharged. Has to take a break in between activities.  Any questions or concerns? No  Items Reviewed:  Did the pt receive and understand the discharge instructions provided? Yes   Medications obtained and verified? Yes   Any new allergies since your discharge? No   Dietary orders reviewed? Yes  Do you have support at home? Yes , husband lives in the home with patient   Functional Questionnaire: (I = Independent and D = Dependent) ADLs: I  Bathing/Dressing- I  Meal Prep- I  Eating- I  Maintaining continence- I  Transferring/Ambulation- I  Managing Meds- I  Follow up appointments reviewed:   PCP Hospital f/u appt confirmed? Yes  Scheduled to see Dr. Sarajane Jews on 03/15/2020 @ 10:30 am .  [facility] f/u appt confirmed? No  .  Are transportation arrangements needed? No   If their condition worsens, is the pt aware to call PCP or go to the Emergency Dept.? Yes  Was the patient provided with contact information for the PCP's office or ED? Yes  Was to pt encouraged to call back with questions or concerns? Yes

## 2020-03-07 NOTE — Telephone Encounter (Signed)
On call 8/19 and received call critcal lab calcium 14.3 from access RN the day before at 5:30 pm drawn  Tried to call pt 2x with acesss nurse unsuccessful and they will keep reaching out to patient and rec she go to ED critical Ca needs to be managed in the hospital and left list of sxs and why for when they reach patient   Dr. Kelly Services

## 2020-03-10 ENCOUNTER — Telehealth: Payer: Self-pay | Admitting: Family Medicine

## 2020-03-10 NOTE — Telephone Encounter (Signed)
Pt wants a call

## 2020-03-11 NOTE — Telephone Encounter (Signed)
Left message for patient to call back. Please see what questions the patient has when she calls back.

## 2020-03-14 ENCOUNTER — Other Ambulatory Visit: Payer: Self-pay

## 2020-03-14 ENCOUNTER — Ambulatory Visit: Payer: Medicare HMO | Admitting: Neurology

## 2020-03-15 ENCOUNTER — Encounter: Payer: Self-pay | Admitting: Family Medicine

## 2020-03-15 ENCOUNTER — Ambulatory Visit (INDEPENDENT_AMBULATORY_CARE_PROVIDER_SITE_OTHER): Payer: Medicare HMO | Admitting: Family Medicine

## 2020-03-15 DIAGNOSIS — J359 Chronic disease of tonsils and adenoids, unspecified: Secondary | ICD-10-CM

## 2020-03-15 DIAGNOSIS — I10 Essential (primary) hypertension: Secondary | ICD-10-CM | POA: Diagnosis not present

## 2020-03-15 DIAGNOSIS — N179 Acute kidney failure, unspecified: Secondary | ICD-10-CM

## 2020-03-15 NOTE — Progress Notes (Signed)
   Subjective:    Patient ID: [name], female    DOB: 1945-11-04, 74 y.o.   MRN: [id number]  HPI Here with her husband to follow up on a hospital stay from 03-03-20 to 03-06-20 for a serum calcium level of 14.3. On the day before her admission she presented to my office complaining of confusion, memory problems, difficulty walking, and falling. On my exam she was quite confused and it was difficult to have a conversation with her. We ordered labs that day where the calcium was found to be high. Her PTH was slightly below normal. Her creatinine was also high at 1.55.  She was told to go to the ER, where a brain MRI was normal except a 6 mm hyperintense lesion was seen in the right palatine tonsil. She was treated with IV fluids, and her vitamin D supplement and her HCTZ were stopped. Her calcium came down to 11.2 at DC , and her creatinine came down to 1.12. She was sent home on Amlodipine 5 mg daily for her BP, and she was told to take 1/2 a tablet (10 mg) of Enalapril daily. Since getting home she has felt better with improved mobility and her cognition is back to baseline. Her BP has been stable.    Review of Systems  Constitutional: Positive for fatigue.  Respiratory: Negative.   Cardiovascular: Negative.   Neurological: Negative.        Objective:   Physical Exam Constitutional:      Appearance: Normal appearance.     Comments: She looks to be back to baseline, and her speech and cognition are normal   Cardiovascular:     Rate and Rhythm: Normal rate and regular rhythm.     Pulses: Normal pulses.     Heart sounds: Normal heart sounds.  Pulmonary:     Effort: Pulmonary effort is normal.     Breath sounds: Normal breath sounds.  Neurological:     Mental Status: She is alert.           Assessment & Plan:  Hypercalcemia, improving. She will stay off vitamin D and HCTZ. We will check a BMET today to recheck a calcium and her renal function. For the HTN she will stay on the  current regimen. She has a pending appt with Neurology. We will refer her to ENT about the tonsillar lesion. Alysia Penna, MD

## 2020-03-16 LAB — BASIC METABOLIC PANEL
BUN/Creatinine Ratio: 19 (calc) (ref 6–22)
BUN: 20 mg/dL (ref 7–25)
CO2: 25 mmol/L (ref 20–32)
Calcium: 10.5 mg/dL — ABNORMAL HIGH (ref 8.6–10.4)
Chloride: 102 mmol/L (ref 98–110)
Creat: 1.04 mg/dL — ABNORMAL HIGH (ref 0.60–0.93)
Glucose, Bld: 82 mg/dL (ref 65–99)
Potassium: 4.1 mmol/L (ref 3.5–5.3)
Sodium: 138 mmol/L (ref 135–146)

## 2020-03-20 LAB — PTH-RELATED PEPTIDE: PTH-related peptide: 2.4 pmol/L

## 2020-04-01 ENCOUNTER — Encounter: Payer: Self-pay | Admitting: Family Medicine

## 2020-04-01 ENCOUNTER — Other Ambulatory Visit: Payer: Self-pay

## 2020-04-01 ENCOUNTER — Ambulatory Visit (INDEPENDENT_AMBULATORY_CARE_PROVIDER_SITE_OTHER): Payer: Medicare HMO | Admitting: Family Medicine

## 2020-04-01 VITALS — BP 150/74 | HR 88 | Temp 97.9°F | Ht 62.0 in | Wt 130.2 lb

## 2020-04-01 DIAGNOSIS — D519 Vitamin B12 deficiency anemia, unspecified: Secondary | ICD-10-CM | POA: Diagnosis not present

## 2020-04-01 DIAGNOSIS — E538 Deficiency of other specified B group vitamins: Secondary | ICD-10-CM | POA: Diagnosis not present

## 2020-04-01 DIAGNOSIS — I10 Essential (primary) hypertension: Secondary | ICD-10-CM | POA: Diagnosis not present

## 2020-04-01 DIAGNOSIS — N182 Chronic kidney disease, stage 2 (mild): Secondary | ICD-10-CM

## 2020-04-01 DIAGNOSIS — R413 Other amnesia: Secondary | ICD-10-CM

## 2020-04-01 DIAGNOSIS — R5381 Other malaise: Secondary | ICD-10-CM | POA: Diagnosis not present

## 2020-04-01 NOTE — Progress Notes (Signed)
   Subjective:    Patient ID: [name], female    DOB: [birth year], 74 y.o.   MRN: [id number]  HPI Here to follow up on HTN and hypercalcemia. She is doing better but she does complain of some general fatigue and weight loss. Her appetite is poor, and she has lost 30 lbs over the past 18 months. She feels unsteady on her feet, and she wants to get stronger.    Review of Systems  Constitutional: Positive for fatigue and unexpected weight change.  Respiratory: Negative.   Cardiovascular: Negative.   Gastrointestinal: Negative.   Genitourinary: Negative.   Neurological: Positive for weakness.  Psychiatric/Behavioral: Negative.        Objective:   Physical Exam Constitutional:      Appearance: Normal appearance.  Cardiovascular:     Rate and Rhythm: Normal rate and regular rhythm.     Pulses: Normal pulses.     Heart sounds: Normal heart sounds.  Pulmonary:     Effort: Pulmonary effort is normal.     Breath sounds: Normal breath sounds.  Neurological:     General: No focal deficit present.     Mental Status: She is alert and oriented to person, place, and time.           Assessment & Plan:  Her HTN is stable and her memory seems to be stable. We will get labs today to check a BMET. Including calcium. To address her nutrition, we will refer her to Nutrition. To help with deconditioning, refer to PT.  Alysia Penna, MD

## 2020-04-02 LAB — BASIC METABOLIC PANEL
BUN: 22 mg/dL (ref 7–25)
CO2: 24 mmol/L (ref 20–32)
Calcium: 10 mg/dL (ref 8.6–10.4)
Chloride: 103 mmol/L (ref 98–110)
Creat: 0.93 mg/dL (ref 0.60–0.93)
Glucose, Bld: 106 mg/dL — ABNORMAL HIGH (ref 65–99)
Potassium: 4 mmol/L (ref 3.5–5.3)
Sodium: 138 mmol/L (ref 135–146)

## 2020-04-02 LAB — VITAMIN B12: Vitamin B-12: 397 pg/mL (ref 200–1100)

## 2020-04-04 ENCOUNTER — Other Ambulatory Visit: Payer: Self-pay | Admitting: Family Medicine

## 2020-04-04 MED ORDER — AMLODIPINE BESYLATE 5 MG PO TABS: 5.0000 mg | ORAL_TABLET | Freq: Every day | ORAL | 3 refills | Status: DC

## 2020-04-04 NOTE — Telephone Encounter (Signed)
Rx sent in

## 2020-04-04 NOTE — Telephone Encounter (Signed)
Pt call and need a refill on amLODipine (NORVASC) 5 MG t she stated that she only have 2 pill left and want it call in to  [id number] - [place], [place] [address] [place] Phone:  [phone]  Fax:  [phone]

## 2020-04-08 ENCOUNTER — Ambulatory Visit: Payer: Medicare HMO | Admitting: Neurology

## 2020-04-08 ENCOUNTER — Other Ambulatory Visit: Payer: Self-pay

## 2020-04-08 ENCOUNTER — Encounter: Payer: Self-pay | Admitting: Neurology

## 2020-04-08 VITALS — BP 160/84 | HR 80 | Ht 62.0 in | Wt 132.0 lb

## 2020-04-08 DIAGNOSIS — G9341 Metabolic encephalopathy: Secondary | ICD-10-CM

## 2020-04-08 NOTE — Patient Instructions (Signed)
If you choose to proceed with memory testing, call my office Recommend that you consider seeing a counselor
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
## 2020-04-11 ENCOUNTER — Encounter: Payer: Self-pay | Admitting: Family Medicine

## 2020-04-14 ENCOUNTER — Telehealth: Payer: Self-pay | Admitting: Family Medicine

## 2020-04-14 NOTE — Telephone Encounter (Signed)
Pt would like a call back regarding lab results. 

## 2020-04-14 NOTE — Telephone Encounter (Signed)
Pt called the Health [facility] Nutrition that she was referred to and they stated that her insurance Scientist, clinical (histocompatibility and immunogenetics)) will not cover her visit the only way that her insurance will cover her is that if she had renal failure or diabetes and she is going to check to see if her PT is going to be covered by her insurance. Pt also mentioned that she would like to get her lab results gone over with her by someone due to the glucose being high and wanted to know what Dr. Sarajane Jews wanted to do and to explain to her the labs from the hospital.

## 2020-04-14 NOTE — Telephone Encounter (Signed)
Please advise about the health and nutrition referral.   Patient was called and discussed her lab results in another TE.

## 2020-04-14 NOTE — Telephone Encounter (Signed)
Patient called and says asked about the glucose and the creatinine. I advised that Dr. Sarajane Jews said both were within normal range. She asked will she need them to be rechecked, I advised he did not indicate the need for rechecking, she verbalized understanding.

## 2020-04-15 NOTE — Telephone Encounter (Signed)
Noted  

## 2020-04-15 NOTE — Telephone Encounter (Signed)
Then I suppose she can't see Nutrition

## 2020-04-26 ENCOUNTER — Telehealth: Payer: Self-pay | Admitting: Family Medicine

## 2020-04-26 NOTE — Telephone Encounter (Signed)
predniSONE (DELTASONE) 10 MG tablet   [id number] - [place], [place] [address] Phone:  [phone]  Fax:  [phone]      The patient would like for Dr. Sarajane Jews to call in this medication for her symptoms of PMR.  The patient would also like a call back to let her know if Dr. Sarajane Jews is able to call in the Rx for her.  Please advise

## 2020-04-28 MED ORDER — PREDNISONE 10 MG PO TABS: 10.0000 mg | ORAL_TABLET | Freq: Every day | ORAL | 1 refills | Status: DC

## 2020-04-28 NOTE — Telephone Encounter (Signed)
Patient called, left VM on home phone that [facility] to [facility].

## 2020-04-28 NOTE — Telephone Encounter (Signed)
Call in Prednisone 10 mg daily, #90 with one rf

## 2020-04-28 NOTE — Addendum Note (Signed)
Addended by: Matilde Sprang on: 04/28/2020 09:01 AM   Modules accepted: Orders

## 2020-05-02 DIAGNOSIS — J359 Chronic disease of tonsils and adenoids, unspecified: Secondary | ICD-10-CM | POA: Diagnosis not present

## 2020-05-13 ENCOUNTER — Ambulatory Visit: Payer: Medicare HMO | Admitting: Neurology

## 2020-05-20 ENCOUNTER — Telehealth: Payer: Self-pay | Admitting: Family Medicine

## 2020-05-20 NOTE — Telephone Encounter (Signed)
Left message for patient to schedule Annual Wellness Visit.  Please schedule with Nurse Health Advisor Shannon Crews, RN at [facility]  

## 2020-05-20 NOTE — Telephone Encounter (Signed)
Left message for patient to schedule Annual Wellness Visit.  Please schedule with Nurse Health Advisor Shannon Crews, RN at [facility]  

## 2020-05-24 ENCOUNTER — Ambulatory Visit: Payer: Medicare HMO | Attending: Family Medicine

## 2020-05-24 ENCOUNTER — Other Ambulatory Visit: Payer: Self-pay

## 2020-05-24 DIAGNOSIS — M6281 Muscle weakness (generalized): Secondary | ICD-10-CM

## 2020-05-24 DIAGNOSIS — R2689 Other abnormalities of gait and mobility: Secondary | ICD-10-CM

## 2020-05-24 NOTE — Patient Instructions (Signed)
Access Code: [email or web address] URL: [email or web address] Date: 05/24/2020 Prepared by: Claiborne Billings  Exercises Seated Long Arc Quad - 3 x daily - 7 x weekly - 2 sets - 10 reps - 5 hold Seated March - 3 x daily - 7 x weekly - 3 sets - 10 reps Seated Heel Toe Raises - 3 x daily - 7 x weekly - 2 sets - 10 reps Sit to Stand - 2 x daily - 7 x weekly - 2 sets - 5 reps Seated Gripping Towel - 3 x daily - 7 x weekly - 3 sets - 10 reps

## 2020-05-24 NOTE — Therapy (Signed)
[facility] [address], [place], [place], [address] Phone: [phone]   Fax:  [phone]  Physical Therapy Evaluation  Patient Details  Name: [name] MRN: [id number] Date of Birth: [birth year] Referring Provider (PT): Alysia Penna, MD   Encounter Date: 05/24/2020   PT End of Session - 05/24/20 1014    Visit Number 1    Date for PT Re-Evaluation 08/16/20    Authorization Type Aetna/Medicare    Progress Note Due on Visit 10    PT Start Time 0933    PT Stop Time 1018    PT Time Calculation (min) 45 min    Activity Tolerance Patient tolerated treatment well    Behavior During Therapy [facility] for tasks assessed/performed           Past Medical History:  Diagnosis Date   Backache, unspecified    Hemiplegia, unspecified, affecting unspecified side    Lacunar infarction ([place])    Other and unspecified hyperlipidemia    Polymyalgia ([place])    Retinal defect, unspecified    Rotator cuff injury    right shoulder   Unspecified essential hypertension    Venous tributary (branch) occlusion of retina     Past Surgical History:  Procedure Laterality Date   ABDOMINAL HYSTERECTOMY     CHOLECYSTECTOMY     COLONOSCOPY  01-11-10   per Dr. Laurence Spates, internal hemorrhoids only, repeat in 10 yrs    SPHINCTEROTOMY      There were no vitals filed for this visit.    Subjective Assessment - 05/24/20 0936    Subjective Pt was diagnosed with polymyalgia rheumatica 2 years ago and has had slow progression of weakness, endurance and gait deficits.  Pt intermittently uses a walker for stability.    Pertinent History polymyalgia rheumatica    Limitations Standing;Walking    How long can you walk comfortably? 5-7 minutes    Patient Stated Goals improve endurance, reduce falls    Currently in Pain? No/denies              [facility] PT Assessment - 05/24/20 0001      Assessment   Medical Diagnosis physical  deconditioning    Referring Provider (PT) Alysia Penna, MD    Onset Date/Surgical Date 05/24/18    Next MD Visit 05/30/20    Prior Therapy none       Precautions   Precautions Fall      Balance Screen   Has the patient fallen in the past 6 months Yes    How many times? 2   PT will work on balance    Has the patient had a decrease in activity level because of a fear of falling?  Yes    Is the patient reluctant to leave their home because of a fear of falling?  Yes      Prior Function   Level of Independence Independent    Vocation Retired    Leisure used to do [employer]   Overall Cognitive Status Within Functional Limits for tasks assessed      Posture/Postural Control   Posture/Postural Control Postural limitations    Postural Limitations Forward head;Rounded Shoulders      ROM / Strength   AROM / PROM / Strength AROM;PROM;Strength      Strength   Overall Strength Deficits    Overall Strength Comments Bil hips 4-/5, knees 4+/5, ankles 4/5, Lt shoulder 4-/5, Rt shoulder 4/5  Strength Assessment Site Hand    Right/Left hand Right;Left    Right Hand Grip (lbs) 19    Left Hand Grip (lbs) 19      Transfers   Transfers Sit to Stand;Stand to Sit    Sit to Stand With upper extremity assist    Five time sit to stand comments  20.5 seconds     Stand to Sit With upper extremity assist      Ambulation/Gait   Ambulation/Gait Yes    Gait Pattern Step-through pattern;Decreased stride length;Lateral hip instability;Antalgic;Wide base of support    Gait Comments 3 minute walk test: 414 feet                      Objective measurements completed on examination: See above findings.               PT Education - 05/24/20 1012    Education Details Access Code: [email or web address]    Person(s) Educated Patient    Methods Explanation;Demonstration;Handout    Comprehension Verbalized understanding;Returned demonstration            PT Short Term Goals  - 05/24/20 0942      PT SHORT TERM GOAL #1   Title be independent in intial HEP    Time 6    Period Weeks    Status New    Target Date 07/05/20      PT SHORT TERM GOAL #2   Title improve endurance to stand and walk for 10-12 minutes without rest    Time 6    Period Weeks    Status New    Target Date 07/05/20      PT SHORT TERM GOAL #3   Title perform 5x sit to stand in < or = to 17 seconds to improve balance and safety    Time 6    Period Weeks    Status New    Target Date 07/05/20      PT SHORT TERM GOAL #4   Title demonstrate > or = to 25# bil grip strength to improve ability to grip and hold objects at home    Time 6    Period Weeks    Status New    Target Date 07/05/20             PT Long Term Goals - 05/24/20 0942      PT LONG TERM GOAL #1   Title be independent in advanced HEP    Time 12    Period Weeks    Status New    Target Date 08/16/20      PT LONG TERM GOAL #2   Title walk 550 feet in 3 minutes to improve endurance for counity function    Time 12    Status New    Target Date 08/16/20      PT LONG TERM GOAL #3   Title perform 5x sit to stand in < or = to 14 seconds to improve balance and reduce falls risk    Time 12    Period Weeks    Status New    Target Date 08/16/20      PT LONG TERM GOAL #4   Title demonstrate > or = to 30# bil grip strength to improve ability to grip and hold objects at home    Time 12    Period Weeks    Status New    Target Date 08/16/20  Plan - 05/24/20 1018    Clinical Impression Statement Pt presents to PT with physical deconditioning that has progressed as result of polymyalgia rheumatica diagnosed 2 years ago.  Pt has had 1 fall in the past 6 months and intermittently uses a walker for balance.  Pt reports significant limitation in endurance and is limited to 5-7 minutes without device and up to 20-30 minutes with use of cart in grocery store with fatigue after this with need for a rest  break.  Pt walked 414 feet in 3 minutes and demonstrates antalgic gait with wide base of support and lateral hip instability with reduced trunk rotation.  Pt performed 5x sit to stand in 20.5 seconds indicating a falls risk.  Bil grip strength is reduced bilaterally at 19# each.  Pt would like to attend PT 3-4 times to build HEP due to co-pay concerns.  Pt will benefit from skilled PT to address endurance, strength, grip strength and balance    Personal Factors and Comorbidities Age;Comorbidity 2    Comorbidities polymyalgia rheumatica, HTN    Examination-Activity Limitations Carry;Lift;Stand;Stairs;Squat;Locomotion Level;Transfers    Examination-Participation Restrictions Community Activity;Yard Work;Shop;Meal Prep    Stability/Clinical Decision Making Evolving/Moderate complexity    Clinical Decision Making Moderate    Rehab Potential Excellent    PT Frequency 1x / week    PT Duration 12 weeks    PT Treatment/Interventions ADLs/Self Care Home Management;Cryotherapy;Moist Heat;Neuromuscular re-education;Balance training;Therapeutic exercise;Therapeutic activities;Gait training;Stair training;Functional mobility training;Patient/family education;Manual techniques;Taping    PT Next Visit Plan Pt will have limited visits due to co-pay concerns.  Advance HEP for UE and LE strength, balance and endurance    PT Home Exercise Plan Access Code: [email or web address]    Consulted and Agree with Plan of Care Patient           Patient will benefit from skilled therapeutic intervention in order to improve the following deficits and impairments:  Abnormal gait, Decreased activity tolerance, Decreased balance, Postural dysfunction, Decreased strength, Improper body mechanics, Pain, Decreased endurance, Difficulty walking, Decreased range of motion  Visit Diagnosis: Muscle weakness (generalized) - Plan: PT plan of care cert/re-cert  Other abnormalities of gait and mobility - Plan: PT plan of care  cert/re-cert     Problem List Patient Active Problem List   Diagnosis Date Noted   Lesion of tonsil 03/15/2020   AKI (acute kidney injury) ([place]) 03/06/2020   CKD stage G2/A2, GFR 60-89 and albumin creatinine ratio 30-299 mg/g 03/06/2020   Hypomagnesemia 03/06/2020   Polymyalgia rheumatica ([place]) 03/06/2020   Hypercalcemia 03/04/2020   Confusion 03/02/2020   Memory loss 03/02/2020   B12 deficiency 01/09/2019   Vertigo 10/31/2018   Hypokalemia 10/31/2018   Chest pain 10/30/2018   Carpal tunnel syndrome, bilateral 08/11/2018   OTHER AND UNSPECIFIED HYPERLIPIDEMIA 11/22/2009   LACUNAR INFARCTION 11/22/2009   BACK PAIN, CHRONIC 11/22/2009   UNSPECIFIED RETINAL DEFECT 11/08/2009   ROTATOR CUFF INJURY, RIGHT SHOULDER 11/08/2009   BRANCH RETINAL VEIN OCCLUSION 08/12/2007   Essential hypertension 08/12/2007     Sigurd Sos, PT 05/24/20 11:26 AM  [facility] [address], [place], [place], [address] Phone: [phone]   Fax:  [phone]  Name: [name] MRN: [id number] Date of Birth: [birth year]

## 2020-05-30 ENCOUNTER — Ambulatory Visit (INDEPENDENT_AMBULATORY_CARE_PROVIDER_SITE_OTHER): Payer: Medicare HMO

## 2020-05-30 ENCOUNTER — Ambulatory Visit: Payer: Medicare HMO | Admitting: Physical Therapy

## 2020-05-30 VITALS — BP 179/99

## 2020-05-30 DIAGNOSIS — Z0000 Encounter for general adult medical examination without abnormal findings: Secondary | ICD-10-CM | POA: Diagnosis not present

## 2020-05-30 NOTE — Patient Instructions (Signed)
[name] , Thank you for taking time to come for your Medicare Wellness Visit. I appreciate your ongoing commitment to your health goals. Please review the following plan we discussed and let me know if I can assist you in the future.   Screening recommendations/referrals: Colonoscopy: Currently due, orders placed this visit  Mammogram: Currently due orders placed this visit. Bone Density: No longer required  Recommended yearly ophthalmology/optometry visit for glaucoma screening and checkup Recommended yearly dental visit for hygiene and checkup  Vaccinations: Influenza vaccine: Currently due, you may receive in our office or at your local pharmacy  Pneumococcal vaccine: Currently due for Pneumovax 23, you may receive at your next in person office visit  Tdap vaccine: Currently due, you may receive at your next in person office visit  Shingles vaccine: Completed series    Advanced directives: Advance directive discussed with you today. Even though you declined this today please call our office should you change your mind and we can give you the proper paperwork for you to fill out.   Conditions/risks identified: None   Next appointment: 06/01/2020 @ 2:45 PM with Dr. Sarajane Jews   Preventive Care 65 Years and Older, Female Preventive care refers to lifestyle choices and visits with your health care provider that can promote health and wellness. What does preventive care include?  A yearly physical exam. This is also called an annual well check.  Dental exams once or twice a year.  Routine eye exams. Ask your health care provider how often you should have your eyes checked.  Personal lifestyle choices, including:  Daily care of your teeth and gums.  Regular physical activity.  Eating a healthy diet.  Avoiding tobacco and drug use.  Limiting alcohol use.  Practicing safe sex.  Taking low-dose aspirin every day.  Taking vitamin and mineral supplements as recommended by your  health care provider. What happens during an annual well check? The services and screenings done by your health care provider during your annual well check will depend on your age, overall health, lifestyle risk factors, and family history of disease. Counseling  Your health care provider may ask you questions about your:  Alcohol use.  Tobacco use.  Drug use.  Emotional well-being.  Home and relationship well-being.  Sexual activity.  Eating habits.  History of falls.  Memory and ability to understand (cognition).  Work and work Statistician.  Reproductive health. Screening  You may have the following tests or measurements:  Height, weight, and BMI.  Blood pressure.  Lipid and cholesterol levels. These may be checked every 5 years, or more frequently if you are over 28 years old.  Skin check.  Lung cancer screening. You may have this screening every year starting at age 39 if you have a 30-pack-year history of smoking and currently smoke or have quit within the past 15 years.  Fecal occult blood test (FOBT) of the stool. You may have this test every year starting at age 48.  Flexible sigmoidoscopy or colonoscopy. You may have a sigmoidoscopy every 5 years or a colonoscopy every 10 years starting at age 40.  Hepatitis C blood test.  Hepatitis B blood test.  Sexually transmitted disease (STD) testing.  Diabetes screening. This is done by checking your blood sugar (glucose) after you have not eaten for a while (fasting). You may have this done every 1-3 years.  Bone density scan. This is done to screen for osteoporosis. You may have this done starting at age 20.  Mammogram.  This may be done every 1-2 years. Talk to your health care provider about how often you should have regular mammograms. Talk with your health care provider about your test results, treatment options, and if necessary, the need for more tests. Vaccines  Your health care provider may recommend  certain vaccines, such as:  Influenza vaccine. This is recommended every year.  Tetanus, diphtheria, and acellular pertussis (Tdap, Td) vaccine. You may need a Td booster every 10 years.  Zoster vaccine. You may need this after age 67.  Pneumococcal 13-valent conjugate (PCV13) vaccine. One dose is recommended after age 51.  Pneumococcal polysaccharide (PPSV23) vaccine. One dose is recommended after age 60. Talk to your health care provider about which screenings and vaccines you need and how often you need them. This information is not intended to replace advice given to you by your health care provider. Make sure you discuss any questions you have with your health care provider. Document Released: 07/29/2015 Document Revised: 03/21/2016 Document Reviewed: 05/03/2015 Elsevier Interactive Patient Education  2017 [employer] Prevention in the Home Falls can cause injuries. They can happen to people of all ages. There are many things you can do to make your home safe and to help prevent falls. What can I do on the outside of my home?  Regularly fix the edges of walkways and driveways and fix any cracks.  Remove anything that might make you trip as you walk through a door, such as a raised step or threshold.  Trim any bushes or trees on the path to your home.  Use bright outdoor lighting.  Clear any walking paths of anything that might make someone trip, such as rocks or tools.  Regularly check to see if handrails are loose or broken. Make sure that both sides of any steps have handrails.  Any raised decks and porches should have guardrails on the edges.  Have any leaves, snow, or ice cleared regularly.  Use sand or salt on walking paths during winter.  Clean up any spills in your garage right away. This includes oil or grease spills. What can I do in the bathroom?  Use night lights.  Install grab bars by the toilet and in the tub and shower. Do not use towel bars as  grab bars.  Use non-skid mats or decals in the tub or shower.  If you need to sit down in the shower, use a plastic, non-slip stool.  Keep the floor dry. Clean up any water that spills on the floor as soon as it happens.  Remove soap buildup in the tub or shower regularly.  Attach bath mats securely with double-sided non-slip rug tape.  Do not have throw rugs and other things on the floor that can make you trip. What can I do in the bedroom?  Use night lights.  Make sure that you have a light by your bed that is easy to reach.  Do not use any sheets or blankets that are too big for your bed. They should not hang down onto the floor.  Have a firm chair that has side arms. You can use this for support while you get dressed.  Do not have throw rugs and other things on the floor that can make you trip. What can I do in the kitchen?  Clean up any spills right away.  Avoid walking on wet floors.  Keep items that you use a lot in easy-to-reach places.  If you need to reach something  above you, use a strong step stool that has a grab bar.  Keep electrical cords out of the way.  Do not use floor polish or wax that makes floors slippery. If you must use wax, use non-skid floor wax.  Do not have throw rugs and other things on the floor that can make you trip. What can I do with my stairs?  Do not leave any items on the stairs.  Make sure that there are handrails on both sides of the stairs and use them. Fix handrails that are broken or loose. Make sure that handrails are as long as the stairways.  Check any carpeting to make sure that it is firmly attached to the stairs. Fix any carpet that is loose or worn.  Avoid having throw rugs at the top or bottom of the stairs. If you do have throw rugs, attach them to the floor with carpet tape.  Make sure that you have a light switch at the top of the stairs and the bottom of the stairs. If you do not have them, ask someone to add them  for you. What else can I do to help prevent falls?  Wear shoes that:  Do not have high heels.  Have rubber bottoms.  Are comfortable and fit you well.  Are closed at the toe. Do not wear sandals.  If you use a stepladder:  Make sure that it is fully opened. Do not climb a closed stepladder.  Make sure that both sides of the stepladder are locked into place.  Ask someone to hold it for you, if possible.  Clearly mark and make sure that you can see:  Any grab bars or handrails.  First and last steps.  Where the edge of each step is.  Use tools that help you move around (mobility aids) if they are needed. These include:  Canes.  Walkers.  Scooters.  Crutches.  Turn on the lights when you go into a dark area. Replace any light bulbs as soon as they burn out.  Set up your furniture so you have a clear path. Avoid moving your furniture around.  If any of your floors are uneven, fix them.  If there are any pets around you, be aware of where they are.  Review your medicines with your doctor. Some medicines can make you feel dizzy. This can increase your chance of falling. Ask your doctor what other things that you can do to help prevent falls. This information is not intended to replace advice given to you by your health care provider. Make sure you discuss any questions you have with your health care provider. Document Released: 04/28/2009 Document Revised: 12/08/2015 Document Reviewed: 08/06/2014 Elsevier Interactive Patient Education  2017 [employer].
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
## 2020-05-31 ENCOUNTER — Ambulatory Visit: Payer: Medicare HMO

## 2020-05-31 ENCOUNTER — Other Ambulatory Visit: Payer: Self-pay

## 2020-05-31 DIAGNOSIS — M6281 Muscle weakness (generalized): Secondary | ICD-10-CM | POA: Diagnosis not present

## 2020-05-31 DIAGNOSIS — R2689 Other abnormalities of gait and mobility: Secondary | ICD-10-CM

## 2020-05-31 NOTE — Therapy (Addendum)
[facility] [address] [place], [place], [address] Phone: [phone]   Fax:  [phone]  Physical Therapy Treatment  Patient Details  Name: [name] MRN: [id number] Date of Birth: [birth year] Referring Provider (PT): Alysia Penna, MD   Encounter Date: 05/31/2020   PT End of Session - 05/31/20 1144    Visit Number 2    Date for PT Re-Evaluation 08/16/20    Authorization Type [employer]na/Medicare    Progress Note Due on Visit 10    PT Start Time 1101    PT Stop Time 1147    PT Time Calculation (min) 46 min    Activity Tolerance Patient tolerated treatment well    Behavior During Therapy [facility] for tasks assessed/performed           Past Medical History:  Diagnosis Date  . Backache, unspecified   . Hemiplegia, unspecified, affecting unspecified side   . Lacunar infarction ([place])   . Other and unspecified hyperlipidemia   . Polymyalgia ([place])   . Retinal defect, unspecified   . Rotator cuff injury    right shoulder  . Unspecified essential hypertension   . Venous tributary (branch) occlusion of retina     Past Surgical History:  Procedure Laterality Date  . ABDOMINAL HYSTERECTOMY    . CHOLECYSTECTOMY    . COLONOSCOPY  01-11-10   per Dr. Laurence Spates, internal hemorrhoids only, repeat in 10 yrs   . SPHINCTEROTOMY      There were no vitals filed for this visit.   Subjective Assessment - 05/31/20 1100    Subjective I haven't been doing my exercises.  I'm taking steroids so it is making it so I can move better.    Pertinent History polymyalgia rheumatica    Patient Stated Goals improve endurance, reduce falls    Currently in Pain? No/denies                             [facility] Adult PT Treatment/Exercise - 05/31/20 0001      Exercises   Exercises Knee/Hip;Ankle      Knee/Hip Exercises: Aerobic   Nustep Level 2x 10 minutes      Knee/Hip Exercises: Standing   Heel Raises Both;2  sets;10 reps    Hip Abduction Stengthening;Both;2 sets;10 reps      Knee/Hip Exercises: Seated   Long Arc Quad Strengthening;Both;20 reps    Marching Strengthening;Both;2 sets;10 reps    Sit to [employer] 2 sets;10 reps      Ankle Exercises: Seated   Heel Raises 20 reps    Toe Raise 20 reps    Other Seated Ankle Exercises digiflex Red: Rt and Lt x 2 minutes each                     PT Short Term Goals - 05/24/20 0942      PT SHORT TERM GOAL #1   Title be independent in intial HEP    Time 6    Period Weeks    Status New    Target Date 07/05/20      PT SHORT TERM GOAL #2   Title improve endurance to stand and walk for 10-12 minutes without rest    Time 6    Period Weeks    Status New    Target Date 07/05/20      PT SHORT TERM GOAL #3   Title perform 5x sit to  stand in < or = to 17 seconds to improve balance and safety    Time 6    Period Weeks    Status New    Target Date 07/05/20      PT SHORT TERM GOAL #4   Title demonstrate > or = to 25# bil grip strength to improve ability to grip and hold objects at home    Time 6    Period Weeks    Status New    Target Date 07/05/20             PT Long Term Goals - 05/24/20 0942      PT LONG TERM GOAL #1   Title be independent in advanced HEP    Time 12    Period Weeks    Status New    Target Date 08/16/20      PT LONG TERM GOAL #2   Title walk 550 feet in 3 minutes to improve endurance for counity function    Time 12    Status New    Target Date 08/16/20      PT LONG TERM GOAL #3   Title perform 5x sit to stand in < or = to 14 seconds to improve balance and reduce falls risk    Time 12    Period Weeks    Status New    Target Date 08/16/20      PT LONG TERM GOAL #4   Title demonstrate > or = to 30# bil grip strength to improve ability to grip and hold objects at home    Time 12    Period Weeks    Status New    Target Date 08/16/20                 Plan - 05/31/20 1109    Clinical  Impression Statement Pt with first time follow-up after evaluation.  Pt has not been doing her HEP so session was spent reviewing and PT provided education regarding the importance of compliance for strength and endurance gains.  PT monitored throughout session for technique and to monitor for fatigue and technique.  Pt will continue to benefit from skilled PT to address chronic strength and balance deficits.    Rehab Potential Excellent    PT Frequency 1x / week    PT Duration 12 weeks    PT Treatment/Interventions ADLs/Self Care Home Management;Cryotherapy;Moist Heat;Neuromuscular re-education;Balance training;Therapeutic exercise;Therapeutic activities;Gait training;Stair training;Functional mobility training;Patient/family education;Manual techniques;Taping    PT Next Visit Plan Pt will have limited visits due to co-pay concerns.  Advance HEP for UE and LE strength, balance and endurance if pt is consistent with her current HEP    PT Home Exercise Plan Access Code: [email or web address]    Recommended Other Services initial cert is signed    Consulted and Agree with Plan of Care Patient           Patient will benefit from skilled therapeutic intervention in order to improve the following deficits and impairments:  Abnormal gait, Decreased activity tolerance, Decreased balance, Postural dysfunction, Decreased strength, Improper body mechanics, Pain, Decreased endurance, Difficulty walking, Decreased range of motion  Visit Diagnosis: Muscle weakness (generalized)  Other abnormalities of gait and mobility     Problem List Patient Active Problem List   Diagnosis Date Noted  . Lesion of tonsil 03/15/2020  . AKI (acute kidney injury) ([place]) 03/06/2020  . CKD stage G2/A2, GFR 60-89 and albumin creatinine ratio 30-299 mg/g 03/06/2020  . Hypomagnesemia 03/06/2020  .  Polymyalgia rheumatica ([place]) 03/06/2020  . Hypercalcemia 03/04/2020  . Confusion 03/02/2020  . Memory loss 03/02/2020  . B12 deficiency  01/09/2019  . Vertigo 10/31/2018  . Hypokalemia 10/31/2018  . Chest pain 10/30/2018  . Carpal tunnel syndrome, bilateral 08/11/2018  . OTHER AND UNSPECIFIED HYPERLIPIDEMIA 11/22/2009  . LACUNAR INFARCTION 11/22/2009  . BACK PAIN, CHRONIC 11/22/2009  . UNSPECIFIED RETINAL DEFECT 11/08/2009  . ROTATOR CUFF INJURY, RIGHT SHOULDER 11/08/2009  . [place] OCCLUSION 08/12/2007  . Essential hypertension 08/12/2007    Sigurd Sos, PT 05/31/20 11:54 AM PHYSICAL THERAPY DISCHARGE SUMMARY  Visits from Start of Care: 2  Current functional level related to goals / functional outcomes: See above for current status.  Pt didn't return to PT.     Remaining deficits: See above for most current PT status.     Education / Equipment: HEP Plan: Patient agrees to discharge.  Patient goals were not met. Patient is being discharged due to not returning since the last visit.  ?????        Sigurd Sos, PT 08/25/20 7:35 AM    [facility] [address], [place], [place], [address] Phone: [phone]   Fax:  [phone]  Name: [name] MRN: [id number] Date of Birth: 1945-10-29

## 2020-06-01 ENCOUNTER — Other Ambulatory Visit: Payer: Self-pay | Admitting: General Practice

## 2020-06-01 ENCOUNTER — Ambulatory Visit: Payer: Medicare HMO | Admitting: Family Medicine

## 2020-06-15 ENCOUNTER — Telehealth: Payer: Self-pay | Admitting: Physical Therapy

## 2020-06-15 ENCOUNTER — Ambulatory Visit: Payer: Medicare HMO | Attending: Family Medicine | Admitting: Physical Therapy

## 2020-06-15 NOTE — Telephone Encounter (Signed)
PTA called and LM on pt VM for no show appt.  Myrene Galas, PTA @TODAY @ 3:12 PM

## 2020-06-22 DIAGNOSIS — H524 Presbyopia: Secondary | ICD-10-CM | POA: Diagnosis not present

## 2020-06-30 ENCOUNTER — Other Ambulatory Visit: Payer: Self-pay | Admitting: Family Medicine

## 2020-07-11 ENCOUNTER — Ambulatory Visit (INDEPENDENT_AMBULATORY_CARE_PROVIDER_SITE_OTHER): Payer: Medicare HMO | Admitting: Family Medicine

## 2020-07-11 ENCOUNTER — Encounter: Payer: Self-pay | Admitting: Family Medicine

## 2020-07-11 ENCOUNTER — Other Ambulatory Visit: Payer: Self-pay

## 2020-07-11 VITALS — BP 158/78 | HR 76 | Temp 98.2°F | Ht 62.0 in | Wt 149.0 lb

## 2020-07-11 DIAGNOSIS — I10 Essential (primary) hypertension: Secondary | ICD-10-CM | POA: Diagnosis not present

## 2020-07-11 DIAGNOSIS — M353 Polymyalgia rheumatica: Secondary | ICD-10-CM | POA: Diagnosis not present

## 2020-07-11 DIAGNOSIS — N182 Chronic kidney disease, stage 2 (mild): Secondary | ICD-10-CM | POA: Diagnosis not present

## 2020-07-11 LAB — BASIC METABOLIC PANEL WITH GFR
BUN: 23 mg/dL (ref 7–25)
CO2: 31 mmol/L (ref 20–32)
Calcium: 9.7 mg/dL (ref 8.6–10.4)
Chloride: 101 mmol/L (ref 98–110)
Creat: 0.74 mg/dL (ref 0.60–0.93)
GFR, Est African American: 92 mL/min/{1.73_m2} (ref >=60)
GFR, Est Non African American: 80 mL/min/{1.73_m2} (ref >=60)
Glucose, Bld: 92 mg/dL (ref 65–99)
Potassium: 4 mmol/L (ref 3.5–5.3)
Sodium: 140 mmol/L (ref 135–146)

## 2020-07-11 MED ORDER — PREDNISONE 10 MG PO TABS: 5.0000 mg | ORAL_TABLET | Freq: Every day | ORAL | 1 refills | Status: DC

## 2020-07-11 MED ORDER — ENALAPRIL MALEATE 20 MG PO TABS: 20.0000 mg | ORAL_TABLET | Freq: Every day | ORAL | 3 refills | Status: DC

## 2020-07-11 NOTE — Progress Notes (Signed)
° °  Subjective:    Patient ID: [name], female    DOB: 09/18/1945, 74 y.o.   MRN: [id number]  HPI Here to follow up on issues. She feels well in general. Her PMR is stable on 10 mg a day of prednisone. Her BP is stable. Her last calcium level was normal at 10.0 on 03-22-20.    Review of Systems  Constitutional: Negative.   Respiratory: Negative.   Cardiovascular: Negative.        Objective:   Physical Exam Constitutional:      Appearance: Normal appearance.  Cardiovascular:     Rate and Rhythm: Normal rate and regular rhythm.     Pulses: Normal pulses.     Heart sounds: Normal heart sounds.  Pulmonary:     Effort: Pulmonary effort is normal.     Breath sounds: Normal breath sounds.  Musculoskeletal:     Right lower leg: No edema.     Left lower leg: No edema.  Neurological:     Mental Status: She is alert.           Assessment & Plan:  The HTN is stable. For the CKD and hypercalcemia, check a BMET today. For the PMR, decrease the Prednisone to 5 mg daily.  Gershon Crane, MD

## 2020-07-14 ENCOUNTER — Telehealth: Payer: Self-pay | Admitting: Family Medicine

## 2020-07-14 NOTE — Telephone Encounter (Signed)
Pt would like to know if she can have 5 mg instead of 10 mg . pt haven't picked up the 10 mg of prednisone [id number] - [place], [place] - [address] [facility] [address]  Phone:  [phone] Fax:  [phone]

## 2020-07-18 NOTE — Telephone Encounter (Signed)
Patient would like to get the 5 mg tab for convince reasons instead of 10 mg tab.  She is not out of meds and using what she had on hand.   Please advise.  Thanks,. Dm/cma

## 2020-07-20 MED ORDER — PREDNISONE 5 MG PO TABS: 5.0000 mg | ORAL_TABLET | Freq: Every day | ORAL | 3 refills | Status: DC

## 2020-07-20 NOTE — Telephone Encounter (Signed)
I sent in for 5 mg daily

## 2020-07-20 NOTE — Telephone Encounter (Signed)
Called to inform patient of message below no answer LM on detailed VM asked to call with any concerns.

## 2021-01-26 ENCOUNTER — Other Ambulatory Visit: Payer: Self-pay

## 2021-01-26 ENCOUNTER — Encounter: Payer: Self-pay | Admitting: Sports Medicine

## 2021-01-26 ENCOUNTER — Ambulatory Visit: Payer: Medicare HMO | Admitting: Sports Medicine

## 2021-01-26 DIAGNOSIS — M79675 Pain in left toe(s): Secondary | ICD-10-CM

## 2021-01-26 DIAGNOSIS — M79674 Pain in right toe(s): Secondary | ICD-10-CM

## 2021-01-26 DIAGNOSIS — B351 Tinea unguium: Secondary | ICD-10-CM

## 2021-01-26 NOTE — Progress Notes (Signed)
Subjective: [name] is a 75 y.o. female patient seen today in office with complaint of mildly painful thickened and elongated toenails; unable to trim. Patient denies history of Diabetes, Neuropathy, or Vascular disease.  Admits to a history of Polymyalgia rheumatica.  Patient has no other pedal complaints at this time.   Patient Active Problem List   Diagnosis Date Noted   Lesion of tonsil 03/15/2020   AKI (acute kidney injury) ([place]) 03/06/2020   CKD stage G2/A2, GFR 60-89 and albumin creatinine ratio 30-299 mg/g 03/06/2020   Hypomagnesemia 03/06/2020   Polymyalgia rheumatica ([place]) 03/06/2020   Hypercalcemia 03/04/2020   Confusion 03/02/2020   Memory loss 03/02/2020   B12 deficiency 01/09/2019   Vertigo 10/31/2018   Hypokalemia 10/31/2018   Chest pain 10/30/2018   Carpal tunnel syndrome, bilateral 08/11/2018   OTHER AND UNSPECIFIED HYPERLIPIDEMIA 11/22/2009   LACUNAR INFARCTION 11/22/2009   BACK PAIN, CHRONIC 11/22/2009   UNSPECIFIED RETINAL DEFECT 11/08/2009   ROTATOR CUFF INJURY, RIGHT SHOULDER 11/08/2009   BRANCH RETINAL VEIN OCCLUSION 08/12/2007   Essential hypertension 08/12/2007    Current Outpatient Medications on File Prior to Visit  Medication Sig Dispense Refill   [facility] VITAMIN B12 1000 MCG tablet TAKE 1 TABLET BY MOUTH EVERY DAY 90 tablet 3   enalapril (VASOTEC) 20 MG tablet Take 1 tablet (20 mg total) by mouth daily. 90 tablet 3   hydrochlorothiazide (HYDRODIURIL) 25 MG tablet Take 25 mg by mouth daily.     Multiple Vitamin (MULTIVITAMIN PO) Take by mouth daily.     predniSONE (DELTASONE) 5 MG tablet Take 1 tablet (5 mg total) by mouth daily with breakfast. 90 tablet 3   No current facility-administered medications on file prior to visit.    Allergies  Allergen Reactions   Amoxicillin Nausea Only   Meloxicam Other (See Comments)    Muscle weakness     Objective: Physical Exam  General: Well developed, nourished, no acute distress, awake, alert and  oriented x 3  Vascular: Dorsalis pedis artery 1/4 bilateral, Posterior tibial artery 1/4 bilateral, skin temperature warm to warm proximal to distal bilateral lower extremities, no varicosities, pedal hair present bilateral.  Neurological: Gross sensation present via light touch bilateral.   Dermatological: Skin is warm, dry, and supple bilateral, Nails 1-10 are tender, long, thick, and discolored with mild subungal debris, no webspace macerations present bilateral, no open lesions present bilateral, no callus/corns/hyperkeratotic tissue present bilateral. No signs of infection bilateral.  Musculoskeletal: Asymptomatic pes planus and hammertoe boney deformities noted bilateral. Muscular strength within normal limits without painon range of motion. No pain with calf compression bilateral.  Assessment and Plan:  Problem List Items Addressed This Visit   None Visit Diagnoses     Pain due to onychomycosis of toenails of both feet    -  Primary       -Examined patient.  -Discussed treatment options for painful mycotic nails. -Mechanically debrided and reduced mycotic nails with sterile nail nipper and dremel nail file without incident. -Patient to return in 3 months for follow up evaluation or sooner if symptoms worsen.  Landis Martins, DPM

## 2021-02-09 DIAGNOSIS — H5213 Myopia, bilateral: Secondary | ICD-10-CM | POA: Diagnosis not present

## 2021-02-09 DIAGNOSIS — H52223 Regular astigmatism, bilateral: Secondary | ICD-10-CM | POA: Diagnosis not present

## 2021-02-09 DIAGNOSIS — H40053 Ocular hypertension, bilateral: Secondary | ICD-10-CM | POA: Diagnosis not present

## 2021-02-09 DIAGNOSIS — H524 Presbyopia: Secondary | ICD-10-CM | POA: Diagnosis not present

## 2021-02-09 DIAGNOSIS — H40043 Steroid responder, bilateral: Secondary | ICD-10-CM | POA: Diagnosis not present

## 2021-02-09 DIAGNOSIS — H40023 Open angle with borderline findings, high risk, bilateral: Secondary | ICD-10-CM | POA: Diagnosis not present

## 2021-02-17 DIAGNOSIS — M353 Polymyalgia rheumatica: Secondary | ICD-10-CM | POA: Diagnosis not present

## 2021-02-17 DIAGNOSIS — Z8249 Family history of ischemic heart disease and other diseases of the circulatory system: Secondary | ICD-10-CM | POA: Diagnosis not present

## 2021-02-17 DIAGNOSIS — G8929 Other chronic pain: Secondary | ICD-10-CM | POA: Diagnosis not present

## 2021-02-17 DIAGNOSIS — Z9181 History of falling: Secondary | ICD-10-CM | POA: Diagnosis not present

## 2021-02-17 DIAGNOSIS — E663 Overweight: Secondary | ICD-10-CM | POA: Diagnosis not present

## 2021-02-17 DIAGNOSIS — Z6827 Body mass index (BMI) 27.0-27.9, adult: Secondary | ICD-10-CM | POA: Diagnosis not present

## 2021-02-17 DIAGNOSIS — M1990 Unspecified osteoarthritis, unspecified site: Secondary | ICD-10-CM | POA: Diagnosis not present

## 2021-02-17 DIAGNOSIS — Z833 Family history of diabetes mellitus: Secondary | ICD-10-CM | POA: Diagnosis not present

## 2021-02-17 DIAGNOSIS — H409 Unspecified glaucoma: Secondary | ICD-10-CM | POA: Diagnosis not present

## 2021-02-17 DIAGNOSIS — I10 Essential (primary) hypertension: Secondary | ICD-10-CM | POA: Diagnosis not present

## 2021-02-17 DIAGNOSIS — Z008 Encounter for other general examination: Secondary | ICD-10-CM | POA: Diagnosis not present

## 2021-04-13 ENCOUNTER — Telehealth: Payer: Self-pay | Admitting: Family Medicine

## 2021-04-13 NOTE — Telephone Encounter (Signed)
Spoke with patient to schedule awv She stated insurance came home to do awv

## 2021-04-13 NOTE — Telephone Encounter (Signed)
Left message for patient to call back and schedule Medicare Annual Wellness Visit (AWV) either virtually or in office. Left  my Herbie Drape number [phone]   Last AWV 05/30/20  please schedule at anytime with [facility] Nurse Health Advisor 1 or 2   This should be a 45 minute visit.    Holland Falling can do AWV calendar year

## 2021-05-01 ENCOUNTER — Other Ambulatory Visit: Payer: Self-pay | Admitting: Family Medicine

## 2021-05-01 NOTE — Telephone Encounter (Signed)
Pt need appointment for further refills 

## 2021-05-11 ENCOUNTER — Other Ambulatory Visit: Payer: Self-pay | Admitting: Family Medicine

## 2021-05-11 NOTE — Telephone Encounter (Signed)
Left a message for pt to call the office and schedule appointment with Dr Sarajane Jews

## 2021-05-15 NOTE — Telephone Encounter (Signed)
Pt needs appointment for further refills 

## 2021-05-30 ENCOUNTER — Ambulatory Visit (INDEPENDENT_AMBULATORY_CARE_PROVIDER_SITE_OTHER): Payer: Medicare HMO | Admitting: Family Medicine

## 2021-05-30 ENCOUNTER — Encounter: Payer: Self-pay | Admitting: Family Medicine

## 2021-05-30 VITALS — BP 140/78 | HR 88 | Temp 98.3°F | Ht 62.0 in | Wt 148.0 lb

## 2021-05-30 DIAGNOSIS — M810 Age-related osteoporosis without current pathological fracture: Secondary | ICD-10-CM | POA: Diagnosis not present

## 2021-05-30 DIAGNOSIS — R739 Hyperglycemia, unspecified: Secondary | ICD-10-CM

## 2021-05-30 DIAGNOSIS — N182 Chronic kidney disease, stage 2 (mild): Secondary | ICD-10-CM | POA: Diagnosis not present

## 2021-05-30 DIAGNOSIS — E876 Hypokalemia: Secondary | ICD-10-CM | POA: Diagnosis not present

## 2021-05-30 DIAGNOSIS — M353 Polymyalgia rheumatica: Secondary | ICD-10-CM | POA: Diagnosis not present

## 2021-05-30 DIAGNOSIS — Z1211 Encounter for screening for malignant neoplasm of colon: Secondary | ICD-10-CM

## 2021-05-30 DIAGNOSIS — E538 Deficiency of other specified B group vitamins: Secondary | ICD-10-CM

## 2021-05-30 DIAGNOSIS — I10 Essential (primary) hypertension: Secondary | ICD-10-CM

## 2021-05-30 DIAGNOSIS — R413 Other amnesia: Secondary | ICD-10-CM | POA: Diagnosis not present

## 2021-05-30 DIAGNOSIS — I6350 Cerebral infarction due to unspecified occlusion or stenosis of unspecified cerebral artery: Secondary | ICD-10-CM | POA: Diagnosis not present

## 2021-05-30 DIAGNOSIS — Z23 Encounter for immunization: Secondary | ICD-10-CM | POA: Diagnosis not present

## 2021-05-30 LAB — CBC WITH DIFFERENTIAL/PLATELET
Basophils Absolute: 0 10*3/uL (ref 0.0–0.1)
Basophils Relative: 0.6 % (ref 0.0–3.0)
Eosinophils Absolute: 0.1 10*3/uL (ref 0.0–0.7)
Eosinophils Relative: 1.2 % (ref 0.0–5.0)
HCT: 43.1 % (ref 36.0–46.0)
Hemoglobin: 14.2 g/dL (ref 12.0–15.0)
Lymphocytes Relative: 30.8 % (ref 12.0–46.0)
Lymphs Abs: 2.1 10*3/uL (ref 0.7–4.0)
MCHC: 33 g/dL (ref 30.0–36.0)
MCV: 86.1 fl (ref 78.0–100.0)
Monocytes Absolute: 0.6 10*3/uL (ref 0.1–1.0)
Monocytes Relative: 8.2 % (ref 3.0–12.0)
Neutro Abs: 4.1 10*3/uL (ref 1.4–7.7)
Neutrophils Relative %: 59.2 % (ref 43.0–77.0)
Platelets: 207 10*3/uL (ref 150.0–400.0)
RBC: 5 Mil/uL (ref 3.87–5.11)
RDW: 14.8 % (ref 11.5–15.5)
WBC: 6.9 10*3/uL (ref 4.0–10.5)

## 2021-05-30 LAB — HEPATIC FUNCTION PANEL
ALT: 7 U/L (ref 0–35)
AST: 21 U/L (ref 0–37)
Albumin: 3.9 g/dL (ref 3.5–5.2)
Alkaline Phosphatase: 64 U/L (ref 39–117)
Bilirubin, Direct: 0.1 mg/dL (ref 0.0–0.3)
Total Bilirubin: 0.6 mg/dL (ref 0.2–1.2)
Total Protein: 7.4 g/dL (ref 6.0–8.3)

## 2021-05-30 LAB — BASIC METABOLIC PANEL
BUN: 18 mg/dL (ref 6–23)
CO2: 30 mEq/L (ref 19–32)
Calcium: 9.8 mg/dL (ref 8.4–10.5)
Chloride: 101 mEq/L (ref 96–112)
Creatinine, Ser: 0.83 mg/dL (ref 0.40–1.20)
GFR: 69.2 mL/min (ref >60.00)
Glucose, Bld: 87 mg/dL (ref 70–99)
Potassium: 3.7 mEq/L (ref 3.5–5.1)
Sodium: 139 mEq/L (ref 135–145)

## 2021-05-30 LAB — VITAMIN B12: Vitamin B-12: 897 pg/mL (ref 211–911)

## 2021-05-30 LAB — LIPID PANEL
Cholesterol: 210 mg/dL — ABNORMAL HIGH (ref 0–200)
HDL: 51.8 mg/dL (ref >39.00)
NonHDL: 158.01
Total CHOL/HDL Ratio: 4
Triglycerides: 206 mg/dL — ABNORMAL HIGH (ref 0.0–149.0)
VLDL: 41.2 mg/dL — ABNORMAL HIGH (ref 0.0–40.0)

## 2021-05-30 LAB — MAGNESIUM: Magnesium: 1.7 mg/dL (ref 1.5–2.5)

## 2021-05-30 LAB — LDL CHOLESTEROL, DIRECT: Direct LDL: 129 mg/dL

## 2021-05-30 LAB — TSH: TSH: 2.34 u[IU]/mL (ref 0.35–5.50)

## 2021-05-30 LAB — SEDIMENTATION RATE: Sed Rate: 36 mm/hr — ABNORMAL HIGH (ref 0–30)

## 2021-05-30 LAB — HEMOGLOBIN A1C: Hgb A1c MFr Bld: 5.1 % (ref 4.6–6.5)

## 2021-05-30 LAB — C-REACTIVE PROTEIN: CRP: <1 mg/dL (ref 0.5–20.0)

## 2021-05-30 MED ORDER — ENALAPRIL MALEATE 20 MG PO TABS: 20.0000 mg | ORAL_TABLET | Freq: Every day | ORAL | 3 refills | Status: DC

## 2021-05-30 MED ORDER — PREDNISONE 5 MG PO TABS
5.0000 mg | ORAL_TABLET | Freq: Every day | ORAL | 3 refills | Status: DC
Start: 2021-05-30 — End: 2021-10-04

## 2021-05-30 MED ORDER — HYDROCHLOROTHIAZIDE 25 MG PO TABS: 25.0000 mg | ORAL_TABLET | Freq: Every day | ORAL | 3 refills | Status: DC

## 2021-05-30 NOTE — Addendum Note (Signed)
Addended by: Wyvonne Lenz on: 05/30/2021 05:38 PM   Modules accepted: Orders

## 2021-05-30 NOTE — Addendum Note (Signed)
Addended by: Amanda Cockayne on: 05/30/2021 02:34 PM   Modules accepted: Orders

## 2021-05-30 NOTE — Progress Notes (Signed)
Subjective:    Patient ID: Read Drivers, female    DOB: October 18, 1945, 75 y.o.   MRN: [id number]  HPI Here to follow up on issues. Her main complaint today is polymyalgia rheumatica. She had been Prednisone daily for several years, but she weaned herself off this about 3 months ago. Now the shoulder pain and hip pain is very worrisome again and they make it hard for her to get around. Her BP at home has been stable. Her memory loss has been stable.    Review of Systems  Constitutional: Negative.   HENT: Negative.    Eyes: Negative.   Respiratory: Negative.    Cardiovascular: Negative.   Gastrointestinal: Negative.   Genitourinary:  Negative for decreased urine volume, difficulty urinating, dyspareunia, dysuria, enuresis, flank pain, frequency, hematuria, pelvic pain and urgency.  Musculoskeletal:  Positive for arthralgias.  Skin: Negative.   Neurological: Negative.  Negative for headaches.  Psychiatric/Behavioral: Negative.        Objective:   Physical Exam Constitutional:      General: She is not in acute distress.    Appearance: She is well-developed.  HENT:     Head: Normocephalic and atraumatic.     Right Ear: External ear normal.     Left Ear: External ear normal.     Nose: Nose normal.     Mouth/Throat:     Pharynx: No oropharyngeal exudate.  Eyes:     General: No scleral icterus.    Conjunctiva/sclera: Conjunctivae normal.     Pupils: Pupils are equal, round, and reactive to light.  Neck:     Thyroid: No thyromegaly.     Vascular: No JVD.  Cardiovascular:     Rate and Rhythm: Normal rate and regular rhythm.     Heart sounds: Normal heart sounds. No murmur heard.   No friction rub. No gallop.  Pulmonary:     Effort: Pulmonary effort is normal. No respiratory distress.     Breath sounds: Normal breath sounds. No wheezing or rales.  Chest:     Chest wall: No tenderness.  Abdominal:     General: Bowel sounds are normal. There is no distension.     Palpations:  Abdomen is soft. There is no mass.     Tenderness: There is no abdominal tenderness. There is no guarding or rebound.  Musculoskeletal:        General: No tenderness. Normal range of motion.     Cervical back: Normal range of motion and neck supple.  Lymphadenopathy:     Cervical: No cervical adenopathy.  Skin:    General: Skin is warm and dry.     Findings: No erythema or rash.  Neurological:     Mental Status: She is alert and oriented to person, place, and time.     Cranial Nerves: No cranial nerve deficit.     Motor: No abnormal muscle tone.     Coordination: Coordination normal.     Deep Tendon Reflexes: Reflexes are normal and symmetric. Reflexes normal.  Psychiatric:        Behavior: Behavior normal.        Thought Content: Thought content normal.        Judgment: Judgment normal.          Assessment & Plan:  Her HTN and memory loss are stable. For the PMR we will start her back on Prednisone 5 mg daily. Get fasting labs for lipids, renal function, and inflammation markers. Set up another DEXA. She  declines any further colonoscopies, so she will do a Cologuard test instead. We spent a total of ( 36  ) minutes reviewing records and discussing these issues.  Alysia Penna, MD

## 2021-06-27 DIAGNOSIS — H5213 Myopia, bilateral: Secondary | ICD-10-CM | POA: Diagnosis not present

## 2021-06-27 DIAGNOSIS — H35033 Hypertensive retinopathy, bilateral: Secondary | ICD-10-CM | POA: Diagnosis not present

## 2021-06-27 DIAGNOSIS — H3509 Other intraretinal microvascular abnormalities: Secondary | ICD-10-CM | POA: Diagnosis not present

## 2021-06-27 DIAGNOSIS — I10 Essential (primary) hypertension: Secondary | ICD-10-CM | POA: Diagnosis not present

## 2021-06-27 DIAGNOSIS — H52223 Regular astigmatism, bilateral: Secondary | ICD-10-CM | POA: Diagnosis not present

## 2021-06-27 DIAGNOSIS — H348332 Tributary (branch) retinal vein occlusion, bilateral, stable: Secondary | ICD-10-CM | POA: Diagnosis not present

## 2021-06-27 DIAGNOSIS — H524 Presbyopia: Secondary | ICD-10-CM | POA: Diagnosis not present

## 2021-07-28 ENCOUNTER — Other Ambulatory Visit: Payer: Self-pay | Admitting: Family Medicine

## 2021-09-25 ENCOUNTER — Emergency Department (HOSPITAL_BASED_OUTPATIENT_CLINIC_OR_DEPARTMENT_OTHER): Payer: Medicare HMO | Admitting: Radiology

## 2021-09-25 ENCOUNTER — Other Ambulatory Visit: Payer: Self-pay

## 2021-09-25 ENCOUNTER — Emergency Department (HOSPITAL_BASED_OUTPATIENT_CLINIC_OR_DEPARTMENT_OTHER): Payer: Medicare HMO

## 2021-09-25 ENCOUNTER — Emergency Department (HOSPITAL_BASED_OUTPATIENT_CLINIC_OR_DEPARTMENT_OTHER)
Admission: EM | Admit: 2021-09-25 | Discharge: 2021-09-25 | Disposition: A | Discharge status: Home / Self Care | Payer: Medicare HMO | Attending: Emergency Medicine | Admitting: Emergency Medicine

## 2021-09-25 ENCOUNTER — Encounter (HOSPITAL_BASED_OUTPATIENT_CLINIC_OR_DEPARTMENT_OTHER): Payer: Self-pay | Admitting: Emergency Medicine

## 2021-09-25 DIAGNOSIS — R531 Weakness: Secondary | ICD-10-CM | POA: Diagnosis not present

## 2021-09-25 DIAGNOSIS — R0602 Shortness of breath: Secondary | ICD-10-CM | POA: Insufficient documentation

## 2021-09-25 DIAGNOSIS — R42 Dizziness and giddiness: Secondary | ICD-10-CM | POA: Diagnosis not present

## 2021-09-25 DIAGNOSIS — R109 Unspecified abdominal pain: Secondary | ICD-10-CM | POA: Diagnosis not present

## 2021-09-25 DIAGNOSIS — K5730 Diverticulosis of large intestine without perforation or abscess without bleeding: Secondary | ICD-10-CM | POA: Diagnosis not present

## 2021-09-25 DIAGNOSIS — Z20822 Contact with and (suspected) exposure to covid-19: Secondary | ICD-10-CM | POA: Insufficient documentation

## 2021-09-25 DIAGNOSIS — R000 Tachycardia, unspecified: Secondary | ICD-10-CM | POA: Diagnosis not present

## 2021-09-25 DIAGNOSIS — R7989 Other specified abnormal findings of blood chemistry: Secondary | ICD-10-CM | POA: Diagnosis not present

## 2021-09-25 LAB — TROPONIN I (HIGH SENSITIVITY)
Troponin I (High Sensitivity): 8 ng/L (ref <18)
Troponin I (High Sensitivity): 10 ng/L (ref <18)

## 2021-09-25 LAB — URINALYSIS, ROUTINE W REFLEX MICROSCOPIC
Bilirubin Urine: NEGATIVE
Glucose, UA: NEGATIVE mg/dL
Hgb urine dipstick: NEGATIVE
Ketones, ur: 15 mg/dL — AB
Nitrite: NEGATIVE
Protein, ur: NEGATIVE mg/dL
Specific Gravity, Urine: 1.009 (ref 1.005–1.030)
pH: 5 (ref 5.0–8.0)

## 2021-09-25 LAB — RESP PANEL BY RT-PCR (FLU A&B, COVID) ARPGX2
Influenza A by PCR: NEGATIVE
Influenza B by PCR: NEGATIVE
SARS Coronavirus 2 by RT PCR: NEGATIVE

## 2021-09-25 LAB — COMPREHENSIVE METABOLIC PANEL
ALT: <5 U/L (ref 0–44)
AST: 21 U/L (ref 15–41)
Albumin: 4.1 g/dL (ref 3.5–5.0)
Alkaline Phosphatase: 47 U/L (ref 38–126)
Anion gap: 16 — ABNORMAL HIGH (ref 5–15)
BUN: 21 mg/dL (ref 8–23)
CO2: 23 mmol/L (ref 22–32)
Calcium: 10.1 mg/dL (ref 8.9–10.3)
Chloride: 98 mmol/L (ref 98–111)
Creatinine, Ser: 0.89 mg/dL (ref 0.44–1.00)
GFR, Estimated: >60 mL/min (ref >60)
Glucose, Bld: 107 mg/dL — ABNORMAL HIGH (ref 70–99)
Potassium: 3.7 mmol/L (ref 3.5–5.1)
Sodium: 137 mmol/L (ref 135–145)
Total Bilirubin: 0.9 mg/dL (ref 0.3–1.2)
Total Protein: 7.7 g/dL (ref 6.5–8.1)

## 2021-09-25 LAB — CBC WITH DIFFERENTIAL/PLATELET
Abs Immature Granulocytes: 0.02 10*3/uL (ref 0.00–0.07)
Basophils Absolute: 0 10*3/uL (ref 0.0–0.1)
Basophils Relative: 0 %
Eosinophils Absolute: 0 10*3/uL (ref 0.0–0.5)
Eosinophils Relative: 0 %
HCT: 46.1 % — ABNORMAL HIGH (ref 36.0–46.0)
Hemoglobin: 14.7 g/dL (ref 12.0–15.0)
Immature Granulocytes: 0 %
Lymphocytes Relative: 18 %
Lymphs Abs: 1.4 10*3/uL (ref 0.7–4.0)
MCH: 28.1 pg (ref 26.0–34.0)
MCHC: 31.9 g/dL (ref 30.0–36.0)
MCV: 88 fL (ref 80.0–100.0)
Monocytes Absolute: 0.5 10*3/uL (ref 0.1–1.0)
Monocytes Relative: 6 %
Neutro Abs: 5.7 10*3/uL (ref 1.7–7.7)
Neutrophils Relative %: 76 %
Platelets: 186 10*3/uL (ref 150–400)
RBC: 5.24 MIL/uL — ABNORMAL HIGH (ref 3.87–5.11)
RDW: 14.8 % (ref 11.5–15.5)
WBC: 7.6 10*3/uL (ref 4.0–10.5)
nRBC: 0 % (ref 0.0–0.2)

## 2021-09-25 LAB — LIPASE, BLOOD: Lipase: 52 U/L — ABNORMAL HIGH (ref 11–51)

## 2021-09-25 LAB — CK: Total CK: 22 U/L — ABNORMAL LOW (ref 38–234)

## 2021-09-25 LAB — D-DIMER, QUANTITATIVE: D-Dimer, Quant: 2.36 ug/mL-FEU — ABNORMAL HIGH (ref 0.00–0.50)

## 2021-09-25 MED ORDER — IOHEXOL 350 MG/ML SOLN
75.0000 mL | Freq: Once | INTRAVENOUS | Status: AC | PRN
  Administered 2021-09-25: 75 mL via INTRAVENOUS

## 2021-09-25 MED ORDER — SODIUM CHLORIDE 0.9 % IV BOLUS
1000.0000 mL | Freq: Once | INTRAVENOUS | Status: AC
  Administered 2021-09-25: 1000 mL via INTRAVENOUS

## 2021-09-25 NOTE — ED Notes (Signed)
Lab to add on CK, Lipase, and d-dimer to blood already collected.  ?

## 2021-09-25 NOTE — ED Triage Notes (Signed)
C/O of shortness of breathe and dizziness for couple of months. States "my heart feels strange". Endorsed hx of PMR.  ?

## 2021-09-25 NOTE — Discharge Instructions (Addendum)
If you develop severe headache, new or worsening weakness, fever, chest pain, back pain, abdominal pain, or any other new/concerning symptoms then return to the ER for evaluation. ?

## 2021-09-25 NOTE — ED Provider Notes (Incomplete)
[facility] EMERGENC[facility] DEPT Provider Note   CSN: [id number] Arrival date & time: 09/25/21  1817     History {Add pertinent medical, surgical, social history, OB history to HPI:1} Chief Complaint  Patient presents with   Shortness of Breath   Dizziness    [name] is a 76 y.o. female.  HPI 76 year old female presents with a chief complaint of bilateral leg weakness.  She has a history of PMR and has been dealing with muscle pains ever since.  However she notes that she has been lightheaded and having on and off low blood pressures for a month or more.  Now today she is feeling like her legs are giving out on her when she tries to walk.  They are not really hurting but just feel weak.  It is both legs.  Some abdominal discomfort is hard for her to describe over the last 24-48 hours.  She has not eaten since 2 days ago due to no appetite.  She denies any urinary symptoms.  No fevers.  When asked about shortness of breath, which is listed as a chief complaint she denies this and denies any chest pain.  Home Medications Prior to Admission medications   Medication Sig Start Date End Date Taking? Authorizing Provider  [facility] VITAMIN B12 1000 MCG tablet TAKE 1 TABLET BY MOUTH EVERY DAY 07/01/20   Laurey Morale, MD  enalapril (VASOTEC) 20 MG tablet Take 1 tablet (20 mg total) by mouth daily. 05/30/21   Laurey Morale, MD  hydrochlorothiazide (HYDRODIURIL) 25 MG tablet Take 1 tablet (25 mg total) by mouth daily. 05/30/21   Laurey Morale, MD  Multiple Vitamin (MULTIVITAMIN PO) Take by mouth daily.    [provider]  predniSONE (DELTASONE) 5 MG tablet Take 1 tablet (5 mg total) by mouth daily with breakfast. 05/30/21   Laurey Morale, MD      Allergies    Amoxicillin and Meloxicam    Review of Systems   Review of Systems  Constitutional:  Positive for fatigue. Negative for fever.  Respiratory:  Negative for shortness of breath.   Cardiovascular:  Negative for  chest pain.  Gastrointestinal:  Positive for abdominal pain.  Musculoskeletal:  Negative for back pain.  Neurological:  Positive for weakness and light-headedness. Negative for numbness.   Physical Exam Updated Vital Signs BP (!) 162/82 (BP Location: Right Arm)    Pulse (!) 103    Temp 97.9 F (36.6 C)    Resp 18    Ht '5\' 2"'$  (1.575 m)    Wt 62.1 kg    SpO2 98%    BMI 25.06 kg/m  Physical Exam Vitals and nursing note reviewed.  Constitutional:      Appearance: She is well-developed.  HENT:     Head: Normocephalic and atraumatic.  Cardiovascular:     Rate and Rhythm: Normal rate and regular rhythm.     Pulses:          Posterior tibial pulses are 2+ on the right side and 2+ on the left side.     Heart sounds: Normal heart sounds.  Pulmonary:     Effort: Pulmonary effort is normal.     Breath sounds: Normal breath sounds. No decreased breath sounds, wheezing, rhonchi or rales.  Abdominal:     Palpations: Abdomen is soft.     Tenderness: There is no abdominal tenderness.  Musculoskeletal:     Comments: No tenderness to thighs/lower legs. No swelling  Skin:  General: Skin is warm and dry.  Neurological:     Mental Status: She is alert.     Comments: CN 3-12 grossly intact. 5/5 strength in all 4 extremities. Grossly normal sensation. Normal finger to nose.     ED Results / Procedures / Treatments   Labs (all labs ordered are listed, but only abnormal results are displayed) Labs Reviewed  CBC WITH DIFFERENTIAL/PLATELET - Abnormal; Notable for the following components:      Result Value   RBC 5.24 (*)    HCT 46.1 (*)    All other components within normal limits  COMPREHENSIVE METABOLIC PANEL - Abnormal; Notable for the following components:   Glucose, Bld 107 (*)    Anion gap 16 (*)    All other components within normal limits  RESP PANEL BY RT-PCR (FLU A&B, COVID) ARPGX2  URINALYSIS, ROUTINE W REFLEX MICROSCOPIC  CK  LIPASE, BLOOD  TROPONIN I (HIGH SENSITIVITY)   TROPONIN I (HIGH SENSITIVITY)    EKG EKG Interpretation  Date/Time:  Monday September 25 2021 18:38:49 EDT Ventricular Rate:  109 PR Interval:  158 QRS Duration: 80 QT Interval:  338 QTC Calculation: 455 R Axis:   48 Text Interpretation: Sinus tachycardia Right atrial enlargement Nonspecific ST and T wave abnormality Poor data quality in current ECG precludes serial comparison Confirmed by Sherwood Gambler [phone]) on 09/25/2021 7:31:14 PM  Radiology DG Chest 2 View  Result Date: 09/25/2021 CLINICAL DATA:  Shortness of breath and dizziness for couple months, history polymyalgia rheumatica EXAM: CHEST - 2 VIEW COMPARISON:  02/06/2019 FINDINGS: Normal heart size, mediastinal contours, and pulmonary vascularity. Atherosclerotic calcification aorta. Lungs clear. No pulmonary infiltrate, pleural effusion, or pneumothorax. Osseous demineralization with scattered mild endplate spur formation thoracic spine. IMPRESSION: No acute abnormalities. Aortic Atherosclerosis (ICD10-I70.0). Electronically Signed   By: Lavonia Dana M.D.   On: 09/25/2021 19:01    Procedures Procedures  {Document cardiac monitor, telemetry assessment procedure when appropriate:1}  Medications Ordered in ED Medications  sodium chloride 0.9 % bolus 1,000 mL (has no administration in time range)    ED Course/ Medical Decision Making/ A&P                           Medical Decision Making Amount and/or Complexity of Data Reviewed Labs: ordered. Radiology: ordered.   ***  {Document critical care time when appropriate:1} {Document review of labs and clinical decision tools ie heart score, Chads2Vasc2 etc:1}  {Document your independent review of radiology images, and any outside records:1} {Document your discussion with family members, caretakers, and with consultants:1} {Document social determinants of health affecting pt's care:1} {Document your decision making why or why not admission, treatments were needed:1} Final  Clinical Impression(s) / ED Diagnoses Final diagnoses:  None    Rx / DC Orders ED Discharge Orders     None

## 2021-09-27 LAB — URINE CULTURE: Culture: 30000 — AB

## 2021-09-28 ENCOUNTER — Telehealth: Payer: Self-pay | Admitting: *Deleted

## 2021-09-28 NOTE — Telephone Encounter (Signed)
Post ED Visit - Positive Culture Follow-up ? ?Culture report reviewed by antimicrobial stewardship pharmacist: ?[place] Team ?'[]'$  Elenor Quinones, Pharm.D. ?'[]'$  Heide Guile, Pharm.D., BCPS AQ-ID ?'[]'$  Parks Neptune, Pharm.D., BCPS ?'[]'$  Alycia Rossetti, Pharm.D., BCPS ?'[]'$  [place], Pharm.D., BCPS, AAHIVP ?'[]'$  Legrand Como, Pharm.D., BCPS, AAHIVP ?'[]'$  Salome Arnt, PharmD, BCPS ?'[]'$  Johnnette Gourd, PharmD, BCPS ?'[]'$  [place] Better, PharmD, BCPS ?'[]'$  Leeroy Cha, PharmD ?'[]'$  Laqueta Linden, PharmD, BCPS ?'[]'$  Albertina Parr, PharmD ? ?[place] Team ?'[]'$  Leodis Sias, PharmD ?'[]'$  Lindell Spar, PharmD ?'[]'$  Royetta Asal, PharmD ?'[]'$  Graylin Shiver, Rph ?'[]'$  Rema Fendt) Glennon Mac, PharmD ?'[]'$  Arlyn Dunning, PharmD ?'[]'$  Netta Cedars, PharmD ?'[]'$  Dia Sitter, PharmD ?'[]'$  Leone Haven, PharmD ?'[]'$  Gretta Arab, PharmD ?'[]'$  Theodis Shove, PharmD ?'[]'$  Peggyann Juba, PharmD ?'[]'$  Reuel Boom, PharmD ? ? ?Positive urine culture ?Reviewed by Isla Pence, MD and no further patient follow-up is required at this time. ? ?Ardeen Fillers ?09/28/2021, 11:37 AM ?  ?

## 2021-10-04 ENCOUNTER — Encounter: Payer: Self-pay | Admitting: Family Medicine

## 2021-10-04 ENCOUNTER — Ambulatory Visit (INDEPENDENT_AMBULATORY_CARE_PROVIDER_SITE_OTHER): Payer: Medicare HMO | Admitting: Family Medicine

## 2021-10-04 VITALS — BP 136/80 | HR 85 | Temp 98.6°F | Wt 135.0 lb

## 2021-10-04 DIAGNOSIS — I700 Atherosclerosis of aorta: Secondary | ICD-10-CM | POA: Insufficient documentation

## 2021-10-04 DIAGNOSIS — N390 Urinary tract infection, site not specified: Secondary | ICD-10-CM

## 2021-10-04 DIAGNOSIS — R1310 Dysphagia, unspecified: Secondary | ICD-10-CM

## 2021-10-04 DIAGNOSIS — M353 Polymyalgia rheumatica: Secondary | ICD-10-CM | POA: Diagnosis not present

## 2021-10-04 MED ORDER — ATORVASTATIN CALCIUM 10 MG PO TABS: 10.0000 mg | ORAL_TABLET | Freq: Every day | ORAL | 3 refills | Status: DC

## 2021-10-04 MED ORDER — CEPHALEXIN 500 MG PO CAPS: 500.0000 mg | ORAL_CAPSULE | Freq: Three times a day (TID) | ORAL | 0 refills | Status: DC

## 2021-10-04 NOTE — Progress Notes (Signed)
? ?  Subjective:  ? ? Patient ID: [name], female    DOB: [birth year], 76 y.o.   MRN: [id number] ? ?HPI ?Here to follow up on an ED visit on 09-25-21 and for other issues. On the day of the ED visit she had been feeling generalized weakness which was worse in the legs than anywhere else. No other specific complaints. She admitted to eating and drinking very little for the 2 days prior to this. At the ED her labs were remarkable for a positive D dimer at 2.36, a CK of 22, a glucose of 102, and a HGB of 14.7. her EKG and CXR were normal. She had a CT angiogram of the chest which was negative for any PE's, but it did show "severe aortic atherosclerosis". CT scans of the abdomen and pelvis were normal. Her UA looked questionable so a urine culture was sent off. This grew 30,000 colonies of Strep agalactiae. This was never followed up on however, and she was not contacted about it. Since that day she is eating better and drinking plenty of fluids. She feels totally back to normal. Second she has been having trouble swallowing for a few months. Sometimes swallowing is painful and sometimes she feels like food gets stuck part way down. No choking or vomiting. Third we have been treating her for polymyalgia rheumatica for years with low dose Prednisone. She now asks to see a Rheumatologist for further evaluation.  ? ? ?Review of Systems  ?Constitutional:  Positive for fatigue. Negative for chills, diaphoresis and fever.  ?HENT:  Positive for trouble swallowing.   ?Eyes: Negative.   ?Respiratory: Negative.    ?Cardiovascular: Negative.   ?Gastrointestinal: Negative.   ?Genitourinary: Negative.   ?Musculoskeletal:  Positive for arthralgias.  ?Neurological: Negative.   ? ?   ?Objective:  ? Physical Exam ?Constitutional:   ?   General: She is not in acute distress. ?   Appearance: Normal appearance.  ?Cardiovascular:  ?   Rate and Rhythm: Normal rate and regular rhythm.  ?   Pulses: Normal pulses.  ?   Heart sounds: Normal  heart sounds.  ?Pulmonary:  ?   Effort: Pulmonary effort is normal.  ?   Breath sounds: Normal breath sounds.  ?Neurological:  ?   Mental Status: She is alert.  ? ? ? ? ? ?   ?Assessment & Plan:  ?First it appears her ED visit was caused by dehydration, so we encouraged her to drink lots of fluids every day and to eat regular meals. For the UTI, we will treat this with 7 days of Keflex. For the dysphagia, we will refer her to GI. For the PMR, we will refer her to Rheumatology. For the aortic atherosclerosis, we will start her on Lipitor 10 mg daily. We spent a total of ( 35  ) minutes reviewing records and discussing these issues.  ?Alysia Penna, MD ? ? ?

## 2021-10-05 ENCOUNTER — Telehealth: Payer: Self-pay | Admitting: Family Medicine

## 2021-10-05 NOTE — Telephone Encounter (Signed)
Patient returned call from [place]. I let patient know that Garnette Czech was unavailable and would give her a call back when she was. ? ? ? ? ? ? ? ? ?

## 2021-10-05 NOTE — Telephone Encounter (Signed)
Patient called because she is needing to know whether or not to start the cephALEXin (KEFLEX) 500 MG capsule ? ?Patient states that because the urine culture was done on March 13th she feels there may not be a need for her to begin taking the prescription. ? ? ?Good callback number is [phone] ? ? ?Please advise  ?

## 2021-10-06 NOTE — Telephone Encounter (Signed)
Urine culture results on 09/25/21.   Please advise ?

## 2021-10-06 NOTE — Telephone Encounter (Signed)
We already discussed this. She should take the Cephalexin  ?

## 2021-10-06 NOTE — Telephone Encounter (Signed)
Patient called in to let someone know that she did start the abx. ? ?Please advise. ?

## 2021-10-06 NOTE — Telephone Encounter (Addendum)
Spoke with patient message given.

## 2021-10-23 ENCOUNTER — Encounter: Payer: Self-pay | Admitting: Gastroenterology

## 2021-10-23 ENCOUNTER — Telehealth: Payer: Self-pay | Admitting: Cardiology

## 2021-10-23 NOTE — Telephone Encounter (Signed)
?  Pt said, she got a CT and xray done at [place] location and wanted to f/u if Dr. Radford Pax can read it. She also made an appt with Dr. Radford Pax on 05/03 ?

## 2021-10-23 NOTE — Telephone Encounter (Signed)
Spoke with the patient and advised her that her test results were in her chart and that Dr. Radford Pax would be able to pull them up and review them with her at her office visit in May. Patient verbalized understanding.  ?

## 2021-11-02 ENCOUNTER — Ambulatory Visit (INDEPENDENT_AMBULATORY_CARE_PROVIDER_SITE_OTHER): Payer: Medicare HMO | Admitting: Gastroenterology

## 2021-11-02 ENCOUNTER — Encounter: Payer: Self-pay | Admitting: Gastroenterology

## 2021-11-02 VITALS — BP 138/72 | HR 77 | Ht 62.0 in | Wt 137.0 lb

## 2021-11-02 DIAGNOSIS — Z1211 Encounter for screening for malignant neoplasm of colon: Secondary | ICD-10-CM

## 2021-11-02 DIAGNOSIS — M353 Polymyalgia rheumatica: Secondary | ICD-10-CM

## 2021-11-02 DIAGNOSIS — R1310 Dysphagia, unspecified: Secondary | ICD-10-CM | POA: Diagnosis not present

## 2021-11-02 DIAGNOSIS — K5900 Constipation, unspecified: Secondary | ICD-10-CM

## 2021-11-02 DIAGNOSIS — R195 Other fecal abnormalities: Secondary | ICD-10-CM

## 2021-11-02 MED ORDER — CLENPIQ 10-3.5-12 MG-GM -GM/160ML PO SOLN: 1.0000 | ORAL | 0 refills | Status: DC

## 2021-11-02 NOTE — Patient Instructions (Addendum)
If you are age 76 or older, your body mass index should be between 23-30. Your Body mass index is 25.06 kg/m?Marland Kitchen If this is out of the aforementioned range listed, please consider follow up with your Primary Care Provider. ? ?__________________________________________________________ ? ?The [facility] providers would like to encourage you to use [facility] to communicate with providers for non-urgent requests or questions.  Due to long hold times on the telephone, sending your provider a message by [facility] may be a faster and more efficient way to get a response.  Please allow 48 business hours for a response.  Please remember that this is for non-urgent requests.   ? ?Due to recent changes in healthcare laws, you may see the results of your imaging and laboratory studies on MyChart before your provider has had a chance to review them.  We understand that in some cases there may be results that are confusing or concerning to you. Not all laboratory results come back in the same time frame and the provider may be waiting for multiple results in order to interpret others.  Please give [place] 48 hours in order for your provider to thoroughly review all the results before contacting the office for clarification of your results.   ? ?Please take Miralax 1 capful daily and increase water to 64 oz a day. ? ?We have sent the following medications to your pharmacy for you to pick up at your convenience: Clenpiq ? ?You have been scheduled for an endoscopy and colonoscopy. Please follow the written instructions given to you at your visit today. ?Please pick up your prep supplies at the pharmacy within the next 1-3 days. ?If you use inhalers (even only as needed), please bring them with you on the day of your procedure.  ? ? ?Thank you for choosing me and [facility]. ? ?Gerrit Heck, D.O. ? ? ? ?We want to thank you for trusting [facility] with your care. All of our staff and providers value the  relationships we have built with our patients, and it is an honor to care for you.  ? ?We are writing to let you know that [facility] will close on Nov 27, 2021, and we invite you to continue to see Dr. Carmell Austria and Gerrit Heck at the [facility] [place] office location. We are consolidating our serices at these [facility] practices to better provide care. Our office staff will work with you to ensure a seamless transition.  ? ?Gerrit Heck, DO -Dr. Bryan Lemma will be movig to [facility] at [address], [place], [place] [address], effective Nov 27, 2021.  Contact [phone] to schedule an appointment with him.  ? ?Carmell Austria, MD- Dr. Lyndel Safe will be movig to [facility] at [address], [place], [place] [address], effective Nov 27, 2021.  Contact [phone] to schedule an appointment with him.  ? ?Requesting Medical Records ?If you need to request your medical records, please follow the instructions below. Your medical records are confidential, and a copy can be transferred to another provider or released to you or another person you designate only with your permission. ? ?There are several ways to request your medical records: ?Requests for medical records can be submitted through our practice.   ?You can also request your records electronically, in your MyChart account by selecting the ?Request Health Records? tab.  ?If you need additional information on how to request records, please go to [email or web address]/, choose Patient Information,  then select Request Medical Records. ?To make an appointment or if you have any questions about your health care needs, please contact our office at [phone] and one of our staff members will be glad to assist you. ?[facility] is committed to providing exceptional care for you and our community. Thank you for allowing [place] to serve your health care needs. ?Sincerely, ? ?Windy Canny, Director  [facility] ?[facility] also offers convenient virtual care options. Sore throat? Sinus problems? Cold or flu symptoms? Get care from the comfort of home with [facility] Video Visits and e-Visits. Learn more about the non-emergency conditions treated and start your virtual visit at [email or web address]  ?

## 2021-11-02 NOTE — Progress Notes (Signed)
? ? ?          ?Chief Complaint: Abdominal pain, dysphagia, change in bowel habits ? ? ?Referring Provider:     Laurey Morale, MD ? ? ?HPI:   ? ? ?[name] is a 76 y.o. female with a history of polymyalgia rheumatica, HTN, aortic atherosclerosis, hysterectomy, cholecystectomy, referred to the Gastroenterology Clinic for evaluation of multiple GI symptoms, to include dysphagia, constipation, and change in bowel habits. ? ?She has been having oral pharyngeal dysphagia for the last 4-5 months.  Some trouble transferring food, but also pointing to anterior neck.  No history of food impactions. ? ?Separately, and more bothersome to her, she describes episodic pain with swallowing pointing to mid sternum. First episode was 5 years ago, and has had a couple of instances since, with last episode in 05/2021.   Symptoms last for couple of minutes, and slowly abate. ? ?Also with change in bowel habits. Started Jan 2023. Baseline is 2-3 formed stools/day. Now with 1 BM Q2 days. Some straining. No hematochezia. No associated abdominal pain. Only drinks 1-2 glasses of water/day. Has not trialed any medications, fiber supplement, stool softeners, etc.  ? ?Was seen in the ER on 09/25/2021 for evaluation of bilateral leg weakness and lightheadedness.  Did have some abdominal pain at that time as well. ?- Normal CBC, CMP as below ?- Lipase 51, normal CK, troponin ?- D-dimer 2.36 ?- CTA: No PE.  Querry atrophic left hepatic lobe (chronic).  No focal liver abnormality.  No duct dilation.  Normal pancreas, spleen, GI tract.  Sigmoid diverticulosis.  Aortic atherosclerosis. ?- Diagnosed with UTI by urine culture, treated with Keflex x7 days. ? ? ?For PMR, takes steroid courses periodically. Has taken MTX in the past (stopped d/t palpitation). Has referral in to see Rheumatology again.  ? ? ?Endoscopic History: ?- Colonoscopy (01/11/2010, Dr. Oletta Lamas): Medium size internal hemorrhoids.  Repeat in 10 years ? ? ? ?  Latest Ref Rng &  Units 09/25/2021  ?  6:36 PM 05/30/2021  ?  2:34 PM 03/05/2020  ?  4:09 AM  ?CBC  ?WBC 4.0 - 10.5 K/uL 7.6   6.9   7.8    ?Hemoglobin 12.0 - 15.0 g/dL 14.7   14.2   12.1    ?Hematocrit 36.0 - 46.0 % 46.1   43.1   37.9    ?Platelets 150 - 400 K/uL 186   207.0   193    ? ? ?  Latest Ref Rng & Units 09/25/2021  ?  6:36 PM 05/30/2021  ?  2:34 PM 07/11/2020  ?  2:42 PM  ?CMP  ?Glucose 70 - 99 mg/dL 107   87   92    ?BUN 8 - 23 mg/dL '21   18   23    '$ ?Creatinine 0.44 - 1.00 mg/dL 0.89   0.83   0.74    ?Sodium 135 - 145 mmol/L 137   139   140    ?Potassium 3.5 - 5.1 mmol/L 3.7   3.7   4.0    ?Chloride 98 - 111 mmol/L 98   101   101    ?CO2 22 - 32 mmol/L '23   30   31    '$ ?Calcium 8.9 - 10.3 mg/dL 10.1   9.8   9.7    ?Total Protein 6.5 - 8.1 g/dL 7.7   7.4     ?Total Bilirubin 0.3 - 1.2 mg/dL 0.9   0.6     ?  Alkaline Phos 38 - 126 U/L 47   64     ?AST 15 - 41 U/L 21   21     ?ALT 0 - 44 U/L <5   7     ? ? ? ?Past Medical History:  ?Diagnosis Date  ? Backache, unspecified   ? Hemiplegia, unspecified, affecting unspecified side   ? Lacunar infarction [facility])   ? Other and unspecified hyperlipidemia   ? Polymyalgia ([place])   ? Retinal defect, unspecified   ? Rotator cuff injury   ? right shoulder  ? Unspecified essential hypertension   ? Venous tributary (branch) occlusion of retina   ? ? ? ?Past Surgical History:  ?Procedure Laterality Date  ? ABDOMINAL HYSTERECTOMY    ? CHOLECYSTECTOMY    ? COLONOSCOPY  01-11-10  ? per Dr. Laurence Spates, internal hemorrhoids only, repeat in 10 yrs   ? SPHINCTEROTOMY    ? ?Family History  ?Problem Relation Age of Onset  ? Heart disease Mother   ?     died at age 18  ? Hyperlipidemia Brother   ? Hypertension Brother   ? Diabetes Brother   ? Heart disease Brother   ?     CAD/MI/CABG  ? Thyroid disease Daughter   ? ?Social History  ? ?Tobacco Use  ? Smoking status: Never  ? Smokeless tobacco: Never  ?Vaping Use  ? Vaping Use: Never used  ?Substance Use Topics  ? Alcohol use: No  ?  Alcohol/week: 0.0  standard drinks  ? Drug use: No  ? ?Current Outpatient Medications  ?Medication Sig Dispense Refill  ? atorvastatin (LIPITOR) 10 MG tablet Take 1 tablet (10 mg total) by mouth daily. 90 tablet 3  ? cephALEXin (KEFLEX) 500 MG capsule Take 1 capsule (500 mg total) by mouth 3 (three) times daily. 21 capsule 0  ? CVS VITAMIN B12 1000 MCG tablet TAKE 1 TABLET BY MOUTH EVERY DAY 90 tablet 3  ? enalapril (VASOTEC) 20 MG tablet Take 1 tablet (20 mg total) by mouth daily. 90 tablet 3  ? hydrochlorothiazide (HYDRODIURIL) 25 MG tablet Take 1 tablet (25 mg total) by mouth daily. 90 tablet 3  ? Multiple Vitamin (MULTIVITAMIN PO) Take by mouth daily.    ? ?No current facility-administered medications for this visit.  ? ?Allergies  ?Allergen Reactions  ? Amoxicillin Nausea Only  ? Meloxicam Other (See Comments)  ?  Muscle weakness   ? ? ? ?Review of Systems: ?All systems reviewed and negative except where noted in HPI.  ? ? ? ?Physical Exam:   ? ?Wt Readings from Last 3 Encounters:  ?11/02/21 137 lb (62.1 kg)  ?10/04/21 135 lb (61.2 kg)  ?09/25/21 137 lb (62.1 kg)  ? ? ?Ht '5\' 2"'$  (1.575 m)   Wt 137 lb (62.1 kg)   BMI 25.06 kg/m?  ?Constitutional:  Pleasant, in no acute distress. ?Psychiatric: Normal mood and affect. Behavior is normal. ?Cardiovascular: Normal rate, regular rhythm. No edema ?Pulmonary/chest: Effort normal and breath sounds normal. No wheezing, rales or rhonchi. ?Abdominal: Soft, nondistended, nontender. Bowel sounds active throughout. There are no masses palpable. No hepatomegaly. ?Neurological: Alert and oriented to person place and time. ?Skin: Skin is warm and dry. No rashes noted. ? ? ?ASSESSMENT AND PLAN;  ? ?1) Dysphagia ?2) Chest pain (noncardiac) ?- Discussed with full DDx for presenting symptoms.  The dysphagia and episodic chest pain are independent of each other. ?- Chest pain could be related to underlying polymyalgia rheumatica, possibly component  of oropharyngeal dysphagia, but also discussed  possibility of esophageal stricture, dysmotility ?- Start with EGD to evaluate for fecal/luminal pathology with dilation and biopsies as appropriate ?- If EGD unrevealing, low threshold to proceed with Esophageal Manometry ? ?3) Change in bowel habits ?4) Constipation ?- MiraLAX 1 cap/day and titrate to soft stools without straining to have BM ?- Increase water intake with a goal of 64 ounces/day ?- Colonoscopy to evaluate for mucosal/luminal pathology ? ?5) Colon cancer screening ?- Due for ongoing CRC screening ?- Scheduling colonoscopy as above ? ?6) Polymyalgia rheumatica ?- Discussed potential overlap between PMR and GI tract ?- Has referral pending to Rheumatology ? ?The indications, risks, and benefits of EGD and colonoscopy were explained to the patient in detail. Risks include but are not limited to bleeding, perforation, adverse reaction to medications, and cardiopulmonary compromise. Sequelae include but are not limited to the possibility of surgery, hospitalization, and mortality. The patient verbalized understanding and wished to proceed. All questions answered, referred to scheduler and bowel prep ordered. Further recommendations pending results of the exam.  ? ? ? ?Lavena Bullion, DO, FACG  11/02/2021, 10:50 AM ? ? ?Laurey Morale, MD ? ?

## 2021-11-10 ENCOUNTER — Encounter: Payer: Self-pay | Admitting: Gastroenterology

## 2021-11-12 ENCOUNTER — Encounter: Payer: Self-pay | Admitting: Certified Registered Nurse Anesthetist

## 2021-11-15 ENCOUNTER — Ambulatory Visit: Payer: Medicare HMO | Admitting: Cardiology

## 2021-11-15 ENCOUNTER — Encounter: Payer: Self-pay | Admitting: Cardiology

## 2021-11-15 VITALS — BP 162/78 | HR 76 | Ht 62.0 in | Wt 134.0 lb

## 2021-11-15 DIAGNOSIS — I10 Essential (primary) hypertension: Secondary | ICD-10-CM | POA: Diagnosis not present

## 2021-11-15 DIAGNOSIS — I700 Atherosclerosis of aorta: Secondary | ICD-10-CM

## 2021-11-15 DIAGNOSIS — I2510 Atherosclerotic heart disease of native coronary artery without angina pectoris: Secondary | ICD-10-CM

## 2021-11-15 DIAGNOSIS — E785 Hyperlipidemia, unspecified: Secondary | ICD-10-CM

## 2021-11-15 DIAGNOSIS — I471 Supraventricular tachycardia: Secondary | ICD-10-CM

## 2021-11-15 MED ORDER — ROSUVASTATIN CALCIUM 20 MG PO TABS: 20.0000 mg | ORAL_TABLET | Freq: Every day | ORAL | 3 refills | Status: DC
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
## 2021-11-15 NOTE — Addendum Note (Signed)
Addended by: Antonieta Iba on: 11/15/2021 10:13 AM ? ? Modules accepted: Orders ? ?

## 2021-11-15 NOTE — Patient Instructions (Signed)
Medication Instructions:  ?Your physician has recommended you make the following change in your medication:  ?1) START taking Crestor (rosuvastatin) 20 mg daily ? ?*If you need a refill on your cardiac medications before your next appointment, please call your pharmacy* ? ?Lab Work: ?Fasting lipids and ALT in 6 weeks ?If you have labs (blood work) drawn today and your tests are completely normal, you will receive your results only by: ?MyChart Message (if you have MyChart) OR ?A paper copy in the mail ?If you have any lab test that is abnormal or we need to change your treatment, we will call you to review the results. ? ?Testing/Procedures: ?Your physician has requested that you have a calcium score CT scan. There is a $99 fee for the scan.  ? ?Follow-Up: ?At [facility], you and your health needs are our priority.  As part of our continuing mission to provide you with exceptional heart care, we have created designated Provider Care Teams.  These Care Teams include your primary Cardiologist (physician) and Advanced Practice Providers (APPs -  Physician Assistants and Nurse Practitioners) who all work together to provide you with the care you need, when you need it.  ? ?Your next appointment:   ?1 year(s) ? ?The format for your next appointment:   ?In Person ? ?Provider:   ?Fransico Him, MD   ? ? ? ?Important Information About Sugar ? ? ? ? ?  ?

## 2021-11-17 ENCOUNTER — Ambulatory Visit (AMBULATORY_SURGERY_CENTER): Payer: Medicare HMO | Admitting: Gastroenterology

## 2021-11-17 ENCOUNTER — Encounter: Payer: Self-pay | Admitting: Gastroenterology

## 2021-11-17 VITALS — BP 107/65 | HR 79 | Temp 97.1°F | Resp 14 | Ht 62.0 in | Wt 137.0 lb

## 2021-11-17 DIAGNOSIS — B9681 Helicobacter pylori [H. pylori] as the cause of diseases classified elsewhere: Secondary | ICD-10-CM

## 2021-11-17 DIAGNOSIS — K640 First degree hemorrhoids: Secondary | ICD-10-CM | POA: Diagnosis not present

## 2021-11-17 DIAGNOSIS — R0789 Other chest pain: Secondary | ICD-10-CM

## 2021-11-17 DIAGNOSIS — D124 Benign neoplasm of descending colon: Secondary | ICD-10-CM

## 2021-11-17 DIAGNOSIS — Z1211 Encounter for screening for malignant neoplasm of colon: Secondary | ICD-10-CM

## 2021-11-17 DIAGNOSIS — K5520 Angiodysplasia of colon without hemorrhage: Secondary | ICD-10-CM

## 2021-11-17 DIAGNOSIS — K2970 Gastritis, unspecified, without bleeding: Secondary | ICD-10-CM | POA: Diagnosis not present

## 2021-11-17 DIAGNOSIS — K5900 Constipation, unspecified: Secondary | ICD-10-CM

## 2021-11-17 DIAGNOSIS — K5730 Diverticulosis of large intestine without perforation or abscess without bleeding: Secondary | ICD-10-CM

## 2021-11-17 DIAGNOSIS — R194 Change in bowel habit: Secondary | ICD-10-CM

## 2021-11-17 DIAGNOSIS — R195 Other fecal abnormalities: Secondary | ICD-10-CM

## 2021-11-17 DIAGNOSIS — R1310 Dysphagia, unspecified: Secondary | ICD-10-CM

## 2021-11-17 DIAGNOSIS — R69 Illness, unspecified: Secondary | ICD-10-CM | POA: Diagnosis not present

## 2021-11-17 DIAGNOSIS — K2950 Unspecified chronic gastritis without bleeding: Secondary | ICD-10-CM | POA: Diagnosis not present

## 2021-11-17 HISTORY — PX: ESOPHAGOGASTRODUODENOSCOPY: SHX1529

## 2021-11-17 HISTORY — PX: COLONOSCOPY: SHX174

## 2021-11-17 MED ORDER — SODIUM CHLORIDE 0.9 % IV SOLN: 500.0000 mL | Freq: Once | INTRAVENOUS | Status: DC

## 2021-11-17 NOTE — Patient Instructions (Signed)
Handout on polyps, diverticulosis, hemorrhoids  provided  ? ?Await pathology results.  ? ?Continue current medications.  ? ?YOU HAD AN ENDOSCOPIC PROCEDURE TODAY AT [facility]:   Refer to the procedure report that was given to you for any specific questions about what was found during the examination.  If the procedure report does not answer your questions, please call your gastroenterologist to clarify.  If you requested that your care partner not be given the details of your procedure findings, then the procedure report has been included in a sealed envelope for you to review at your convenience later. ? ?YOU SHOULD EXPECT: Some feelings of bloating in the abdomen. Passage of more gas than usual.  Walking can help get rid of the air that was put into your GI tract during the procedure and reduce the bloating. If you had a lower endoscopy (such as a colonoscopy or flexible sigmoidoscopy) you may notice spotting of blood in your stool or on the toilet paper. If you underwent a bowel prep for your procedure, you may not have a normal bowel movement for a few days. ? ?Please Note:  You might notice some irritation and congestion in your nose or some drainage.  This is from the oxygen used during your procedure.  There is no need for concern and it should clear up in a day or so. ? ?SYMPTOMS TO REPORT IMMEDIATELY: ? ?Following lower endoscopy (colonoscopy or flexible sigmoidoscopy): ? Excessive amounts of blood in the stool ? Significant tenderness or worsening of abdominal pains ? Swelling of the abdomen that is new, acute ? Fever of 100?F or higher ? ?Following upper endoscopy (EGD) ? Vomiting of blood or coffee ground material ? New chest pain or pain under the shoulder blades ? Painful or persistently difficult swallowing ? New shortness of breath ? Fever of 100?F or higher ? Black, tarry-looking stools ? ?For urgent or emergent issues, a gastroenterologist can be reached at any hour by calling  [phone]. ?Do not use MyChart messaging for urgent concerns.  ? ? ?DIET:  We do recommend a small meal at first, but then you may proceed to your regular diet.  Drink plenty of fluids but you should avoid alcoholic beverages for 24 hours. ? ?ACTIVITY:  You should plan to take it easy for the rest of today and you should NOT DRIVE or use heavy machinery until tomorrow (because of the sedation medicines used during the test).   ? ?FOLLOW UP: ?Our staff will call the number listed on your records 48-72 hours following your procedure to check on you and address any questions or concerns that you may have regarding the information given to you following your procedure. If we do not reach you, we will leave a message.  We will attempt to reach you two times.  During this call, we will ask if you have developed any symptoms of COVID 19. If you develop any symptoms (ie: fever, flu-like symptoms, shortness of breath, cough etc.) before then, please call [phone].  If you test positive for Covid 19 in the 2 weeks post procedure, please call and report this information to [place].   ? ?If any biopsies were taken you will be contacted by phone or by letter within the next 1-3 weeks.  Please call us at [phone] if you have not heard about the biopsies in 3 weeks.  ? ? ?SIGNATURES/CONFIDENTIALITY: ?You and/or your care partner have signed paperwork which will be entered into  your electronic medical record.  These signatures attest to the fact that that the information above on your After Visit Summary has been reviewed and is understood.  Full responsibility of the confidentiality of this discharge information lies with you and/or your care-partner. ? ? ?

## 2021-11-17 NOTE — Progress Notes (Signed)
1453 Robinul 0.1 mg IV given due large amount of secretions upon assessment.  MD made aware, vss  

## 2021-11-17 NOTE — Progress Notes (Signed)
Called to room to assist during endoscopic procedure.  Patient ID and intended procedure confirmed with present staff. Received instructions for my participation in the procedure from the performing physician.  

## 2021-11-17 NOTE — Op Note (Signed)
[place] ?Patient Name: [name] ?Procedure Date: 11/17/2021 2:30 PM ?MRN: [id number] ?Endoscopist: Gerrit Heck , MD ?Age: 76 ?Referring MD:  ?Date of Birth: [birth year] ?Gender: Female ?Account #: [id number] ?Procedure:                Colonoscopy ?Indications:              Screening for colorectal malignant neoplasm (last  ?                          colonoscopy was more than 10 years ago) ?                          Additionally, she has recent change in bowel habits  ?                          including constipation ?                          Last Colonoscopy was 01/11/2010 and notable for  ?                          medium size internal hemorrhoids. Repeat in 10 years ?Medicines:                Monitored Anesthesia Care ?Procedure:                Pre-Anesthesia Assessment: ?                          - Prior to the procedure, a History and Physical  ?                          was performed, and patient medications and  ?                          allergies were reviewed. The patient's tolerance of  ?                          previous anesthesia was also reviewed. The risks  ?                          and benefits of the procedure and the sedation  ?                          options and risks were discussed with the patient.  ?                          All questions were answered, and informed consent  ?                          was obtained. Prior Anticoagulants: The patient has  ?                          taken no previous anticoagulant or antiplatelet  ?  agents. ASA Grade Assessment: II - A patient with  ?                          mild systemic disease. After reviewing the risks  ?                          and benefits, the patient was deemed in  ?                          satisfactory condition to undergo the procedure. ?                          After obtaining informed consent, the colonoscope  ?                          was passed under direct vision. Throughout the   ?                          procedure, the patient's blood pressure, pulse, and  ?                          oxygen saturations were monitored continuously. The  ?                          CF [id number] #[id number] was introduced through the anus  ?                          and advanced to the the cecum, identified by  ?                          appendiceal orifice and ileocecal valve. The  ?                          colonoscopy was performed without difficulty. The  ?                          patient tolerated the procedure well. The quality  ?                          of the bowel preparation was good. The ileocecal  ?                          valve, appendiceal orifice, and rectum were  ?                          photographed. ?Scope In: 3:03:50 PM ?Scope Out: 3:18:25 PM ?Scope Withdrawal Time: 0 hours 9 minutes 22 seconds  ?Total Procedure Duration: 0 hours 14 minutes 35 seconds  ?Findings:                 The perianal and digital rectal examinations were  ?                          normal. ?  A 4 mm polyp was found in the descending colon. The  ?                          polyp was sessile. The polyp was removed with a  ?                          cold snare. Resection and retrieval were complete.  ?                          Estimated blood loss was minimal. ?                          Two small angioectasias without bleeding were found  ?                          in the ascending colon. ?                          Multiple small-mouthed diverticula were found in  ?                          the sigmoid colon. Otherwise normal appearing  ?                          colon. No areas of mucosal erythema, edema,  ?                          erosions, ulceration and no stricture or luminal  ?                          narrowing. ?                          Non-bleeding internal hemorrhoids were found during  ?                          retroflexion. The hemorrhoids were small. ?Complications:            No  immediate complications. ?Estimated Blood Loss:     Estimated blood loss was minimal. ?Impression:               - One 4 mm polyp in the descending colon, removed  ?                          with a cold snare. Resected and retrieved. ?                          - Two non-bleeding colonic angioectasias. ?                          - Diverticulosis in the sigmoid colon. ?                          - Non-bleeding internal hemorrhoids. ?Recommendation:           - Patient has a contact number available  for  ?                          emergencies. The signs and symptoms of potential  ?                          delayed complications were discussed with the  ?                          patient. Return to normal activities tomorrow.  ?                          Written discharge instructions were provided to the  ?                          patient. ?                          - Resume previous diet. ?                          - Continue present medications. ?                          - Await pathology results. ?                          - Repeat colonoscopy for surveillance based on  ?                          pathology results. ?                          - Return to GI clinic PRN. ?Gerrit Heck, MD ?11/17/2021 3:29:09 PM ?

## 2021-11-17 NOTE — Progress Notes (Signed)
Pt had questions for Dr. Bryan Lemma pre signing consent.  Dr. Bryan Lemma came to the bedside in admitting and answered pt's questions and satisfied pt's concerns.  maw ?

## 2021-11-17 NOTE — Progress Notes (Signed)
Report given to PACU, vss 
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
## 2021-11-17 NOTE — Op Note (Signed)
[place] ?Patient Name: [name] ?Procedure Date: 11/17/2021 2:44 PM ?MRN: [id number] ?Endoscopist: Gerrit Heck , MD ?Age: 76 ?Referring MD:  ?Date of Birth: [birth year] ?Gender: Female ?Account #: [id number] ?Procedure:                Upper GI endoscopy ?Indications:              Dysphagia, Chest pain (non cardiac) ?Medicines:                Monitored Anesthesia Care ?Procedure:                Pre-Anesthesia Assessment: ?                          - Prior to the procedure, a History and Physical  ?                          was performed, and patient medications and  ?                          allergies were reviewed. The patient's tolerance of  ?                          previous anesthesia was also reviewed. The risks  ?                          and benefits of the procedure and the sedation  ?                          options and risks were discussed with the patient.  ?                          All questions were answered, and informed consent  ?                          was obtained. Prior Anticoagulants: The patient has  ?                          taken no previous anticoagulant or antiplatelet  ?                          agents. ASA Grade Assessment: II - A patient with  ?                          mild systemic disease. After reviewing the risks  ?                          and benefits, the patient was deemed in  ?                          satisfactory condition to undergo the procedure. ?                          After obtaining informed consent, the endoscope was  ?  passed under direct vision. Throughout the  ?                          procedure, the patient's blood pressure, pulse, and  ?                          oxygen saturations were monitored continuously. The  ?                          Endoscope was introduced through the mouth, and  ?                          advanced to the second part of duodenum. The upper  ?                          GI endoscopy was  accomplished without difficulty.  ?                          The patient tolerated the procedure well. ?Scope In: ?Scope Out: ?Findings:                 The examined esophagus was normal. ?                          The Z-line was regular and was found 35 cm from the  ?                          incisors. ?                          Scattered mild inflammation characterized by  ?                          erythema was found in the gastric body and in the  ?                          gastric antrum. Biopsies were taken with a cold  ?                          forceps for Helicobacter pylori testing. Estimated  ?                          blood loss was minimal. ?                          The examined duodenum was normal. Biopsies were  ?                          taken with a cold forceps for histology. Estimated  ?                          blood loss was minimal. ?Complications:            No immediate complications. ?Estimated Blood Loss:     Estimated blood loss was minimal. ?Impression:               -  Normal esophagus. ?                          - Z-line regular, 35 cm from the incisors. ?                          - Gastritis. Biopsied. ?                          - Normal examined duodenum. Biopsied. ?Recommendation:           - Patient has a contact number available for  ?                          emergencies. The signs and symptoms of potential  ?                          delayed complications were discussed with the  ?                          patient. Return to normal activities tomorrow.  ?                          Written discharge instructions were provided to the  ?                          patient. ?                          - Resume previous diet. ?                          - Continue present medications. ?                          - Await pathology results. ?                          - Repeat upper endoscopy PRN. ?                          - Perform routine esophageal manometry at  ?                           appointment to be scheduled. ?                          - Colonoscopy today. ?Gerrit Heck, MD ?11/17/2021 3:23:11 PM ?

## 2021-11-21 ENCOUNTER — Telehealth: Payer: Self-pay | Admitting: *Deleted

## 2021-11-21 NOTE — Telephone Encounter (Signed)
?  Follow up Call- ? ? ?  11/17/2021  ?  2:18 PM  ?Call back number  ?Post procedure Call Back phone  # [phone] hm  ?Permission to leave phone message Yes  ?  ? ?Patient questions: ? ?Do you have a fever, pain , or abdominal swelling? No. ?Pain Score  0 * ? ?Have you tolerated food without any problems? Yes.   ? ?Have you been able to return to your normal activities? Yes.   ? ?Do you have any questions about your discharge instructions: ?Diet   No. ?Medications  No. ?Follow up visit  No. ? ?Do you have questions or concerns about your Care? No. ? ?Actions: ?* If pain score is 4 or above: ?No action needed, pain <4. ? ? ?

## 2021-11-22 ENCOUNTER — Other Ambulatory Visit: Payer: Self-pay

## 2021-11-22 ENCOUNTER — Telehealth: Payer: Self-pay

## 2021-11-22 DIAGNOSIS — R1310 Dysphagia, unspecified: Secondary | ICD-10-CM

## 2021-11-22 NOTE — Telephone Encounter (Addendum)
Pt scheduled for esophageal manometry on 03/28/22 at 8:30 am as requested on 11/17/21 upper endoscopy report. Ambulatory referral to GI placed. Left voicemail for pt to call back.  ?

## 2021-11-22 NOTE — Telephone Encounter (Signed)
Spoke with pt and let her know about appt date and time and pt stated she was not sure if she wanted to proceed with the test. Let pt know I would send her information about the esophageal manometry test and if she wanted to cancel she could call us back. Pt verbalized understanding and requested that appt time be moved to 12:30. Called hospital scheduling and moved appt to 03/28/22 at 12:30 pm. Instructions sent to [facility] and mailed.  ?

## 2021-11-24 NOTE — Telephone Encounter (Signed)
Patient called would like to go over her results again has some more questions. ?

## 2021-11-27 ENCOUNTER — Other Ambulatory Visit: Payer: Self-pay

## 2021-11-27 MED ORDER — BISMUTH SUBSALICYLATE 262 MG PO CHEW: 524.0000 mg | CHEWABLE_TABLET | Freq: Four times a day (QID) | ORAL | 0 refills | Status: DC

## 2021-11-27 MED ORDER — DOXYCYCLINE HYCLATE 100 MG PO CAPS: 100.0000 mg | ORAL_CAPSULE | Freq: Two times a day (BID) | ORAL | 0 refills | Status: DC

## 2021-11-27 MED ORDER — METRONIDAZOLE 250 MG PO TABS: 250.0000 mg | ORAL_TABLET | Freq: Four times a day (QID) | ORAL | 0 refills | Status: DC

## 2021-11-27 MED ORDER — OMEPRAZOLE 20 MG PO CPDR: 20.0000 mg | DELAYED_RELEASE_CAPSULE | Freq: Two times a day (BID) | ORAL | 0 refills | Status: DC

## 2021-11-27 NOTE — Telephone Encounter (Signed)
Returned pt's call. See 5/5 pathology result note.  ?

## 2021-11-27 NOTE — Telephone Encounter (Addendum)
Inbound call from patient following up on message from 5/12. Please advise.  ? ?[phone]  ?

## 2021-12-18 DIAGNOSIS — I10 Essential (primary) hypertension: Secondary | ICD-10-CM | POA: Diagnosis not present

## 2021-12-18 DIAGNOSIS — R32 Unspecified urinary incontinence: Secondary | ICD-10-CM | POA: Diagnosis not present

## 2021-12-18 DIAGNOSIS — M1990 Unspecified osteoarthritis, unspecified site: Secondary | ICD-10-CM | POA: Diagnosis not present

## 2021-12-18 DIAGNOSIS — Z8249 Family history of ischemic heart disease and other diseases of the circulatory system: Secondary | ICD-10-CM | POA: Diagnosis not present

## 2021-12-18 DIAGNOSIS — Z825 Family history of asthma and other chronic lower respiratory diseases: Secondary | ICD-10-CM | POA: Diagnosis not present

## 2021-12-18 DIAGNOSIS — Z833 Family history of diabetes mellitus: Secondary | ICD-10-CM | POA: Diagnosis not present

## 2021-12-18 DIAGNOSIS — M353 Polymyalgia rheumatica: Secondary | ICD-10-CM | POA: Diagnosis not present

## 2021-12-18 DIAGNOSIS — Z008 Encounter for other general examination: Secondary | ICD-10-CM | POA: Diagnosis not present

## 2021-12-18 DIAGNOSIS — R69 Illness, unspecified: Secondary | ICD-10-CM | POA: Diagnosis not present

## 2021-12-19 ENCOUNTER — Telehealth: Payer: Self-pay

## 2021-12-19 ENCOUNTER — Telehealth: Payer: Self-pay | Admitting: Cardiology

## 2021-12-19 NOTE — Telephone Encounter (Signed)
-----   Message from Marice Potter, RN sent at 11/27/2021  9:58 AM EDT ----- Check if pt wants to keep EM appt

## 2021-12-19 NOTE — Telephone Encounter (Signed)
Spoke with pt who stated she wanted to cancel EM in September. Pt stated that she had difficulty swallowing in the past but symptoms have improved and she feels like she doesn't need an esophageal manometry procedure. Procedure canceled. Pt also wanted to know when to submit stool test for H. Pylori, let pt know we would contact her when stool test is due.

## 2021-12-19 NOTE — Telephone Encounter (Signed)
Calling with questions with her upcoming appts. Please advise

## 2021-12-19 NOTE — Telephone Encounter (Signed)
Called pt in regards to upcoming appointments. Pt wants to cancel Coronary calcium score because had a chest CT in march.  Advised pt that Coronary calcium score looks at the coronary arteries and gives a prediction for your likely hood of having a cardiac event/ heart attack.  Advised pt that the reason for OV with Dr. Radford Pax was because of abnormal chest CT.  Pt reports will keep appointment for coronary calcium score.  Pt reports has not started taking rosuvastatin.  Took a statin medication previously and it caused severe constipation pt also has PMR.  Advised pt there are different medications besides statins to help lower cholesterol.  Pt hesitate with this idea.  When asked if is willing to take medication besides statin pt indecisive.  Would like to keep [place] appointment even though has not started medication.  Advised pt that unless has made lifestyle changes lab work not warranted at this time.  Pt reports has made diet modifications and lost weight.  Pt will keep lab appointment also.  Pt would like MD input on the above.  Will route to MD.

## 2021-12-20 NOTE — Telephone Encounter (Signed)
Pt is wanting to have labs drawn this week Per pt has changed diet since last labs were done  to see if values are better .Pt does not want to take statin at this time ./cy

## 2021-12-25 ENCOUNTER — Telehealth: Payer: Self-pay | Admitting: Cardiology

## 2021-12-25 NOTE — Telephone Encounter (Signed)
I confirmed patient's appointment for 12/27/21.  All questions/concerns have been addressed.

## 2021-12-25 NOTE — Telephone Encounter (Signed)
Patient calling in with questions bout weds appt. Please advise

## 2021-12-27 ENCOUNTER — Ambulatory Visit
Admission: RE | Admit: 2021-12-27 | Discharge: 2021-12-27 | Disposition: A | Discharge status: Home / Self Care | Payer: Self-pay | Source: Ambulatory Visit | Attending: Cardiology | Admitting: Cardiology

## 2021-12-27 ENCOUNTER — Other Ambulatory Visit: Payer: Medicare HMO

## 2021-12-27 DIAGNOSIS — E785 Hyperlipidemia, unspecified: Secondary | ICD-10-CM | POA: Diagnosis not present

## 2021-12-27 DIAGNOSIS — I2510 Atherosclerotic heart disease of native coronary artery without angina pectoris: Secondary | ICD-10-CM

## 2021-12-27 DIAGNOSIS — I10 Essential (primary) hypertension: Secondary | ICD-10-CM

## 2021-12-27 DIAGNOSIS — I700 Atherosclerosis of aorta: Secondary | ICD-10-CM

## 2021-12-27 DIAGNOSIS — I471 Supraventricular tachycardia: Secondary | ICD-10-CM | POA: Diagnosis not present

## 2021-12-27 LAB — LIPID PANEL
Chol/HDL Ratio: 4 ratio (ref 0.0–4.4)
Cholesterol, Total: 232 mg/dL — ABNORMAL HIGH (ref 100–199)
HDL: 58 mg/dL (ref >39)
LDL Chol Calc (NIH): 143 mg/dL — ABNORMAL HIGH (ref 0–99)
Triglycerides: 175 mg/dL — ABNORMAL HIGH (ref 0–149)
VLDL Cholesterol Cal: 31 mg/dL (ref 5–40)

## 2021-12-27 LAB — ALT: ALT: 9 IU/L (ref 0–32)

## 2021-12-28 ENCOUNTER — Telehealth: Payer: Self-pay | Admitting: Cardiology

## 2021-12-28 NOTE — Telephone Encounter (Signed)
See lab result for documentation on this. Dr. Radford Pax made aware of call w/ pt.

## 2021-12-28 NOTE — Telephone Encounter (Signed)
Pt was returning a call for lab and CT results

## 2021-12-29 ENCOUNTER — Other Ambulatory Visit: Payer: Self-pay | Admitting: *Deleted

## 2021-12-29 DIAGNOSIS — E785 Hyperlipidemia, unspecified: Secondary | ICD-10-CM

## 2021-12-29 NOTE — Progress Notes (Signed)
Refer to Lipid Clinic per Dr Theodosia Blender verbal order

## 2021-12-30 ENCOUNTER — Encounter: Payer: Self-pay | Admitting: Cardiology

## 2021-12-30 DIAGNOSIS — R931 Abnormal findings on diagnostic imaging of heart and coronary circulation: Secondary | ICD-10-CM | POA: Insufficient documentation

## 2022-01-01 ENCOUNTER — Telehealth: Payer: Self-pay | Admitting: Cardiology

## 2022-01-01 NOTE — Telephone Encounter (Signed)
Spoke with the patient's husband who is not on her DPR. Advised to have the patient call back.

## 2022-01-01 NOTE — Telephone Encounter (Signed)
Patient's husband is returning call to discuss CT results. Please return call to [phone].

## 2022-01-01 NOTE — Telephone Encounter (Signed)
-----   Message from Nuala Alpha, LPN sent at 9/39/0300  8:23 AM EDT -----  ----- Message ----- From: Sueanne Margarita, MD Sent: 12/30/2021   2:22 PM EDT To: Laurey Morale, MD; Cv Div Ch St Triage  Elevated coronary calcium score at 82 which is 92nd percentile for age, race and sex matched controls.  Patient has been referred to lipid clinic as patient was supposed to start Crestor but did not want to.  Start aspirin 81 mg daily

## 2022-01-02 DIAGNOSIS — L821 Other seborrheic keratosis: Secondary | ICD-10-CM | POA: Diagnosis not present

## 2022-01-02 DIAGNOSIS — X32XXXA Exposure to sunlight, initial encounter: Secondary | ICD-10-CM | POA: Diagnosis not present

## 2022-01-02 DIAGNOSIS — L570 Actinic keratosis: Secondary | ICD-10-CM | POA: Diagnosis not present

## 2022-01-05 MED ORDER — ASPIRIN 81 MG PO TBEC: 81.0000 mg | DELAYED_RELEASE_TABLET | Freq: Every day | ORAL | 3 refills | Status: DC

## 2022-01-09 ENCOUNTER — Telehealth: Payer: Self-pay

## 2022-01-09 ENCOUNTER — Other Ambulatory Visit: Payer: Self-pay

## 2022-01-09 DIAGNOSIS — B9681 Helicobacter pylori [H. pylori] as the cause of diseases classified elsewhere: Secondary | ICD-10-CM

## 2022-01-15 ENCOUNTER — Other Ambulatory Visit: Payer: Medicare HMO

## 2022-01-15 DIAGNOSIS — B9681 Helicobacter pylori [H. pylori] as the cause of diseases classified elsewhere: Secondary | ICD-10-CM

## 2022-01-15 DIAGNOSIS — K2970 Gastritis, unspecified, without bleeding: Secondary | ICD-10-CM | POA: Diagnosis not present

## 2022-01-17 LAB — H. PYLORI ANTIGEN, STOOL: H pylori Ag, Stl: NEGATIVE

## 2022-01-22 ENCOUNTER — Encounter: Payer: Self-pay | Admitting: Pharmacist

## 2022-01-22 ENCOUNTER — Ambulatory Visit: Payer: Medicare HMO | Admitting: Pharmacist

## 2022-01-22 DIAGNOSIS — E785 Hyperlipidemia, unspecified: Secondary | ICD-10-CM | POA: Diagnosis not present
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
## 2022-02-01 ENCOUNTER — Telehealth: Payer: Self-pay | Admitting: Family Medicine

## 2022-02-01 NOTE — Telephone Encounter (Signed)
Left message for patient to call back and schedule Medicare Annual Wellness Visit (AWV) either virtually or in office. Left  my [name] number [phone]   Last AWV  05/30/20 ; please schedule at anytime with [facility] Nurse Health Advisor 1 or 2

## 2022-02-08 ENCOUNTER — Ambulatory Visit (INDEPENDENT_AMBULATORY_CARE_PROVIDER_SITE_OTHER): Payer: Medicare HMO

## 2022-02-08 VITALS — BP 120/62 | HR 69 | Temp 98.6°F | Ht 62.0 in | Wt 130.3 lb

## 2022-02-08 DIAGNOSIS — Z0000 Encounter for general adult medical examination without abnormal findings: Secondary | ICD-10-CM
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
## 2022-02-08 NOTE — Patient Instructions (Addendum)
[name] , Thank you for taking time to come for your Medicare Wellness Visit. I appreciate your ongoing commitment to your health goals. Please review the following plan we discussed and let me know if I can assist you in the future.   These are the goals we discussed:  Goals       Exercise 150 minutes per week (moderate activity)      Will try 30 minutes 3 times a week; You can get gradually[facility]  Try the silver sneakers class for instruction on what exercises are helpful to you. Also, when accessing the fitness center; generally someone will show you what is available.  May try curves if there is one your area.        Stay healthy (pt-stated)      I want to work on staying healthy      Weight < 160 lb (72.576 kg)      Try drinking water; Diary daily x one week; weight and food intake and water intake; and exercise;  Note patterns         This is a list of the screening recommended for you and due dates:  Health Maintenance  Topic Date Due   COVID-19 Vaccine (4 - Booster for Pfizer series) 02/24/2022*   Tetanus Vaccine  05/30/2022*   Hepatitis C Screening: USPSTF Recommendation to screen - Ages 18-79 yo.  02/09/2023*   Flu Shot  02/13/2022   Colon Cancer Screening  11/17/2028   Pneumonia Vaccine  Completed   DEXA scan (bone density measurement)  Completed   Zoster (Shingles) Vaccine  Completed   HPV Vaccine  Aged Out  *Topic was postponed. The date shown is not the original due date.   Advanced directives: No   Conditions/risks identified: None   Next appointment: Follow up in one year for your annual wellness visit     Preventive Care 65 Years and Older, Female Preventive care refers to lifestyle choices and visits with your health care provider that can promote health and wellness. What does preventive care include? A yearly physical exam. This is also called an annual well check. Dental exams once or twice a year. Routine eye  exams. Ask your health care provider how often you should have your eyes checked. Personal lifestyle choices, including: Daily care of your teeth and gums. Regular physical activity. Eating a healthy diet. Avoiding tobacco and drug use. Limiting alcohol use. Practicing safe sex. Taking low-dose aspirin every day. Taking vitamin and mineral supplements as recommended by your health care provider. What happens during an annual well check? The services and screenings done by your health care provider during your annual well check will depend on your age, overall health, lifestyle risk factors, and family history of disease. Counseling  Your health care provider may ask you questions about your: Alcohol use. Tobacco use. Drug use. Emotional well-being. Home and relationship well-being. Sexual activity. Eating habits. History of falls. Memory and ability to understand (cognition). Work and work Statistician. Reproductive health. Screening  You may have the following tests or measurements: Height, weight, and BMI. Blood pressure. Lipid and cholesterol levels. These may be checked every 5 years, or more frequently if you are over 66 years old. Skin check. Lung cancer screening. You may have this screening every year starting at age 42 if you have a 30-pack-year history of smoking and currently smoke or have quit within the past 15 years. Fecal occult blood test (FOBT)  of the stool. You may have this test every year starting at age 77. Flexible sigmoidoscopy or colonoscopy. You may have a sigmoidoscopy every 5 years or a colonoscopy every 10 years starting at age 9. Hepatitis C blood test. Hepatitis B blood test. Sexually transmitted disease (STD) testing. Diabetes screening. This is done by checking your blood sugar (glucose) after you have not eaten for a while (fasting). You may have this done every 1-3 years. Bone density scan. This is done to screen for osteoporosis. You may have  this done starting at age 17. Mammogram. This may be done every 1-2 years. Talk to your health care provider about how often you should have regular mammograms. Talk with your health care provider about your test results, treatment options, and if necessary, the need for more tests. Vaccines  Your health care provider may recommend certain vaccines, such as: Influenza vaccine. This is recommended every year. Tetanus, diphtheria, and acellular pertussis (Tdap, Td) vaccine. You may need a Td booster every 10 years. Zoster vaccine. You may need this after age 51. Pneumococcal 13-valent conjugate (PCV13) vaccine. One dose is recommended after age 58. Pneumococcal polysaccharide (PPSV23) vaccine. One dose is recommended after age 42. Talk to your health care provider about which screenings and vaccines you need and how often you need them. This information is not intended to replace advice given to you by your health care provider. Make sure you discuss any questions you have with your health care provider. Document Released: 07/29/2015 Document Revised: 03/21/2016 Document Reviewed: 05/03/2015 Elsevier Interactive Patient Education  2017 [place] Prevention in the Home Falls can cause injuries. They can happen to people of all ages. There are many things you can do to make your home safe and to help prevent falls. What can I do on the outside of my home? Regularly fix the edges of walkways and driveways and fix any cracks. Remove anything that might make you trip as you walk through a door, such as a raised step or threshold. Trim any bushes or trees on the path to your home. Use bright outdoor lighting. Clear any walking paths of anything that might make someone trip, such as rocks or tools. Regularly check to see if handrails are loose or broken. Make sure that both sides of any steps have handrails. Any raised decks and porches should have guardrails on the edges. Have any leaves,  snow, or ice cleared regularly. Use sand or salt on walking paths during winter. Clean up any spills in your garage right away. This includes oil or grease spills. What can I do in the bathroom? Use night lights. Install grab bars by the toilet and in the tub and shower. Do not use towel bars as grab bars. Use non-skid mats or decals in the tub or shower. If you need to sit down in the shower, use a plastic, non-slip stool. Keep the floor dry. Clean up any water that spills on the floor as soon as it happens. Remove soap buildup in the tub or shower regularly. Attach bath mats securely with double-sided non-slip rug tape. Do not have throw rugs and other things on the floor that can make you trip. What can I do in the bedroom? Use night lights. Make sure that you have a light by your bed that is easy to reach. Do not use any sheets or blankets that are too big for your bed. They should not hang down onto the floor. Have a firm  chair that has side arms. You can use this for support while you get dressed. Do not have throw rugs and other things on the floor that can make you trip. What can I do in the kitchen? Clean up any spills right away. Avoid walking on wet floors. Keep items that you use a lot in easy-to-reach places. If you need to reach something above you, use a strong step stool that has a grab bar. Keep electrical cords out of the way. Do not use floor polish or wax that makes floors slippery. If you must use wax, use non-skid floor wax. Do not have throw rugs and other things on the floor that can make you trip. What can I do with my stairs? Do not leave any items on the stairs. Make sure that there are handrails on both sides of the stairs and use them. Fix handrails that are broken or loose. Make sure that handrails are as long as the stairways. Check any carpeting to make sure that it is firmly attached to the stairs. Fix any carpet that is loose or worn. Avoid having throw  rugs at the top or bottom of the stairs. If you do have throw rugs, attach them to the floor with carpet tape. Make sure that you have a light switch at the top of the stairs and the bottom of the stairs. If you do not have them, ask someone to add them for you. What else can I do to help prevent falls? Wear shoes that: Do not have high heels. Have rubber bottoms. Are comfortable and fit you well. Are closed at the toe. Do not wear sandals. If you use a stepladder: Make sure that it is fully opened. Do not climb a closed stepladder. Make sure that both sides of the stepladder are locked into place. Ask someone to hold it for you, if possible. Clearly mark and make sure that you can see: Any grab bars or handrails. First and last steps. Where the edge of each step is. Use tools that help you move around (mobility aids) if they are needed. These include: Canes. Walkers. Scooters. Crutches. Turn on the lights when you go into a dark area. Replace any light bulbs as soon as they burn out. Set up your furniture so you have a clear path. Avoid moving your furniture around. If any of your floors are uneven, fix them. If there are any pets around you, be aware of where they are. Review your medicines with your doctor. Some medicines can make you feel dizzy. This can increase your chance of falling. Ask your doctor what other things that you can do to help prevent falls. This information is not intended to replace advice given to you by your health care provider. Make sure you discuss any questions you have with your health care provider. Document Released: 04/28/2009 Document Revised: 12/08/2015 Document Reviewed: 08/06/2014 Elsevier Interactive Patient Education  2017 [employer].

## 2022-02-13 DIAGNOSIS — H25013 Cortical age-related cataract, bilateral: Secondary | ICD-10-CM | POA: Diagnosis not present

## 2022-02-13 DIAGNOSIS — H2511 Age-related nuclear cataract, right eye: Secondary | ICD-10-CM | POA: Diagnosis not present

## 2022-02-13 DIAGNOSIS — H18413 Arcus senilis, bilateral: Secondary | ICD-10-CM | POA: Diagnosis not present

## 2022-02-13 DIAGNOSIS — H401131 Primary open-angle glaucoma, bilateral, mild stage: Secondary | ICD-10-CM | POA: Diagnosis not present

## 2022-02-13 DIAGNOSIS — H2513 Age-related nuclear cataract, bilateral: Secondary | ICD-10-CM | POA: Diagnosis not present

## 2022-03-28 ENCOUNTER — Encounter (HOSPITAL_COMMUNITY): Payer: Self-pay

## 2022-03-28 ENCOUNTER — Ambulatory Visit (HOSPITAL_COMMUNITY): Admit: 2022-03-28 | Payer: Medicare HMO | Admitting: Gastroenterology

## 2022-03-28 SURGERY — MANOMETRY, ESOPHAGUS
Anesthesia: Choice

## 2022-04-16 DIAGNOSIS — Z6824 Body mass index (BMI) 24.0-24.9, adult: Secondary | ICD-10-CM | POA: Diagnosis not present

## 2022-04-16 DIAGNOSIS — M5030 Other cervical disc degeneration, unspecified cervical region: Secondary | ICD-10-CM | POA: Diagnosis not present

## 2022-04-16 DIAGNOSIS — M353 Polymyalgia rheumatica: Secondary | ICD-10-CM | POA: Diagnosis not present

## 2022-04-16 DIAGNOSIS — M1991 Primary osteoarthritis, unspecified site: Secondary | ICD-10-CM | POA: Diagnosis not present

## 2022-04-16 DIAGNOSIS — M7989 Other specified soft tissue disorders: Secondary | ICD-10-CM | POA: Diagnosis not present

## 2022-04-16 DIAGNOSIS — R768 Other specified abnormal immunological findings in serum: Secondary | ICD-10-CM | POA: Diagnosis not present

## 2022-04-30 DIAGNOSIS — H2511 Age-related nuclear cataract, right eye: Secondary | ICD-10-CM | POA: Diagnosis not present

## 2022-05-01 DIAGNOSIS — H2512 Age-related nuclear cataract, left eye: Secondary | ICD-10-CM | POA: Diagnosis not present

## 2022-05-08 DIAGNOSIS — H5213 Myopia, bilateral: Secondary | ICD-10-CM | POA: Diagnosis not present

## 2022-05-08 DIAGNOSIS — H2511 Age-related nuclear cataract, right eye: Secondary | ICD-10-CM | POA: Diagnosis not present

## 2022-05-08 DIAGNOSIS — H524 Presbyopia: Secondary | ICD-10-CM | POA: Diagnosis not present

## 2022-05-08 DIAGNOSIS — Z961 Presence of intraocular lens: Secondary | ICD-10-CM | POA: Diagnosis not present

## 2022-05-08 DIAGNOSIS — H52223 Regular astigmatism, bilateral: Secondary | ICD-10-CM | POA: Diagnosis not present

## 2022-05-10 DIAGNOSIS — M353 Polymyalgia rheumatica: Secondary | ICD-10-CM | POA: Diagnosis not present

## 2022-05-10 DIAGNOSIS — Z6824 Body mass index (BMI) 24.0-24.9, adult: Secondary | ICD-10-CM | POA: Diagnosis not present

## 2022-05-10 DIAGNOSIS — M25512 Pain in left shoulder: Secondary | ICD-10-CM | POA: Diagnosis not present

## 2022-05-10 DIAGNOSIS — M5030 Other cervical disc degeneration, unspecified cervical region: Secondary | ICD-10-CM | POA: Diagnosis not present

## 2022-05-10 DIAGNOSIS — M25511 Pain in right shoulder: Secondary | ICD-10-CM | POA: Diagnosis not present

## 2022-05-10 DIAGNOSIS — M1991 Primary osteoarthritis, unspecified site: Secondary | ICD-10-CM | POA: Diagnosis not present

## 2022-05-10 DIAGNOSIS — R768 Other specified abnormal immunological findings in serum: Secondary | ICD-10-CM | POA: Diagnosis not present

## 2022-05-30 DIAGNOSIS — H18413 Arcus senilis, bilateral: Secondary | ICD-10-CM | POA: Diagnosis not present

## 2022-05-30 DIAGNOSIS — I10 Essential (primary) hypertension: Secondary | ICD-10-CM | POA: Diagnosis not present

## 2022-05-30 DIAGNOSIS — H04121 Dry eye syndrome of right lacrimal gland: Secondary | ICD-10-CM | POA: Diagnosis not present

## 2022-05-30 DIAGNOSIS — H401131 Primary open-angle glaucoma, bilateral, mild stage: Secondary | ICD-10-CM | POA: Diagnosis not present

## 2022-07-03 ENCOUNTER — Ambulatory Visit: Payer: Medicare HMO | Admitting: Podiatry

## 2022-07-03 ENCOUNTER — Encounter: Payer: Self-pay | Admitting: Podiatry

## 2022-07-03 DIAGNOSIS — M79674 Pain in right toe(s): Secondary | ICD-10-CM

## 2022-07-03 DIAGNOSIS — B351 Tinea unguium: Secondary | ICD-10-CM | POA: Diagnosis not present

## 2022-07-03 DIAGNOSIS — N182 Chronic kidney disease, stage 2 (mild): Secondary | ICD-10-CM | POA: Diagnosis not present

## 2022-07-03 DIAGNOSIS — M79675 Pain in left toe(s): Secondary | ICD-10-CM

## 2022-07-03 NOTE — Progress Notes (Signed)
This patient returns to my office for at risk foot care.  This patient requires this care by a professional since this patient will be at risk due to having CKD. This patient is unable to cut nails herself since the patient cannot reach hernails.These nails are painful walking and wearing shoes.  This patient presents for at risk foot care today.  General Appearance  Alert, conversant and in no acute stress.  Vascular  Dorsalis pedis and posterior tibial  pulses are palpable  bilaterally.  Capillary return is within normal limits  bilaterally. Temperature is within normal limits  bilaterally.  Neurologic  Senn-Weinstein monofilament wire test within normal limits  bilaterally. Muscle power within normal limits bilaterally.  Nails Thick disfigured discolored nails with subungual debris  from hallux to fifth toes bilaterally. No evidence of bacterial infection or drainage bilaterally.  Orthopedic  No limitations of motion  feet .  No crepitus or effusions noted.  No bony pathology or digital deformities noted.  HAV  B/L  Skin  normotropic skin with no porokeratosis noted bilaterally.  No signs of infections or ulcers noted.     Onychomycosis  Pain in right toes  Pain in left toes  Consent was obtained for treatment procedures.   Mechanical debridement of nails 1-5  bilaterally especially her third toe right foot which is thickened  performed with a nail nipper.  Filed with dremel without incident.    Return office visit   3 months                   Told patient to return for periodic foot care and evaluation due to potential at risk complications.   Gardiner Barefoot DPM

## 2022-07-08 ENCOUNTER — Other Ambulatory Visit: Payer: Self-pay | Admitting: Family Medicine

## 2022-07-08 DIAGNOSIS — I10 Essential (primary) hypertension: Secondary | ICD-10-CM

## 2022-07-24 IMAGING — DX DG CHEST 2V
2 series · 2 of 2 positions shown · non-contrast
Comparison: 02/06/2019

CLINICAL DATA: Shortness of breath and dizziness for couple months,
history polymyalgia rheumatica

EXAM:
CHEST - 2 VIEW

[chest pa]
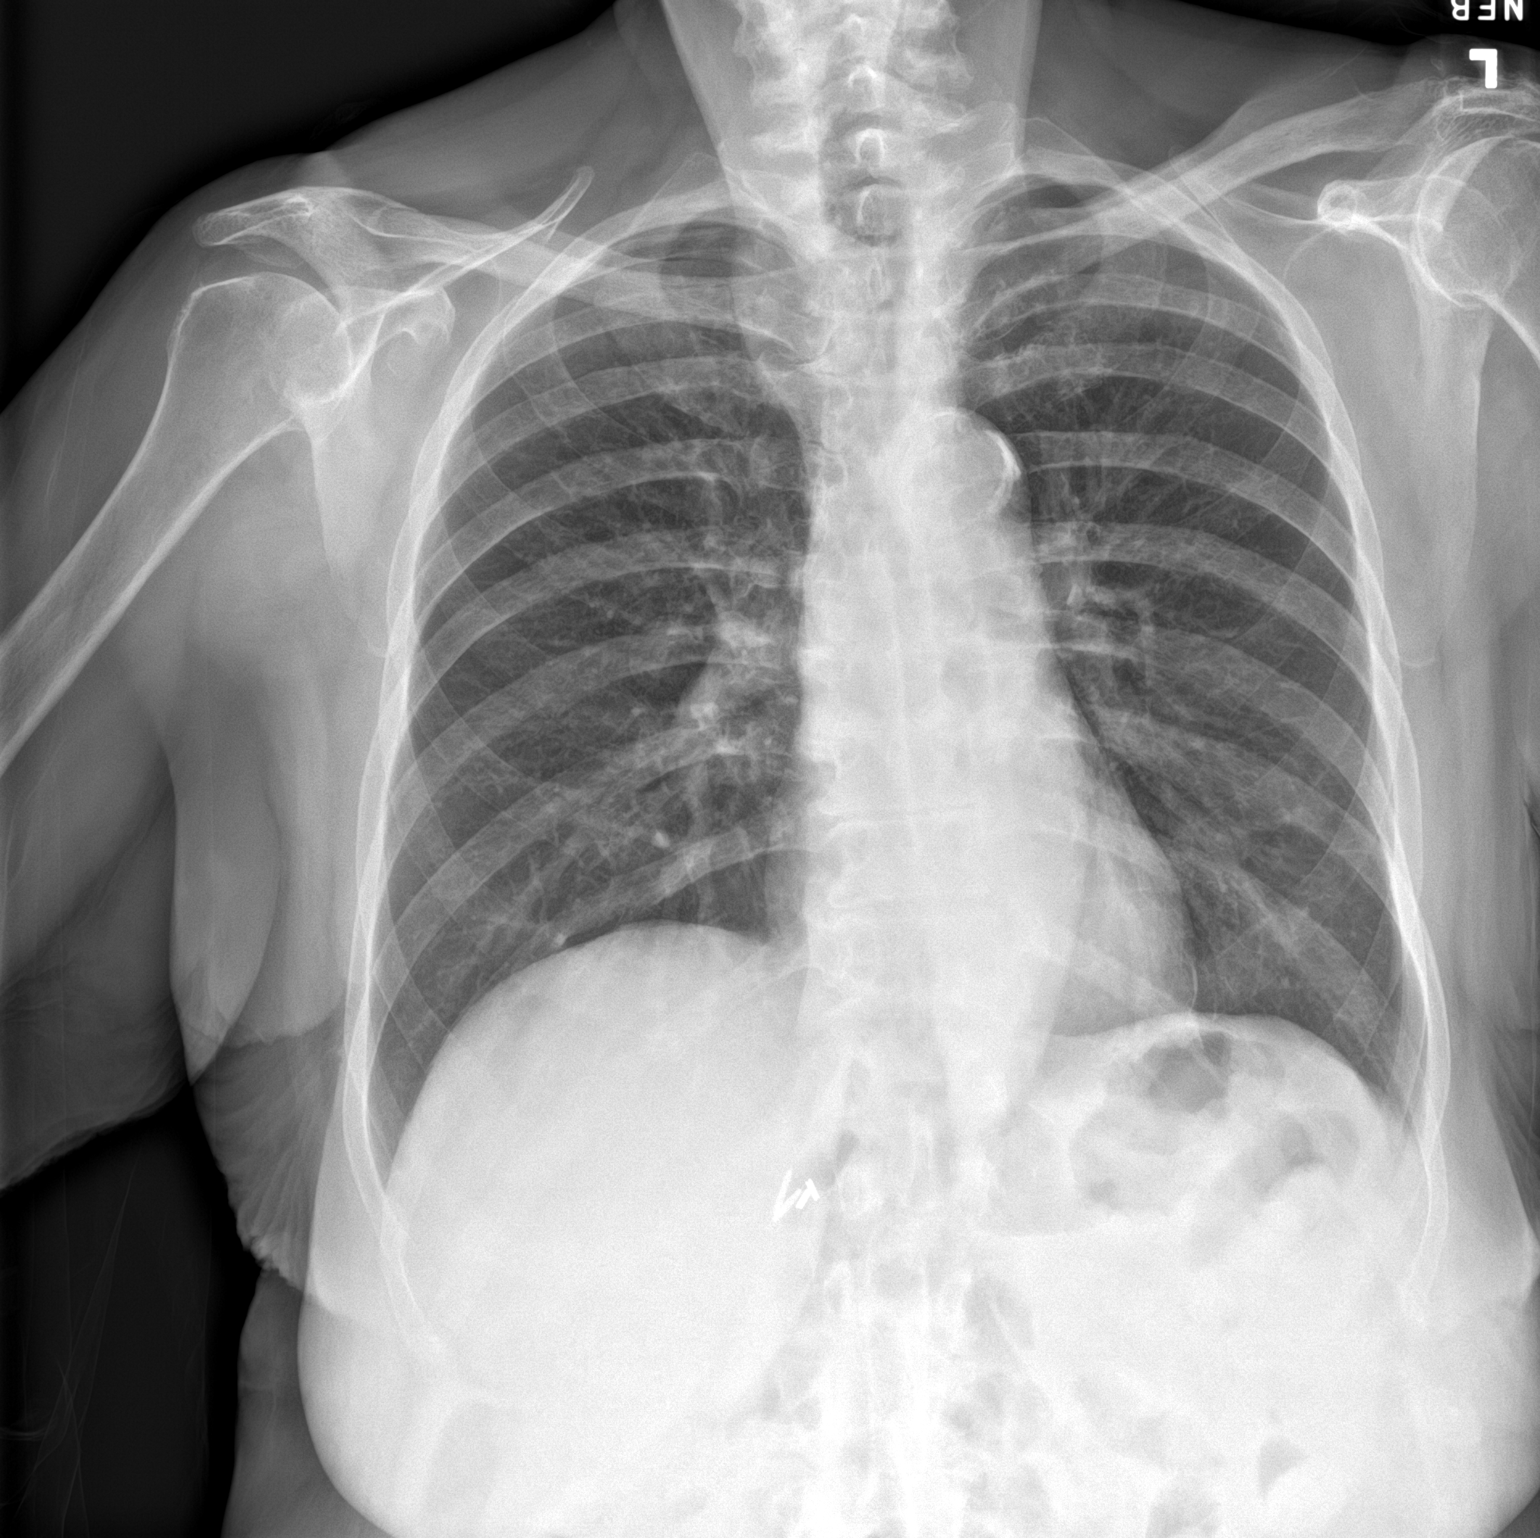

[chest lat]
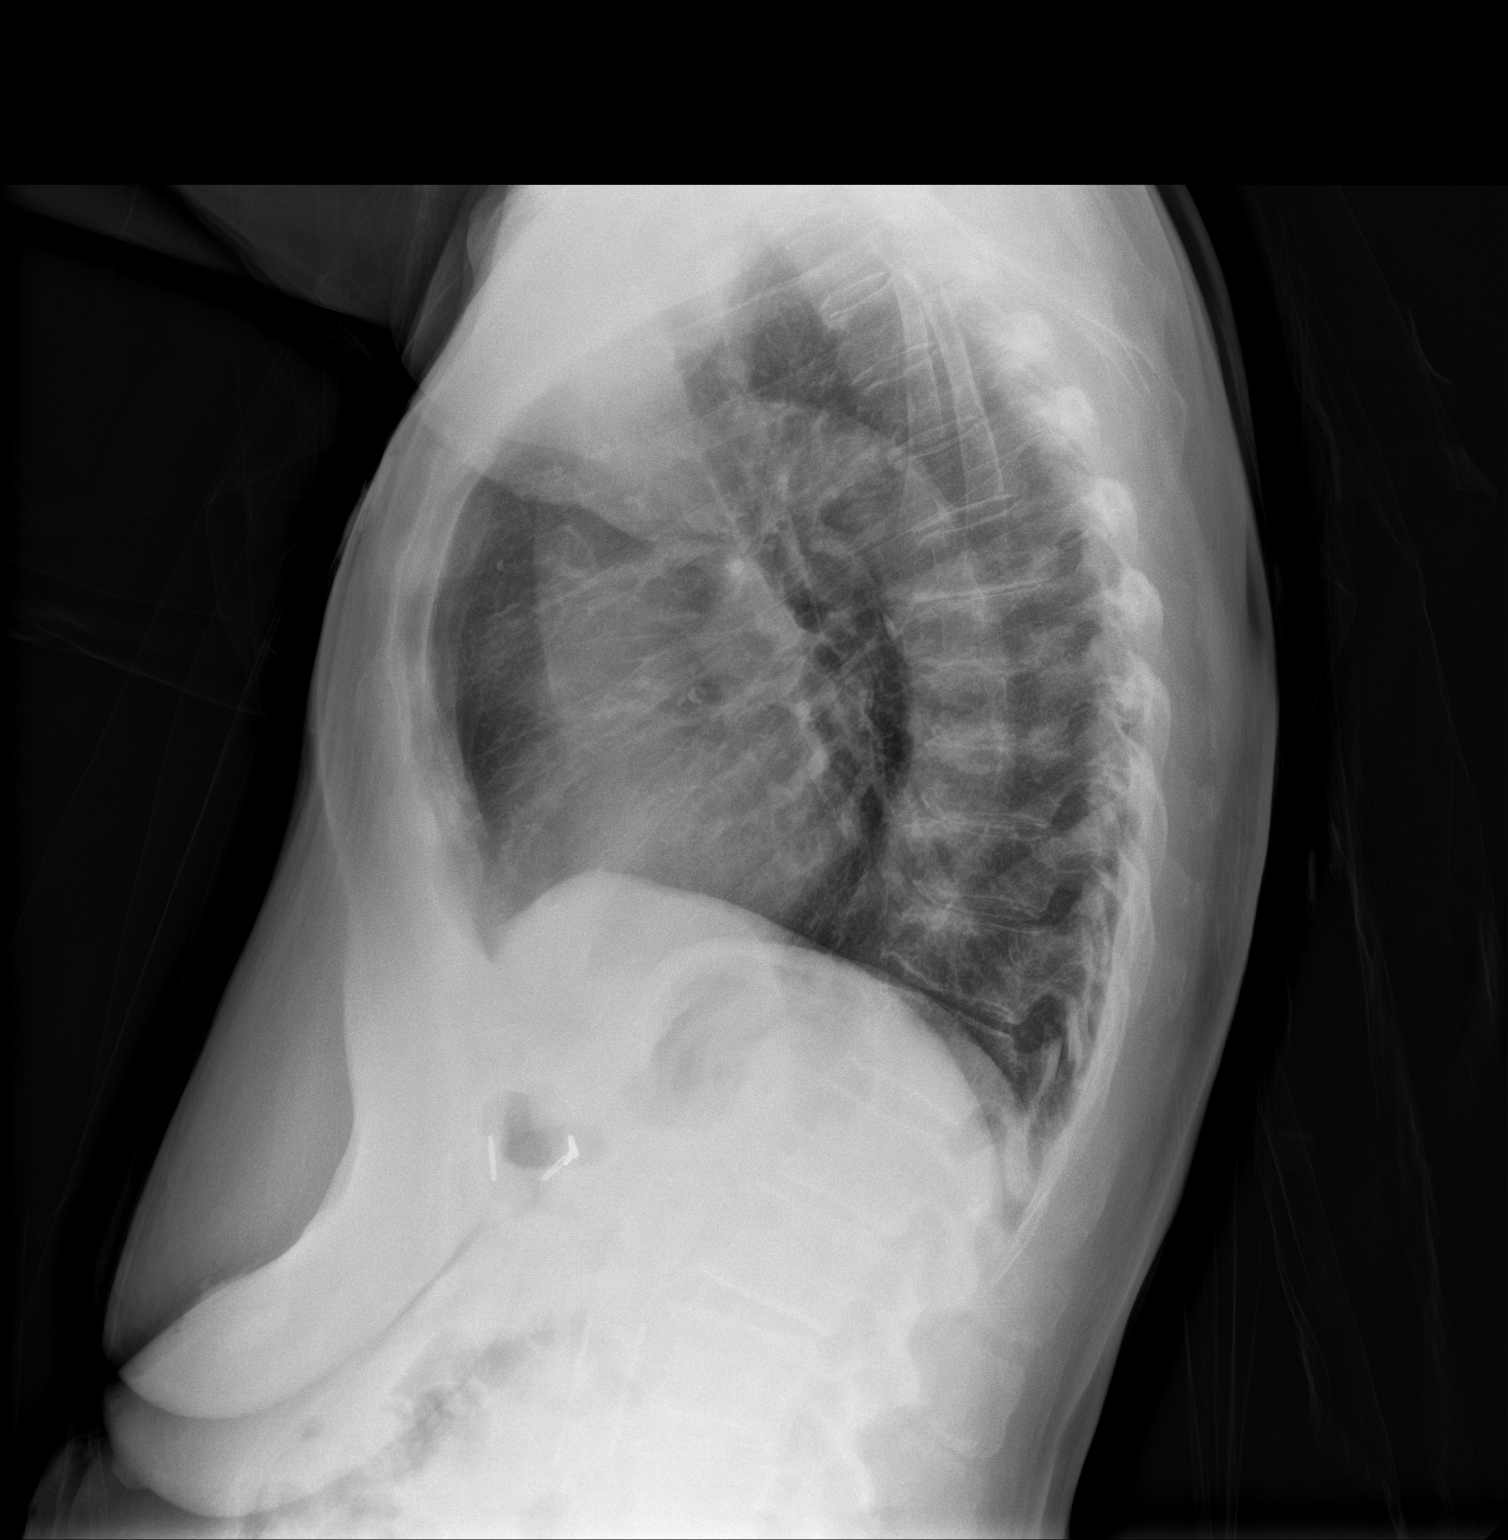

[2 of 2 positions shown; findings below may reference images not displayed]

FINDINGS: Normal heart size, mediastinal contours, and pulmonary vascularity.

Atherosclerotic calcification aorta.

Lungs clear.

No pulmonary infiltrate, pleural effusion, or pneumothorax.

Osseous demineralization with scattered mild endplate spur formation
thoracic spine.
IMPRESSION: No acute abnormalities.

Aortic Atherosclerosis ([id number].Q).

## 2022-07-24 IMAGING — CT CT ABD-PELV W/ CM
2 of 5 series · 14 of 46 positions shown, 16 images · IV contrast (agent unspecified)
Comparison: CT abdomen pelvis 02/24/2004

CLINICAL DATA: Pulmonary embolism (PE) suspected, positive D-dimer;
Abdominal pain, acute, nonlocalized



[Series 2: abd pel w · axial · 0.78mm/px · z∈[+695,+1115]mm · 11 of 96 slices shown, 13 images]
[im 6/96  soft-tissue]
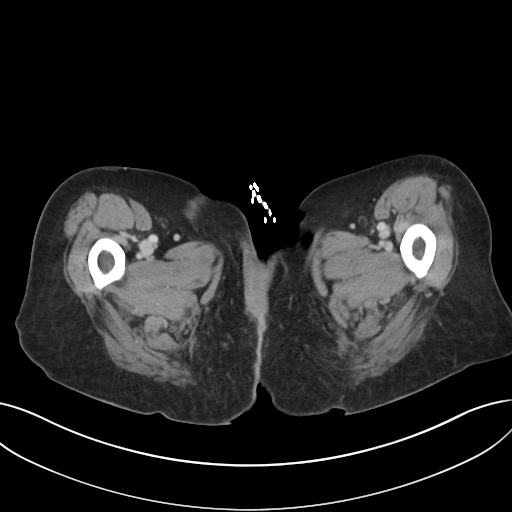
[im 6/96  bone]
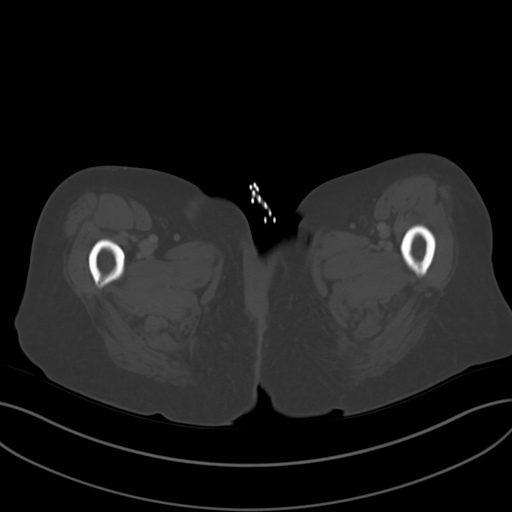
[im 16/96  soft-tissue]
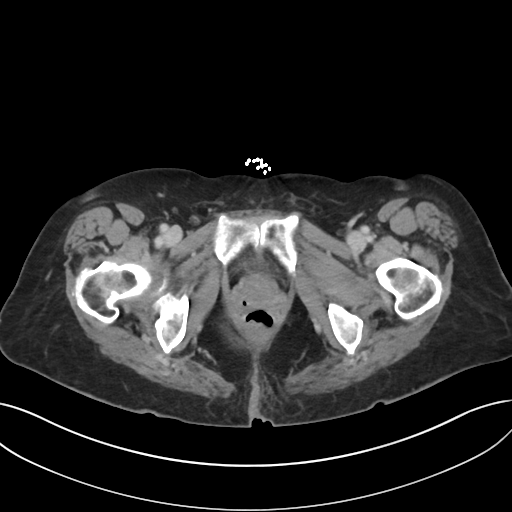
[im 22/96  soft-tissue]
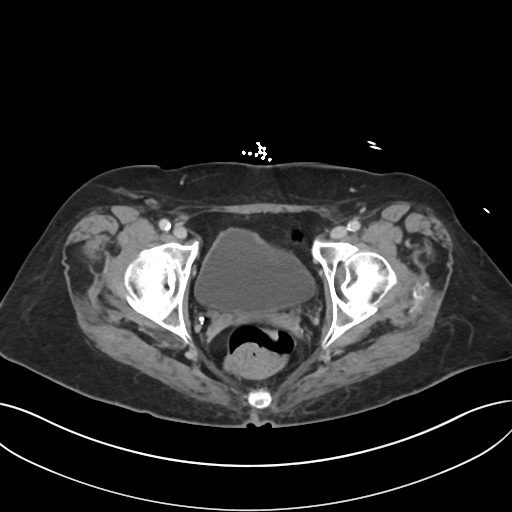
[im 32/96  soft-tissue]
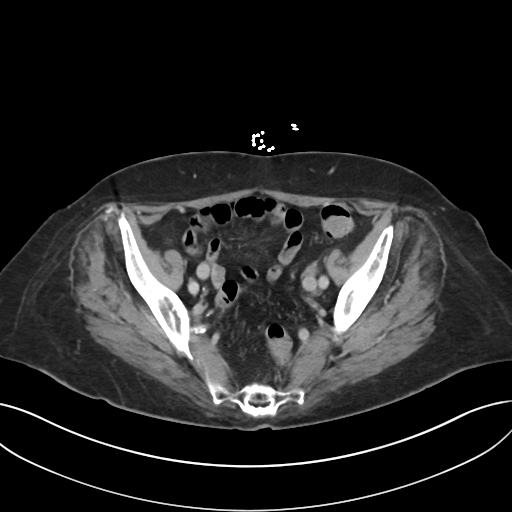
[im 37/96  soft-tissue]
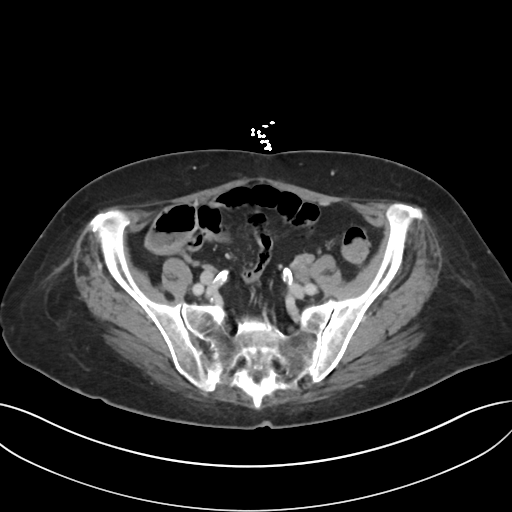
[im 48/96  soft-tissue]
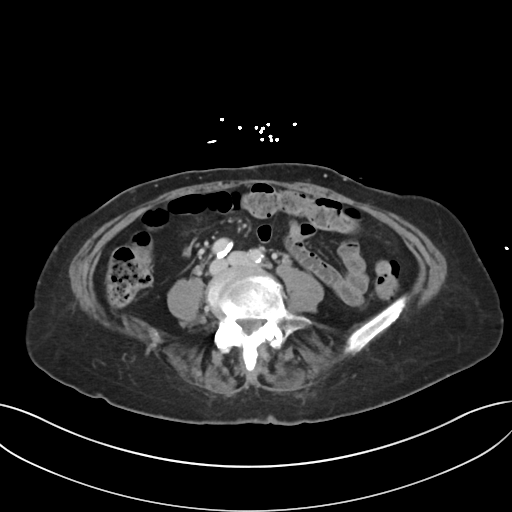
[im 59/96  soft-tissue]
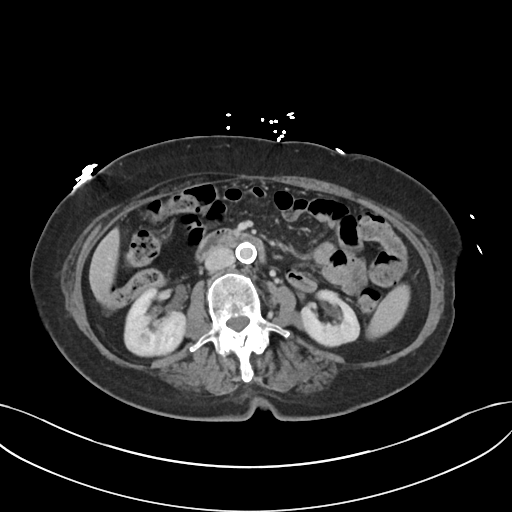
[im 64/96  soft-tissue]
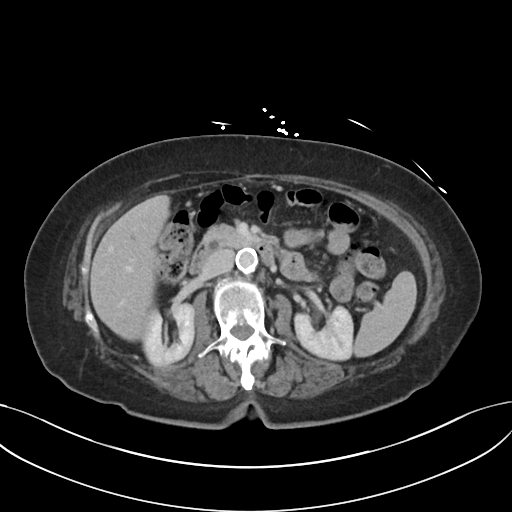
[im 74/96  soft-tissue]
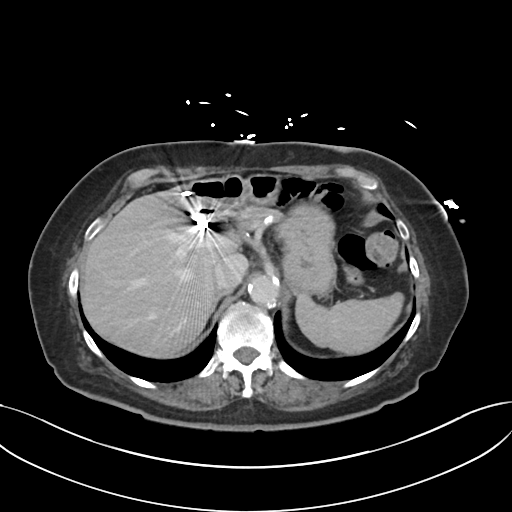
[im 74/96  bone]
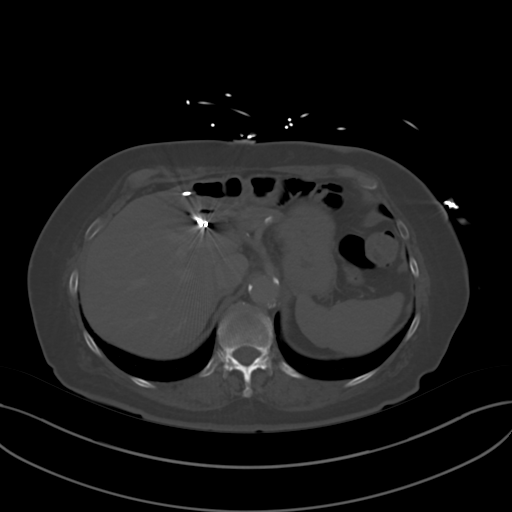
[im 80/96  soft-tissue]
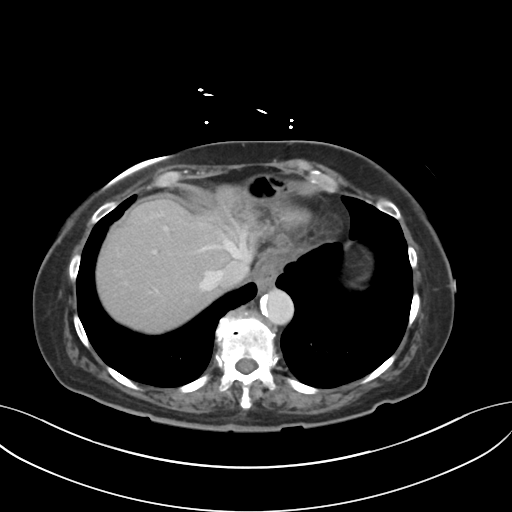
[im 90/96  soft-tissue]
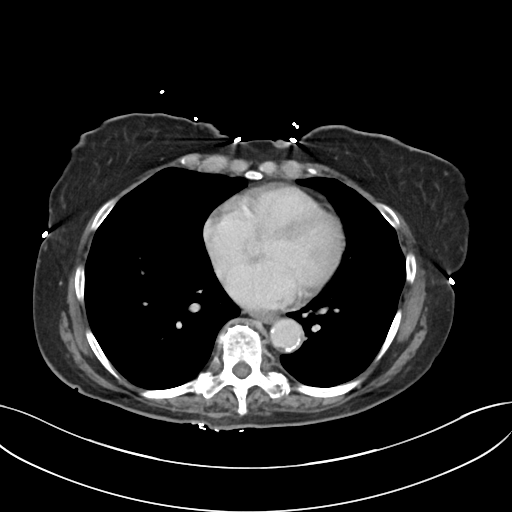

[Series 5: coronal · coronal · 0.72mm/px · 3 of 83 slices shown]
[im 28/83  soft-tissue]
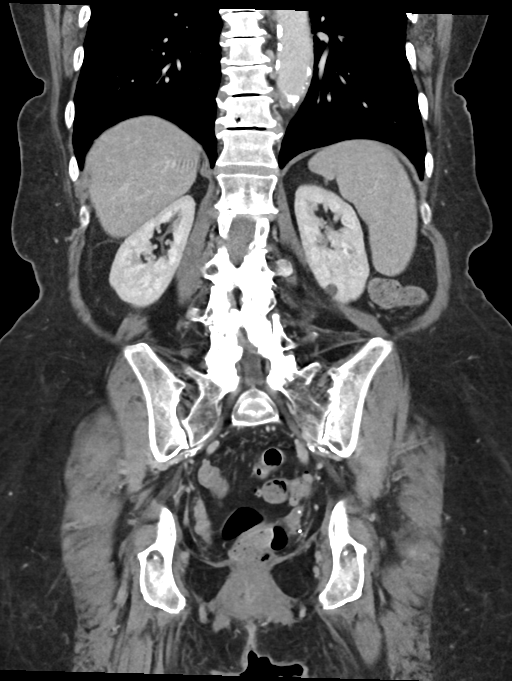
[im 37/83  soft-tissue]
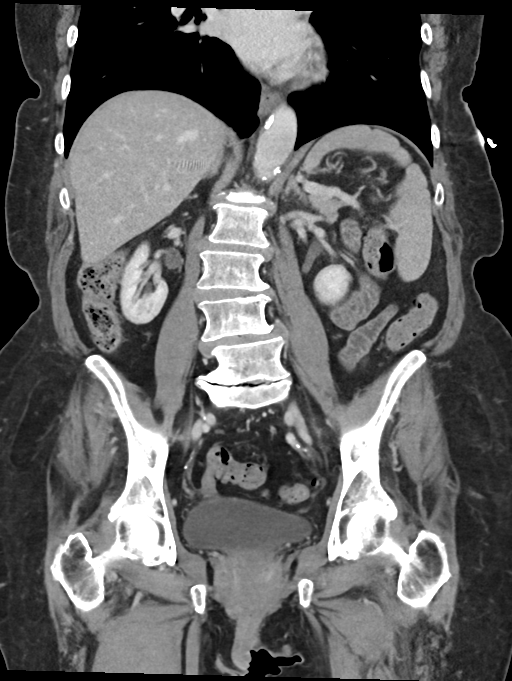
[im 46/83  soft-tissue]
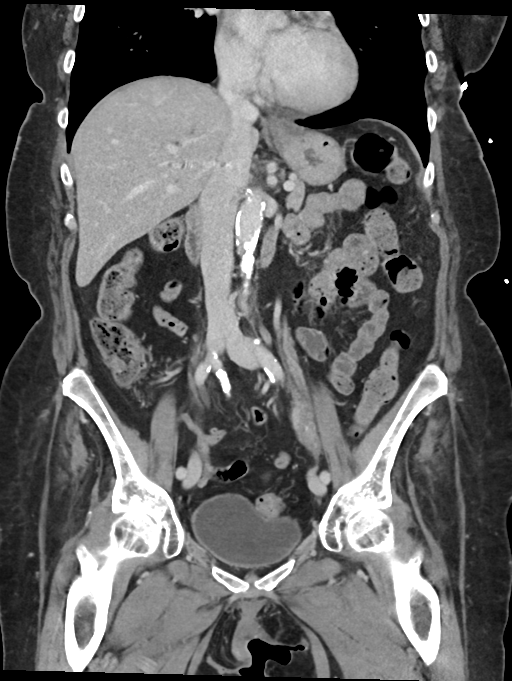

[14 of 46 positions shown; findings below may reference images not displayed]

RADIATION DOSE REDUCTION: This exam was performed according to the
departmental dose-optimization program which includes automated
exposure control, adjustment of the mA and/or kV according to
patient size and/or use of iterative reconstruction technique.

CONTRAST:  75mL OMNIPAQUE IOHEXOL 350 MG/ML SOLN
FINDINGS: CTA CHEST FINDINGS

Cardiovascular: Satisfactory opacification of the pulmonary arteries
to the segmental level. No evidence of pulmonary embolism. Normal
heart size. No significant pericardial effusion. The thoracic aorta
is normal in caliber. Severe atherosclerotic plaque of the thoracic
aorta. At least 3 vessel coronary artery calcifications.

Mediastinum/Nodes: No enlarged mediastinal, hilar, or axillary lymph
nodes. Thyroid gland, trachea, and esophagus demonstrate no
significant findings.

Lungs/Pleura: No focal consolidation. No pulmonary nodule. No
pulmonary mass. No pleural effusion. No pneumothorax.

Musculoskeletal:

No chest wall abnormality.

No suspicious lytic or blastic osseous lesions. No acute displaced
fracture. Multilevel degenerative changes of the spine. Bilateral
shoulder degenerative changes.

Review of the MIP images confirms the above findings.

CT ABDOMEN and PELVIS FINDINGS

Hepatobiliary: Query atrophic left hepatic lobe-chronic. No focal
liver abnormality. Status post cholecystectomy. No biliary
dilatation.

Pancreas: No focal lesion. Normal pancreatic contour. No surrounding
inflammatory changes. No main pancreatic ductal dilatation.

Spleen: Normal in size without focal abnormality.

Adrenals/Urinary Tract:

No adrenal nodule bilaterally.

Bilateral kidneys enhance symmetrically. Subcentimeter left
hypodensity too small to characterize.

No hydronephrosis. No hydroureter.

The urinary bladder is unremarkable.

Stomach/Bowel: Stomach is within normal limits. No evidence of bowel
wall thickening or dilatation. Diffuse sigmoid diverticulosis.
Appendix appears normal.

Vascular/Lymphatic: No abdominal aorta or iliac aneurysm. Severe
atherosclerotic plaque of the aorta and its branches. No abdominal,
pelvic, or inguinal lymphadenopathy.

Reproductive: Status post hysterectomy. No adnexal masses.

Other: No intraperitoneal free fluid. No intraperitoneal free gas.
No organized fluid collection.

Musculoskeletal:

No abdominal wall hernia or abnormality.

No suspicious lytic or blastic osseous lesions. No acute displaced
fracture. Multilevel degenerative changes of the spine. Grade 1
anterolisthesis of L4 on L5. Mild retrolisthesis of L5 on S1.
Posterior disc osteophyte complex formation at the L5-S1 level.

Review of the MIP images confirms the above findings.
IMPRESSION: 1. No pulmonary embolus.
2. No acute intrathoracic abnormality.
3. Sigmoid diverticulosis with no acute diverticulitis.
4.  Aortic Atherosclerosis ([id number]) - severe.

## 2022-07-31 ENCOUNTER — Encounter: Payer: Self-pay | Admitting: Family Medicine

## 2022-07-31 ENCOUNTER — Ambulatory Visit (INDEPENDENT_AMBULATORY_CARE_PROVIDER_SITE_OTHER): Payer: Medicare Other | Admitting: Family Medicine

## 2022-07-31 VITALS — BP 128/82 | HR 85 | Temp 97.8°F | Ht 62.0 in | Wt 135.4 lb

## 2022-07-31 DIAGNOSIS — N182 Chronic kidney disease, stage 2 (mild): Secondary | ICD-10-CM | POA: Diagnosis not present

## 2022-07-31 DIAGNOSIS — I10 Essential (primary) hypertension: Secondary | ICD-10-CM | POA: Diagnosis not present

## 2022-07-31 DIAGNOSIS — E538 Deficiency of other specified B group vitamins: Secondary | ICD-10-CM

## 2022-07-31 DIAGNOSIS — I6350 Cerebral infarction due to unspecified occlusion or stenosis of unspecified cerebral artery: Secondary | ICD-10-CM | POA: Diagnosis not present

## 2022-07-31 DIAGNOSIS — E785 Hyperlipidemia, unspecified: Secondary | ICD-10-CM

## 2022-07-31 DIAGNOSIS — R739 Hyperglycemia, unspecified: Secondary | ICD-10-CM | POA: Diagnosis not present

## 2022-07-31 DIAGNOSIS — M353 Polymyalgia rheumatica: Secondary | ICD-10-CM

## 2022-07-31 NOTE — Progress Notes (Signed)
Subjective:    Patient ID: [name], female    DOB: Aug 26, 1945, 77 y.o.   MRN: [id number]  HPI Here to follow up on issues. Her BP has been stable. She had been seeing Dr. Amil Amen for the PMR, but she says she does not plan to go back unless things get worse. She has some stiffness and pain in the shoulders and hips, but she has learned to live with it.  She had a colonoscopy in May, and she will not need any more repeats. She also had an EGD that day which showed gastritis that was found to be caused by H pylori. After this was treated, her indigestion went away. She has seen Dr. Adrian Saran for hearing loss, but she has not seen him for some time.   Review of Systems  Constitutional: Negative.   HENT:  Positive for hearing loss.   Eyes: Negative.   Respiratory: Negative.    Cardiovascular: Negative.   Gastrointestinal: Negative.   Genitourinary:  Negative for decreased urine volume, difficulty urinating, dyspareunia, dysuria, enuresis, flank pain, frequency, hematuria, pelvic pain and urgency.  Musculoskeletal:  Positive for arthralgias.  Skin: Negative.   Neurological: Negative.  Negative for headaches.  Psychiatric/Behavioral: Negative.         Objective:   Physical Exam Constitutional:      General: She is not in acute distress.    Appearance: She is well-developed.     Comments: Walks with a cane   HENT:     Head: Normocephalic and atraumatic.     Right Ear: External ear normal.     Left Ear: External ear normal.     Nose: Nose normal.     Mouth/Throat:     Pharynx: No oropharyngeal exudate.  Eyes:     General: No scleral icterus.    Conjunctiva/sclera: Conjunctivae normal.     Pupils: Pupils are equal, round, and reactive to light.  Neck:     Thyroid: No thyromegaly.     Vascular: No JVD.  Cardiovascular:     Rate and Rhythm: Normal rate and regular rhythm.     Pulses: Normal pulses.     Heart sounds: Normal heart sounds. No murmur heard.    No  friction rub. No gallop.  Pulmonary:     Effort: Pulmonary effort is normal. No respiratory distress.     Breath sounds: Normal breath sounds. No wheezing or rales.  Chest:     Chest wall: No tenderness.  Abdominal:     General: Bowel sounds are normal. There is no distension.     Palpations: Abdomen is soft. There is no mass.     Tenderness: There is no abdominal tenderness. There is no guarding or rebound.  Musculoskeletal:        General: No tenderness. Normal range of motion.     Cervical back: Normal range of motion and neck supple.  Lymphadenopathy:     Cervical: No cervical adenopathy.  Skin:    General: Skin is warm and dry.     Findings: No erythema or rash.  Neurological:     Mental Status: She is alert and oriented to person, place, and time.     Cranial Nerves: No cranial nerve deficit.     Motor: No abnormal muscle tone.     Coordination: Coordination normal.     Deep Tendon Reflexes: Reflexes are normal and symmetric. Reflexes normal.  Psychiatric:        Mood and Affect: Mood  normal.        Behavior: Behavior normal.        Thought Content: Thought content normal.        Judgment: Judgment normal.           Assessment & Plan:  Her HTN is stable. Her OA and PMR are stable. For the hearing loss, I advised her to see Dr. Constance Holster again. We will get fasting labs to check lipids, etc. We spent a total of  ( 35  ) minutes reviewing records and discussing these issues.  Alysia Penna, MD

## 2022-08-01 LAB — URINALYSIS, ROUTINE W REFLEX MICROSCOPIC
Bilirubin Urine: NEGATIVE
Leukocytes,Ua: NEGATIVE
Nitrite: NEGATIVE
Specific Gravity, Urine: >=1.03 — AB (ref 1.000–1.030)
Total Protein, Urine: NEGATIVE
Urine Glucose: NEGATIVE
Urobilinogen, UA: 0.2 (ref 0.0–1.0)
pH: 5 (ref 5.0–8.0)

## 2022-08-01 LAB — BASIC METABOLIC PANEL
BUN: 16 mg/dL (ref 6–23)
CO2: 29 mEq/L (ref 19–32)
Calcium: 9.7 mg/dL (ref 8.4–10.5)
Chloride: 99 mEq/L (ref 96–112)
Creatinine, Ser: 0.81 mg/dL (ref 0.40–1.20)
GFR: 70.67 mL/min (ref >60.00)
Glucose, Bld: 93 mg/dL (ref 70–99)
Potassium: 4.1 mEq/L (ref 3.5–5.1)
Sodium: 138 mEq/L (ref 135–145)

## 2022-08-01 LAB — HEPATIC FUNCTION PANEL
ALT: 8 U/L (ref 0–35)
AST: 19 U/L (ref 0–37)
Albumin: 4.2 g/dL (ref 3.5–5.2)
Alkaline Phosphatase: 63 U/L (ref 39–117)
Bilirubin, Direct: 0.1 mg/dL (ref 0.0–0.3)
Total Bilirubin: 0.7 mg/dL (ref 0.2–1.2)
Total Protein: 7.8 g/dL (ref 6.0–8.3)

## 2022-08-01 LAB — HEMOGLOBIN A1C: Hgb A1c MFr Bld: 5.5 % (ref 4.6–6.5)

## 2022-08-01 LAB — CBC WITH DIFFERENTIAL/PLATELET
Basophils Absolute: 0.1 10*3/uL (ref 0.0–0.1)
Basophils Relative: 1.2 % (ref 0.0–3.0)
Eosinophils Absolute: 0 10*3/uL (ref 0.0–0.7)
Eosinophils Relative: 0.2 % (ref 0.0–5.0)
HCT: 43.3 % (ref 36.0–46.0)
Hemoglobin: 14.6 g/dL (ref 12.0–15.0)
Lymphocytes Relative: 26.5 % (ref 12.0–46.0)
Lymphs Abs: 2.1 10*3/uL (ref 0.7–4.0)
MCHC: 33.9 g/dL (ref 30.0–36.0)
MCV: 87.1 fl (ref 78.0–100.0)
Monocytes Absolute: 0.6 10*3/uL (ref 0.1–1.0)
Monocytes Relative: 7.9 % (ref 3.0–12.0)
Neutro Abs: 5.1 10*3/uL (ref 1.4–7.7)
Neutrophils Relative %: 64.2 % (ref 43.0–77.0)
Platelets: 216 10*3/uL (ref 150.0–400.0)
RBC: 4.97 Mil/uL (ref 3.87–5.11)
RDW: 15.5 % (ref 11.5–15.5)
WBC: 8 10*3/uL (ref 4.0–10.5)

## 2022-08-01 LAB — LIPID PANEL
Cholesterol: 199 mg/dL (ref 0–200)
HDL: 66.5 mg/dL (ref >39.00)
LDL Cholesterol: 105 mg/dL — ABNORMAL HIGH (ref 0–99)
NonHDL: 132.48
Total CHOL/HDL Ratio: 3
Triglycerides: 136 mg/dL (ref 0.0–149.0)
VLDL: 27.2 mg/dL (ref 0.0–40.0)

## 2022-08-01 LAB — VITAMIN B12: Vitamin B-12: 343 pg/mL (ref 211–911)

## 2022-08-01 LAB — TSH: TSH: 1.8 u[IU]/mL (ref 0.35–5.50)

## 2022-08-02 ENCOUNTER — Other Ambulatory Visit: Payer: Self-pay

## 2022-08-02 DIAGNOSIS — N390 Urinary tract infection, site not specified: Secondary | ICD-10-CM

## 2022-08-07 ENCOUNTER — Other Ambulatory Visit: Payer: Medicare Other

## 2022-08-07 DIAGNOSIS — N390 Urinary tract infection, site not specified: Secondary | ICD-10-CM

## 2022-08-07 NOTE — Addendum Note (Signed)
Addended by: Octavio Manns E on: 08/07/2022 01:12 PM   Modules accepted: Orders

## 2022-08-08 LAB — URINALYSIS, ROUTINE W REFLEX MICROSCOPIC
Bacteria, UA: NONE SEEN /HPF
Bilirubin Urine: NEGATIVE
Glucose, UA: NEGATIVE
Hgb urine dipstick: NEGATIVE
Hyaline Cast: NONE SEEN /LPF
Ketones, ur: NEGATIVE
Nitrite: NEGATIVE
Protein, ur: NEGATIVE
RBC / HPF: NONE SEEN /HPF (ref 0–2)
Specific Gravity, Urine: 1.01 (ref 1.001–1.035)
Squamous Epithelial / HPF: NONE SEEN /HPF (ref <=5)
pH: 6 (ref 5.0–8.0)

## 2022-08-08 LAB — URINE CULTURE
MICRO NUMBER:: 14461167
Result:: NO GROWTH
SPECIMEN QUALITY:: ADEQUATE

## 2022-08-21 ENCOUNTER — Telehealth: Payer: Self-pay | Admitting: Family Medicine

## 2022-08-21 NOTE — Telephone Encounter (Addendum)
See 08/07/22 result message

## 2022-08-21 NOTE — Telephone Encounter (Signed)
Pt call back for result and want you to call her back on [phone].

## 2022-08-21 NOTE — Telephone Encounter (Signed)
Pt called, returning CMA's call. CMA was unavailable. Pt asked that CMA call back at her earliest convenience.

## 2022-09-10 ENCOUNTER — Emergency Department (HOSPITAL_BASED_OUTPATIENT_CLINIC_OR_DEPARTMENT_OTHER): Payer: Medicare Other | Admitting: Radiology

## 2022-09-10 ENCOUNTER — Emergency Department (HOSPITAL_BASED_OUTPATIENT_CLINIC_OR_DEPARTMENT_OTHER): Payer: Medicare Other

## 2022-09-10 ENCOUNTER — Other Ambulatory Visit: Payer: Self-pay

## 2022-09-10 ENCOUNTER — Emergency Department (HOSPITAL_BASED_OUTPATIENT_CLINIC_OR_DEPARTMENT_OTHER)
Admission: EM | Admit: 2022-09-10 | Discharge: 2022-09-11 | Disposition: A | Discharge status: Home / Self Care | Payer: Medicare Other | Attending: Emergency Medicine | Admitting: Emergency Medicine

## 2022-09-10 ENCOUNTER — Encounter (HOSPITAL_BASED_OUTPATIENT_CLINIC_OR_DEPARTMENT_OTHER): Payer: Self-pay

## 2022-09-10 DIAGNOSIS — S0990XA Unspecified injury of head, initial encounter: Secondary | ICD-10-CM | POA: Diagnosis not present

## 2022-09-10 DIAGNOSIS — I10 Essential (primary) hypertension: Secondary | ICD-10-CM | POA: Insufficient documentation

## 2022-09-10 DIAGNOSIS — S42251A Displaced fracture of greater tuberosity of right humerus, initial encounter for closed fracture: Secondary | ICD-10-CM | POA: Insufficient documentation

## 2022-09-10 DIAGNOSIS — M25561 Pain in right knee: Secondary | ICD-10-CM | POA: Diagnosis not present

## 2022-09-10 DIAGNOSIS — M25562 Pain in left knee: Secondary | ICD-10-CM | POA: Insufficient documentation

## 2022-09-10 DIAGNOSIS — S4991XA Unspecified injury of right shoulder and upper arm, initial encounter: Secondary | ICD-10-CM | POA: Diagnosis present

## 2022-09-10 DIAGNOSIS — Z79899 Other long term (current) drug therapy: Secondary | ICD-10-CM | POA: Insufficient documentation

## 2022-09-10 DIAGNOSIS — R030 Elevated blood-pressure reading, without diagnosis of hypertension: Secondary | ICD-10-CM

## 2022-09-10 DIAGNOSIS — W19XXXA Unspecified fall, initial encounter: Secondary | ICD-10-CM | POA: Diagnosis not present

## 2022-09-10 LAB — BASIC METABOLIC PANEL
Anion gap: 11 (ref 5–15)
BUN: 20 mg/dL (ref 8–23)
CO2: 25 mmol/L (ref 22–32)
Calcium: 9.9 mg/dL (ref 8.9–10.3)
Chloride: 103 mmol/L (ref 98–111)
Creatinine, Ser: 0.71 mg/dL (ref 0.44–1.00)
GFR, Estimated: >60 mL/min (ref >60)
Glucose, Bld: 103 mg/dL — ABNORMAL HIGH (ref 70–99)
Potassium: 3.7 mmol/L (ref 3.5–5.1)
Sodium: 139 mmol/L (ref 135–145)

## 2022-09-10 LAB — CBC WITH DIFFERENTIAL/PLATELET
Abs Immature Granulocytes: 0.02 10*3/uL (ref 0.00–0.07)
Basophils Absolute: 0 10*3/uL (ref 0.0–0.1)
Basophils Relative: 1 %
Eosinophils Absolute: 0 10*3/uL (ref 0.0–0.5)
Eosinophils Relative: 0 %
HCT: 41.9 % (ref 36.0–46.0)
Hemoglobin: 13.8 g/dL (ref 12.0–15.0)
Immature Granulocytes: 0 %
Lymphocytes Relative: 20 %
Lymphs Abs: 1.5 10*3/uL (ref 0.7–4.0)
MCH: 28.6 pg (ref 26.0–34.0)
MCHC: 32.9 g/dL (ref 30.0–36.0)
MCV: 86.9 fL (ref 80.0–100.0)
Monocytes Absolute: 0.5 10*3/uL (ref 0.1–1.0)
Monocytes Relative: 6 %
Neutro Abs: 5.4 10*3/uL (ref 1.7–7.7)
Neutrophils Relative %: 73 %
Platelets: 182 10*3/uL (ref 150–400)
RBC: 4.82 MIL/uL (ref 3.87–5.11)
RDW: 14.1 % (ref 11.5–15.5)
WBC: 7.5 10*3/uL (ref 4.0–10.5)
nRBC: 0 % (ref 0.0–0.2)

## 2022-09-10 MED ORDER — ACETAMINOPHEN 325 MG PO TABS
650.0000 mg | ORAL_TABLET | Freq: Once | ORAL | Status: AC
  Administered 2022-09-10: 650 mg via ORAL
  Filled 2022-09-10: qty 2

## 2022-09-10 NOTE — Discharge Instructions (Addendum)
It was a pleasure taking care of you today.  As discussed, you broke your humerus.  Keep arm in sling until evaluated by the orthopedic surgeon.  Please call tomorrow to schedule an appointment for further evaluation.  You may take over-the-counter ibuprofen or Tylenol as needed for pain.  Return to the ER for any worsening symptoms.  Your blood pressure was elevated. Please have your BP rechecked in a few days by PCP

## 2022-09-10 NOTE — ED Provider Notes (Incomplete Revision)
[facility] Provider Note   CSN: [id number] Arrival date & time: 09/10/22  1927     History  Chief Complaint  Patient presents with   [name]    [name] is a 77 y.o. female with a past medical history significant for hypertension, hyperlipidemia, previous CVA who presents to the ED after mechanical fall.  Patient states she stumbled on her flip-flop in the kitchen.  Occasionally ambulates with walker or cane.  Patient notes she landed directly on her right shoulder.  No head injury.  No loss of consciousness.  She is not currently on any blood thinners.  Patient admits to right shoulder pain and bilateral knee pain.  Also sustained a skin tear to left forearm.  Unsure when her last tetanus shot was.  History obtained from patient and past medical records. No interpreter used during encounter.       Home Medications Prior to Admission medications   Medication Sig Start Date End Date Taking? Authorizing Provider  enalapril (VASOTEC) 20 MG tablet TAKE 1 TABLET BY MOUTH EVERY DAY 07/10/22   Laurey Morale, MD  hydrochlorothiazide (HYDRODIURIL) 25 MG tablet TAKE 1 TABLET (25 MG TOTAL) BY MOUTH DAILY. 07/10/22   Laurey Morale, MD      Allergies    Amoxicillin and Meloxicam    Review of Systems   Review of Systems  Musculoskeletal:  Positive for arthralgias.  Neurological:  Negative for weakness and numbness.  All other systems reviewed and are negative.   Physical Exam Updated Vital Signs BP (!) 178/88 (BP Location: Left Arm)   Pulse 82   Temp 98.2 F (36.8 C)   Resp 18   Ht '5\' 2"'$  (1.575 m)   Wt 61.2 kg   SpO2 100%   BMI 24.69 kg/m  Physical Exam Vitals and nursing note reviewed.  Constitutional:      General: She is not in acute distress.    Appearance: She is not ill-appearing.  HENT:     Head: Normocephalic.  Eyes:     Pupils: Pupils are equal, round, and reactive to light.  Cardiovascular:     Rate and  Rhythm: Normal rate and regular rhythm.     Pulses: Normal pulses.     Heart sounds: Normal heart sounds. No murmur heard.    No friction rub. No gallop.  Pulmonary:     Effort: Pulmonary effort is normal.     Breath sounds: Normal breath sounds.  Abdominal:     General: Abdomen is flat. There is no distension.     Palpations: Abdomen is soft.     Tenderness: There is no abdominal tenderness. There is no guarding or rebound.  Musculoskeletal:        General: Normal range of motion.     Cervical back: Neck supple.     Comments: TTP throughout right shoulder with decreased range of motion due to pain.  Full range of motion of right elbow.  Radial pulse intact.  Soft compartments.  Skin:    General: Skin is warm and dry.  Neurological:     General: No focal deficit present.     Mental Status: She is alert.  Psychiatric:        Mood and Affect: Mood normal.        Behavior: Behavior normal.     ED Results / Procedures / Treatments   Labs (all labs ordered are listed, but only abnormal results are displayed) Labs  Reviewed - No data to display  EKG None  Radiology DG Knee Complete 4 Views Left  Result Date: 09/10/2022 CLINICAL DATA:  Fall in kitchen today. EXAM: LEFT KNEE - COMPLETE 4+ VIEW; RIGHT KNEE - COMPLETE 4+ VIEW COMPARISON:  None Available. FINDINGS: Right knee: No evidence of fracture, dislocation, or joint effusion. Moderate degenerative changes are present in the medial and patellofemoral compartments. Vascular calcifications are noted in the soft tissues. Left knee: No acute fracture or dislocation. Mild-to-moderate degenerative changes are noted in the medial and patellofemoral compartments. No joint effusion. Vascular calcifications are present in the soft tissues. IMPRESSION: No acute fracture or dislocation bilaterally. Electronically Signed   By: Brett Fairy M.D.   On: 09/10/2022 21:23   DG Knee Complete 4 Views Right  Result Date: 09/10/2022 CLINICAL DATA:   Fall in kitchen today. EXAM: LEFT KNEE - COMPLETE 4+ VIEW; RIGHT KNEE - COMPLETE 4+ VIEW COMPARISON:  None Available. FINDINGS: Right knee: No evidence of fracture, dislocation, or joint effusion. Moderate degenerative changes are present in the medial and patellofemoral compartments. Vascular calcifications are noted in the soft tissues. Left knee: No acute fracture or dislocation. Mild-to-moderate degenerative changes are noted in the medial and patellofemoral compartments. No joint effusion. Vascular calcifications are present in the soft tissues. IMPRESSION: No acute fracture or dislocation bilaterally. Electronically Signed   By: Brett Fairy M.D.   On: 09/10/2022 21:23   DG Shoulder Right  Result Date: 09/10/2022 CLINICAL DATA:  [place] on right shoulder EXAM: RIGHT SHOULDER - 2+ VIEW COMPARISON:  None Available. FINDINGS: Acute minimally displaced fracture of the greater tuberosity of the humerus. Question extension of the fracture line across the anatomic neck of the humerus. No dislocation. There is no evidence of arthropathy or other focal bone abnormality. Soft tissues are unremarkable. IMPRESSION: Acute minimally displaced fracture of the greater tuberosity of the humerus. Question extension of the fracture line across the anatomic neck of the humerus. Consider CT for further evaluation. Electronically Signed   By: Placido Sou M.D.   On: 09/10/2022 20:00    Procedures Procedures    Medications Ordered in ED Medications  acetaminophen (TYLENOL) tablet 650 mg (650 mg Oral Given 09/10/22 2109)    ED Course/ Medical Decision Making/ A&P Clinical Course as of 09/10/22 2201  Mon Sep 10, 2022  2045 Spoke to Dr. Laurance Flatten with orthopedics who recommends sling and follow-up in outpatient setting. [CA]    Clinical Course User Index [CA] [name], PA-C                             Medical Decision Making Amount and/or Complexity of Data Reviewed Independent Historian: spouse Labs:  ordered. Radiology: ordered and independent interpretation performed. Decision-making details documented in ED Course.  Risk OTC drugs.   77 year old female presents to the ED after a mechanical fall.  Patient fell directly on right shoulder.  No head injury or loss of consciousness.  She is not currently on blood thinners.  Tenderness throughout right shoulder.  X-ray ordered at triage which I personally reviewed and interpreted which demonstrates minimally displaced fracture of the greater tuberosity with questionable extension into the humeral head.  Discussed with Dr. Laurance Flatten with orthopedics as noted above.  No CT warranted at this time per Dr. Laurance Flatten.  Patient placed in sling.  Knee x-rays personally reviewed which are negative for any bony fractures.  Patient able to ambulate in the ED without  difficulty.  Tetanus per chart review in 2023.  Orthopedics number given to patient at discharge advised to call to schedule appointment for further evaluation. Strict ED precautions discussed with patient. Patient states understanding and agrees to plan. Patient discharged home in no acute distress and stable vitals    Discussed with Dr. Melina Copa who agrees with assessment and plan.  Has PCP       Final Clinical Impression(s) / ED Diagnoses Final diagnoses:  Fall, initial encounter  Closed displaced fracture of greater tuberosity of right humerus, initial encounter    Rx / DC Orders ED Discharge Orders     None         [name], [place] 09/10/22 2202

## 2022-09-10 NOTE — ED Provider Notes (Incomplete)
[facility] Provider Note   CSN: [id number] Arrival date & time: 09/10/22  1927     History  Chief Complaint  Patient presents with  . Fall    [name] is a 77 y.o. female with a past medical history significant for hypertension, hyperlipidemia, previous CVA who presents to the ED after mechanical fall.  Patient states she stumbled on her flip-flop in the kitchen.  Occasionally ambulates with walker or cane.  Patient notes she landed directly on her right shoulder.  No head injury.  No loss of consciousness.  She is not currently on any blood thinners.  Patient admits to right shoulder pain and bilateral knee pain.  Also sustained a skin tear to left forearm.  Unsure when her last tetanus shot was.  History obtained from patient and past medical records. No interpreter used during encounter.       Home Medications Prior to Admission medications   Medication Sig Start Date End Date Taking? Authorizing Provider  enalapril (VASOTEC) 20 MG tablet TAKE 1 TABLET BY MOUTH EVERY DAY 07/10/22   Laurey Morale, MD  hydrochlorothiazide (HYDRODIURIL) 25 MG tablet TAKE 1 TABLET (25 MG TOTAL) BY MOUTH DAILY. 07/10/22   Laurey Morale, MD      Allergies    Amoxicillin and Meloxicam    Review of Systems   Review of Systems  Musculoskeletal:  Positive for arthralgias.  Neurological:  Negative for weakness and numbness.  All other systems reviewed and are negative.   Physical Exam Updated Vital Signs BP (!) 178/88 (BP Location: Left Arm)   Pulse 82   Temp 98.2 F (36.8 C)   Resp 18   Ht '5\' 2"'$  (1.575 m)   Wt 61.2 kg   SpO2 100%   BMI 24.69 kg/m  Physical Exam Vitals and nursing note reviewed.  Constitutional:      General: She is not in acute distress.    Appearance: She is not ill-appearing.  HENT:     Head: Normocephalic.  Eyes:     Pupils: Pupils are equal, round, and reactive to light.  Cardiovascular:     Rate and  Rhythm: Normal rate and regular rhythm.     Pulses: Normal pulses.     Heart sounds: Normal heart sounds. No murmur heard.    No friction rub. No gallop.  Pulmonary:     Effort: Pulmonary effort is normal.     Breath sounds: Normal breath sounds.  Abdominal:     General: Abdomen is flat. There is no distension.     Palpations: Abdomen is soft.     Tenderness: There is no abdominal tenderness. There is no guarding or rebound.  Musculoskeletal:        General: Normal range of motion.     Cervical back: Neck supple.     Comments: TTP throughout right shoulder with decreased range of motion due to pain.  Full range of motion of right elbow.  Radial pulse intact.  Soft compartments.  Skin:    General: Skin is warm and dry.  Neurological:     General: No focal deficit present.     Mental Status: She is alert.  Psychiatric:        Mood and Affect: Mood normal.        Behavior: Behavior normal.     ED Results / Procedures / Treatments   Labs (all labs ordered are listed, but only abnormal results are displayed) Labs  Reviewed - No data to display  EKG None  Radiology DG Knee Complete 4 Views Left  Result Date: 09/10/2022 CLINICAL DATA:  Fall in kitchen today. EXAM: LEFT KNEE - COMPLETE 4+ VIEW; RIGHT KNEE - COMPLETE 4+ VIEW COMPARISON:  None Available. FINDINGS: Right knee: No evidence of fracture, dislocation, or joint effusion. Moderate degenerative changes are present in the medial and patellofemoral compartments. Vascular calcifications are noted in the soft tissues. Left knee: No acute fracture or dislocation. Mild-to-moderate degenerative changes are noted in the medial and patellofemoral compartments. No joint effusion. Vascular calcifications are present in the soft tissues. IMPRESSION: No acute fracture or dislocation bilaterally. Electronically Signed   By: Brett Fairy M.D.   On: 09/10/2022 21:23   DG Knee Complete 4 Views Right  Result Date: 09/10/2022 CLINICAL DATA:   Fall in kitchen today. EXAM: LEFT KNEE - COMPLETE 4+ VIEW; RIGHT KNEE - COMPLETE 4+ VIEW COMPARISON:  None Available. FINDINGS: Right knee: No evidence of fracture, dislocation, or joint effusion. Moderate degenerative changes are present in the medial and patellofemoral compartments. Vascular calcifications are noted in the soft tissues. Left knee: No acute fracture or dislocation. Mild-to-moderate degenerative changes are noted in the medial and patellofemoral compartments. No joint effusion. Vascular calcifications are present in the soft tissues. IMPRESSION: No acute fracture or dislocation bilaterally. Electronically Signed   By: Brett Fairy M.D.   On: 09/10/2022 21:23   DG Shoulder Right  Result Date: 09/10/2022 CLINICAL DATA:  [place] on right shoulder EXAM: RIGHT SHOULDER - 2+ VIEW COMPARISON:  None Available. FINDINGS: Acute minimally displaced fracture of the greater tuberosity of the humerus. Question extension of the fracture line across the anatomic neck of the humerus. No dislocation. There is no evidence of arthropathy or other focal bone abnormality. Soft tissues are unremarkable. IMPRESSION: Acute minimally displaced fracture of the greater tuberosity of the humerus. Question extension of the fracture line across the anatomic neck of the humerus. Consider CT for further evaluation. Electronically Signed   By: Placido Sou M.D.   On: 09/10/2022 20:00    Procedures Procedures    Medications Ordered in ED Medications  acetaminophen (TYLENOL) tablet 650 mg (650 mg Oral Given 09/10/22 2109)    ED Course/ Medical Decision Making/ A&P Clinical Course as of 09/10/22 2201  Mon Sep 10, 2022  2045 Spoke to Dr. Laurance Flatten with orthopedics who recommends sling and follow-up in outpatient setting. [CA]    Clinical Course User Index [CA] [name], PA-C                             Medical Decision Making Amount and/or Complexity of Data Reviewed Independent Historian: spouse Labs:  ordered. Radiology: ordered and independent interpretation performed. Decision-making details documented in ED Course.  Risk OTC drugs.   77 year old female presents to the ED after a mechanical fall.  Patient fell directly on right shoulder.  No head injury or loss of consciousness.  She is not currently on blood thinners.  Tenderness throughout right shoulder.  X-ray ordered at triage which I personally reviewed and interpreted which demonstrates minimally displaced fracture of the greater tuberosity with questionable extension into the humeral head.  Discussed with Dr. Laurance Flatten with orthopedics as noted above.  No CT warranted at this time per Dr. Laurance Flatten.  Patient placed in sling.  Knee x-rays personally reviewed which are negative for any bony fractures.  Patient able to ambulate in the ED without  difficulty.  Tetanus per chart review in 2023.    When patient was re-evaluated prior to discharge, she seemed slightly confused with repetitive questioning. Unsure patient's baseline. Attempted to call daughter with no answer. Routine labs, UA, and CT head ordered  CBC unremarkable.  No leukocytosis.  Normal hemoglobin.  BMP reassuring.  Normal renal function.  No major electrolyte derangements.  CT head personally reviewed and interpreted negative for any acute abnormalities.   Discussed with Dr. Melina Copa who agrees with assessment and plan.  Has PCP  Final Clinical Impression(s) / ED Diagnoses Final diagnoses:  Fall, initial encounter  Closed displaced fracture of greater tuberosity of right humerus, initial encounter    Rx / DC Orders ED Discharge Orders     None         [name], [place] 09/10/22 2202

## 2022-09-10 NOTE — ED Provider Notes (Cosign Needed)
[facility] Provider Note   CSN: [id number] Arrival date & time: 09/10/22  1927     History  Chief Complaint  Patient presents with   [name]    [name] is a 77 y.o. female with a past medical history significant for hypertension, hyperlipidemia, previous CVA who presents to the ED after mechanical fall.  Patient states she stumbled on her flip-flop in the kitchen.  Occasionally ambulates with walker or cane.  Patient notes she landed directly on her right shoulder.  No head injury.  No loss of consciousness.  She is not currently on any blood thinners.  Patient admits to right shoulder pain and bilateral knee pain.  Also sustained a skin tear to left forearm.  Unsure when her last tetanus shot was.  History obtained from patient and past medical records. No interpreter used during encounter.       Home Medications Prior to Admission medications   Medication Sig Start Date End Date Taking? Authorizing Provider  enalapril (VASOTEC) 20 MG tablet TAKE 1 TABLET BY MOUTH EVERY DAY 07/10/22   Laurey Morale, MD  hydrochlorothiazide (HYDRODIURIL) 25 MG tablet TAKE 1 TABLET (25 MG TOTAL) BY MOUTH DAILY. 07/10/22   Laurey Morale, MD      Allergies    Amoxicillin and Meloxicam    Review of Systems   Review of Systems  Musculoskeletal:  Positive for arthralgias.  Neurological:  Negative for weakness and numbness.  All other systems reviewed and are negative.   Physical Exam Updated Vital Signs BP (!) 178/88 (BP Location: Left Arm)   Pulse 82   Temp 98.2 F (36.8 C)   Resp 18   Ht '5\' 2"'$  (1.575 m)   Wt 61.2 kg   SpO2 100%   BMI 24.69 kg/m  Physical Exam Vitals and nursing note reviewed.  Constitutional:      General: She is not in acute distress.    Appearance: She is not ill-appearing.  HENT:     Head: Normocephalic.  Eyes:     Pupils: Pupils are equal, round, and reactive to light.  Cardiovascular:     Rate and  Rhythm: Normal rate and regular rhythm.     Pulses: Normal pulses.     Heart sounds: Normal heart sounds. No murmur heard.    No friction rub. No gallop.  Pulmonary:     Effort: Pulmonary effort is normal.     Breath sounds: Normal breath sounds.  Abdominal:     General: Abdomen is flat. There is no distension.     Palpations: Abdomen is soft.     Tenderness: There is no abdominal tenderness. There is no guarding or rebound.  Musculoskeletal:        General: Normal range of motion.     Cervical back: Neck supple.     Comments: TTP throughout right shoulder with decreased range of motion due to pain.  Full range of motion of right elbow.  Radial pulse intact.  Soft compartments.  Skin:    General: Skin is warm and dry.  Neurological:     General: No focal deficit present.     Mental Status: She is alert.  Psychiatric:        Mood and Affect: Mood normal.        Behavior: Behavior normal.     ED Results / Procedures / Treatments   Labs (all labs ordered are listed, but only abnormal results are displayed) Labs  Reviewed - No data to display  EKG None  Radiology DG Knee Complete 4 Views Left  Result Date: 09/10/2022 CLINICAL DATA:  Fall in kitchen today. EXAM: LEFT KNEE - COMPLETE 4+ VIEW; RIGHT KNEE - COMPLETE 4+ VIEW COMPARISON:  None Available. FINDINGS: Right knee: No evidence of fracture, dislocation, or joint effusion. Moderate degenerative changes are present in the medial and patellofemoral compartments. Vascular calcifications are noted in the soft tissues. Left knee: No acute fracture or dislocation. Mild-to-moderate degenerative changes are noted in the medial and patellofemoral compartments. No joint effusion. Vascular calcifications are present in the soft tissues. IMPRESSION: No acute fracture or dislocation bilaterally. Electronically Signed   By: Brett Fairy M.D.   On: 09/10/2022 21:23   DG Knee Complete 4 Views Right  Result Date: 09/10/2022 CLINICAL DATA:   Fall in kitchen today. EXAM: LEFT KNEE - COMPLETE 4+ VIEW; RIGHT KNEE - COMPLETE 4+ VIEW COMPARISON:  None Available. FINDINGS: Right knee: No evidence of fracture, dislocation, or joint effusion. Moderate degenerative changes are present in the medial and patellofemoral compartments. Vascular calcifications are noted in the soft tissues. Left knee: No acute fracture or dislocation. Mild-to-moderate degenerative changes are noted in the medial and patellofemoral compartments. No joint effusion. Vascular calcifications are present in the soft tissues. IMPRESSION: No acute fracture or dislocation bilaterally. Electronically Signed   By: Brett Fairy M.D.   On: 09/10/2022 21:23   DG Shoulder Right  Result Date: 09/10/2022 CLINICAL DATA:  [place] on right shoulder EXAM: RIGHT SHOULDER - 2+ VIEW COMPARISON:  None Available. FINDINGS: Acute minimally displaced fracture of the greater tuberosity of the humerus. Question extension of the fracture line across the anatomic neck of the humerus. No dislocation. There is no evidence of arthropathy or other focal bone abnormality. Soft tissues are unremarkable. IMPRESSION: Acute minimally displaced fracture of the greater tuberosity of the humerus. Question extension of the fracture line across the anatomic neck of the humerus. Consider CT for further evaluation. Electronically Signed   By: Placido Sou M.D.   On: 09/10/2022 20:00    Procedures Procedures    Medications Ordered in ED Medications  acetaminophen (TYLENOL) tablet 650 mg (650 mg Oral Given 09/10/22 2109)    ED Course/ Medical Decision Making/ A&P Clinical Course as of 09/10/22 2201  Mon Sep 10, 2022  2045 Spoke to Dr. Laurance Flatten with orthopedics who recommends sling and follow-up in outpatient setting. [CA]    Clinical Course User Index [CA] Elizabeth Bouchard, PA-C                             Medical Decision Making Amount and/or Complexity of Data Reviewed Independent Historian: spouse Labs:  ordered. Decision-making details documented in ED Course. Radiology: ordered and independent interpretation performed. Decision-making details documented in ED Course.  Risk OTC drugs.   77 year old female presents to the ED after a mechanical fall.  Patient fell directly on right shoulder.  No head injury or loss of consciousness.  She is not currently on blood thinners.  Tenderness throughout right shoulder.  X-ray ordered at triage which I personally reviewed and interpreted which demonstrates minimally displaced fracture of the greater tuberosity with questionable extension into the humeral head.  Discussed with Dr. Laurance Flatten with orthopedics as noted above.  No CT warranted at this time per Dr. Laurance Flatten.  Patient placed in sling.  Knee x-rays personally reviewed which are negative for any bony fractures.  Patient able  to ambulate in the ED without difficulty.  Tetanus per chart review in 2023.    When patient was re-evaluated prior to discharge, she seemed slightly confused with repetitive questioning. Unsure patient's baseline. Attempted to call daughter with no answer. Routine labs, UA, and CT head ordered  CBC unremarkable.  No leukocytosis.  Normal hemoglobin.  BMP reassuring.  Normal renal function.  No major electrolyte derangements.  CT head personally reviewed and interpreted negative for any acute abnormalities.  Patient handed off to Dr. Karle Starch at shift change pending UA results.    Discussed with Dr. Melina Copa who agrees with assessment and plan.    Has PCP  Final Clinical Impression(s) / ED Diagnoses Final diagnoses:  Fall, initial encounter  Closed displaced fracture of greater tuberosity of right humerus, initial encounter    Rx / DC Orders ED Discharge Orders     None         Elizabeth Bouchard, PA-C 09/10/22 2202    Elizabeth Bouchard, PA-C 09/11/22 0019    Hayden Rasmussen, MD 09/11/22 [phone]

## 2022-09-10 NOTE — ED Triage Notes (Signed)
Patient here POV from Home.  Endorses Fall today after she tripped in the Kitchen falling onto her Right Shoulder. No Head injury. This occurred at 1100 today.  Pain is to Right Shoulder. Swelling noted to Same.   NAD Noted during Triage. A&Ox4. GCS 15. BIB Wheelchair.

## 2022-09-11 LAB — URINALYSIS, ROUTINE W REFLEX MICROSCOPIC
Bilirubin Urine: NEGATIVE
Glucose, UA: NEGATIVE mg/dL
Ketones, ur: NEGATIVE mg/dL
Nitrite: NEGATIVE
Protein, ur: NEGATIVE mg/dL
Specific Gravity, Urine: 1.008 (ref 1.005–1.030)
pH: 5.5 (ref 5.0–8.0)

## 2022-09-11 NOTE — ED Notes (Signed)
DC papers reviewed. No questions or concerns. No signs of distress. Pt assisted to wheelchair and out to lobby. Appropriate measures for safety taken. Refuses assistance into car. Husband driving home.

## 2022-09-12 ENCOUNTER — Ambulatory Visit (INDEPENDENT_AMBULATORY_CARE_PROVIDER_SITE_OTHER): Payer: Medicare Other

## 2022-09-12 ENCOUNTER — Ambulatory Visit: Payer: Medicare Other | Admitting: Orthopedic Surgery

## 2022-09-12 DIAGNOSIS — M25511 Pain in right shoulder: Secondary | ICD-10-CM

## 2022-09-12 NOTE — Progress Notes (Addendum)
Orthopedic Surgery Progress Note   Assessment: Patient is a 77 y.o. female with right greater tuberosity fracture Date of injury: 09/10/2022   Plan: -Operative plans: none -Non-weight bearing right upper extremity in sling -OTC medications for pain control -Sling that our goal will be to get her pain to decrease and get her function back to her baseline.  I told her that she would likely return to her previous baseline with some pain and difficulty with overhead activities due to her prior rotator cuff issue -Return to office in 2 weeks, repeat XRs at next visit: Grashey/axillary/scapular Y right shoulder  ___________________________________________________________________________  Subjective: Patient is a 77 year old female with past medical history of hypertension, hyperlipidemia, history of stroke who had a ground-level fall and sustained a right greater tuberosity fracture on 09/10/2022.  She was seen in the emergency department on the date of injury. She is coming in for follow up on this injury.  Pain has gotten significantly better even with the last 2 days.  She is now just taking over-the-counter medications for pain relief.  She has no pain outside of the shoulder.  She has been in a sling.  Denies paresthesias and numbness.  Of note, she mentioned she had previous rotator cuff tear in her right shoulder and had discussed surgery for it with an outside surgeon in the past. Had trouble with overhead activities before this fall.   Physical Exam:  General: no acute distress, appears stated age Neurologic: alert, answering questions appropriately, following commands Respiratory: unlabored breathing on room air, symmetric chest rise Psychiatric: appropriate affect, normal cadence to speech  MSK:   -Right upper extremity  No tenderness to palpation over extremity, except over the shoulder Fires deltoid, biceps, triceps, wrist extensors, wrist flexors, finger extensors, finger  flexors  AIN/PIN/IO intact  Palpable radial pulse  Sensation intact to light touch in median/ulnar/radial/axillary nerve distributions  Hand warm and well perfused  XR of the right shoulder from 09/10/2022 and 09/12/2022 show a minimally displaced and comminuted greater tuberosity fracture. No other fractures seen. No dislocation at the glenohumeral joint.    Patient name: [name] Patient MRN: [id number] Date: 09/12/22

## 2022-09-17 ENCOUNTER — Telehealth: Payer: Self-pay

## 2022-09-17 NOTE — Telephone Encounter (Signed)
        Patient  visited [facility] on 2/27     Telephone encounter attempt :  1st  A HIPAA compliant voice message was left requesting a return call.  Instructed patient to call back .    [facility] [phone] [address], [place], [place] [address] Phone: [phone] Email: [email or web address]

## 2022-09-18 ENCOUNTER — Telehealth: Payer: Self-pay

## 2022-09-18 NOTE — Telephone Encounter (Signed)
        Patient  visited [facility] on 2/27    Telephone encounter attempt :  2nd  A HIPAA compliant voice message was left requesting a return call.  Instructed patient to call back .    [facility] [phone] [address], [place], [place] [address] Phone: [phone] Email: [email or web address]

## 2022-09-19 ENCOUNTER — Telehealth: Payer: Self-pay

## 2022-09-19 NOTE — Telephone Encounter (Signed)
     Patient  visit on 2/27  at [facility]  Have you been able to follow up with your primary care physician? Yes   The patient was or was not able to obtain any needed medicine or equipment. Yes   Are there diet recommendations that you are having difficulty following? Na   Patient expresses understanding of discharge instructions and education provided has no other needs at this time.  Yes      [facility] [phone] [address], [place], [place] [address] Phone: [phone] Email: [email or web address]

## 2022-09-27 ENCOUNTER — Other Ambulatory Visit (INDEPENDENT_AMBULATORY_CARE_PROVIDER_SITE_OTHER): Payer: Medicare Other

## 2022-09-27 ENCOUNTER — Ambulatory Visit: Payer: Medicare Other | Admitting: Orthopedic Surgery

## 2022-09-27 DIAGNOSIS — M25511 Pain in right shoulder: Secondary | ICD-10-CM

## 2022-09-27 NOTE — Progress Notes (Signed)
Orthopedic Surgery Progress Note     Assessment: Patient is a 77 y.o. female with right greater tuberosity fracture Date of injury: 09/10/2022 (~2 weeks from date of injury)      Plan: -Operative plans: none -Non-weight bearing right upper extremity, can be out of the sling when in the house but should use it when out of the house -Start pendulum swings which were demonstrated to her today in the office -OTC medications for pain control -Reiterated that our goal will be to get her pain to decrease and get her function back to her baseline.  I told her that she would likely return to her previous baseline with some pain and difficulty with overhead activities due to her prior rotator cuff issue -Return to office in 4 weeks, repeat XRs at next visit: Grashey/axillary/scapular Y right shoulder   ___________________________________________________________________________   Subjective: Patient is a 77 year old female with past medical history of hypertension, hyperlipidemia, history of stroke who had a ground-level fall and sustained a right greater tuberosity fracture on 09/10/2022.  Her shoulder pain has improved since the last time I saw her.  She does not have much pain if her shoulder is at rest at her side.  She has started to wean herself out of the sling ahead of my schedule.  She has no pain outside the shoulder.  Denies paresthesias and numbness.   Physical Exam:   General: no acute distress, appears stated age Neurologic: alert, answering questions appropriately, following commands Respiratory: unlabored breathing on room air, symmetric chest rise Psychiatric: appropriate affect, normal cadence to speech   MSK:    -Right upper extremity             No tenderness to palpation over extremity, no gross deformity Fires deltoid, biceps, triceps, wrist extensors, wrist flexors, finger extensors, finger flexors             AIN/PIN/IO intact             Palpable radial pulse              Sensation intact to light touch in median/ulnar/radial/axillary nerve distributions             Hand warm and well perfused   XR of the right shoulder from 09/27/2022 show a displaced and comminuted greater tuberosity fracture. No other fractures seen. No dislocation at the glenohumeral joint.      Patient name: [name] Patient MRN: [id number] Date: 09/27/22

## 2022-10-02 ENCOUNTER — Ambulatory Visit: Payer: Medicare Other | Admitting: Podiatry

## 2022-10-02 ENCOUNTER — Encounter: Payer: Self-pay | Admitting: Podiatry

## 2022-10-02 DIAGNOSIS — M79674 Pain in right toe(s): Secondary | ICD-10-CM | POA: Diagnosis not present

## 2022-10-02 DIAGNOSIS — N182 Chronic kidney disease, stage 2 (mild): Secondary | ICD-10-CM | POA: Diagnosis not present

## 2022-10-02 DIAGNOSIS — B351 Tinea unguium: Secondary | ICD-10-CM | POA: Diagnosis not present

## 2022-10-02 DIAGNOSIS — M79675 Pain in left toe(s): Secondary | ICD-10-CM | POA: Diagnosis not present

## 2022-10-02 NOTE — Progress Notes (Signed)
This patient returns to my office for at risk foot care.  This patient requires this care by a professional since this patient will be at risk due to having CKD. This patient is unable to cut nails herself since the patient cannot reach hernails.These nails are painful walking and wearing shoes.  This patient presents for at risk foot care today.  General Appearance  Alert, conversant and in no acute stress.  Vascular  Dorsalis pedis and posterior tibial  pulses are palpable  bilaterally.  Capillary return is within normal limits  bilaterally. Temperature is within normal limits  bilaterally.  Neurologic  Senn-Weinstein monofilament wire test within normal limits  bilaterally. Muscle power within normal limits bilaterally.  Nails Thick disfigured discolored nails with subungual debris  from hallux to fifth toes bilaterally. No evidence of bacterial infection or drainage bilaterally.  Orthopedic  No limitations of motion  feet .  No crepitus or effusions noted.  No bony pathology or digital deformities noted.  HAV  B/L  Skin  normotropic skin with no porokeratosis noted bilaterally.  No signs of infections or ulcers noted.     Onychomycosis  Pain in right toes  Pain in left toes  Consent was obtained for treatment procedures.   Mechanical debridement of nails 1-5  bilaterally especially her third toe right foot which is thickened  performed with a nail nipper.  Filed with dremel without incident.    Return office visit   prn                  Told patient to return for periodic foot care and evaluation due to potential at risk complications.   Gardiner Barefoot DPM

## 2022-10-03 ENCOUNTER — Ambulatory Visit: Payer: Medicare Other | Admitting: Orthopedic Surgery

## 2022-10-09 ENCOUNTER — Other Ambulatory Visit: Payer: Self-pay | Admitting: Family Medicine

## 2022-10-09 DIAGNOSIS — I10 Essential (primary) hypertension: Secondary | ICD-10-CM

## 2022-11-01 ENCOUNTER — Other Ambulatory Visit (INDEPENDENT_AMBULATORY_CARE_PROVIDER_SITE_OTHER): Payer: Medicare Other

## 2022-11-01 ENCOUNTER — Ambulatory Visit: Payer: Medicare Other | Admitting: Orthopedic Surgery

## 2022-11-01 DIAGNOSIS — M25511 Pain in right shoulder: Secondary | ICD-10-CM

## 2022-11-01 DIAGNOSIS — S42251D Displaced fracture of greater tuberosity of right humerus, subsequent encounter for fracture with routine healing: Secondary | ICD-10-CM | POA: Diagnosis not present

## 2022-11-01 NOTE — Progress Notes (Signed)
Orthopedic Surgery Progress Note     Assessment: Patient is a 77 y.o. female with right greater tuberosity fracture Date of injury: 09/10/2022 (~7 weeks from date of injury)      Plan: -Operative plans: none -Weight bearing status: as tolerated -Will start PT at this time -OTC medications for pain control -Return to office in 6 weeks, repeat XRs at next visit: Grashey/axillary/scapular Y right shoulder   ___________________________________________________________________________   Subjective: Patient is a 77 year old female with past medical history of hypertension, hyperlipidemia, history of stroke who had a ground-level fall and sustained a right greater tuberosity fracture on 09/10/2022.  Patient states her shoulder is feeling better since the last time I saw her.  She states that she still has pain and has difficulty with shoulder abduction.  No pain with forward flexion or rotation through the shoulder.  Pain is tolerable at this point.  Denies paresthesia numbness.   Physical Exam:   General: no acute distress, appears stated age Neurologic: alert, answering questions appropriately, following commands Respiratory: unlabored breathing on room air, symmetric chest rise Psychiatric: appropriate affect, normal cadence to speech   MSK:    -Right upper extremity             No tenderness to palpation over extremity, no gross deformity Fires deltoid, biceps, triceps, wrist extensors, wrist flexors, finger extensors, finger flexors             AIN/PIN/IO intact             Sensation intact to light touch in median/ulnar/radial/axillary nerve distributions             Hand warm and well perfused, palpable radial pulse   XR of the right shoulder from 11/01/2022/2024 show a displaced and comminuted greater tuberosity fracture. No interval displacement from films on 09/27/2022. No other fractures seen. No dislocation at the glenohumeral joint.  Decreased joint space and osteophyte seen at the  inferior aspect of the proximal humerus.     Patient name: [name] Patient MRN: [id number] Date: 11/01/22

## 2022-11-13 ENCOUNTER — Encounter: Payer: Self-pay | Admitting: Rehabilitative and Restorative Service Providers"

## 2022-11-13 ENCOUNTER — Ambulatory Visit: Payer: Medicare Other | Attending: Orthopedic Surgery | Admitting: Rehabilitative and Restorative Service Providers"

## 2022-11-13 ENCOUNTER — Other Ambulatory Visit: Payer: Self-pay

## 2022-11-13 DIAGNOSIS — M6281 Muscle weakness (generalized): Secondary | ICD-10-CM | POA: Insufficient documentation

## 2022-11-13 DIAGNOSIS — M25511 Pain in right shoulder: Secondary | ICD-10-CM

## 2022-11-13 DIAGNOSIS — R2689 Other abnormalities of gait and mobility: Secondary | ICD-10-CM | POA: Insufficient documentation

## 2022-11-13 DIAGNOSIS — S42251D Displaced fracture of greater tuberosity of right humerus, subsequent encounter for fracture with routine healing: Secondary | ICD-10-CM | POA: Insufficient documentation

## 2022-11-13 NOTE — Therapy (Addendum)
OUTPATIENT PHYSICAL THERAPY UPPER EXTREMITY EVALUATION   Patient Name: [name] MRN: [id number] DOB:10/21/1945, 77 y.o., female Today's Date: 11/13/2022  END OF SESSION:  PT End of Session - 11/13/22 1015     Visit Number 1    Date for PT Re-Evaluation 01/04/23    Authorization Type [employer] Medicare    Progress Note Due on Visit 10    PT Start Time 1010    PT Stop Time 1050    PT Time Calculation (min) 40 min    Activity Tolerance Patient tolerated treatment well    Behavior During Therapy WFL for tasks assessed/performed             Past Medical History:  Diagnosis Date   Agatston coronary artery calcium score greater than 400    827 coronary calcium score which is 92nd percentile for age, race and sex matched controls   Anemia    Anxiety    Aortic atherosclerosis (HCC)    Arthritis    Backache, unspecified    Blood transfusion without reported diagnosis    Cataract    bil eyes   Depression    Hemiplegia, unspecified, affecting unspecified side    Lacunar infarction (HCC)    Other and unspecified hyperlipidemia    Polymyalgia (HCC)    Retinal defect, unspecified    tear of retina   Rotator cuff injury    right shoulder   Unspecified essential hypertension    Venous tributary (branch) occlusion of retina    Past Surgical History:  Procedure Laterality Date   ABDOMINAL HYSTERECTOMY     CHOLECYSTECTOMY     COLONOSCOPY  01/11/2010   per Dr. Carman Ching, internal hemorrhoids only, repeat in 10 yrs    SPHINCTEROTOMY     UPPER GASTROINTESTINAL ENDOSCOPY     Patient Active Problem List   Diagnosis Date Noted   Agatston coronary artery calcium score greater than 400 12/30/2021   Dysphagia 10/04/2021   Aortic atherosclerosis (HCC) 10/04/2021   Lesion of tonsil 03/15/2020   AKI (acute kidney injury) (HCC) 03/06/2020   CKD stage G2/A2, GFR 60-89 and albumin creatinine ratio 30-299 mg/g 03/06/2020   Hypomagnesemia 03/06/2020   Polymyalgia rheumatica (HCC)  03/06/2020   Hypercalcemia 03/04/2020   Confusion 03/02/2020   Memory loss 03/02/2020   B12 deficiency 01/09/2019   Vertigo 10/31/2018   Hypokalemia 10/31/2018   Chest pain 10/30/2018   Carpal tunnel syndrome, bilateral 08/11/2018   Hyperlipidemia 11/22/2009   Cerebral artery occlusion with cerebral infarction (HCC) 11/22/2009   BACK PAIN, CHRONIC 11/22/2009   UNSPECIFIED RETINAL DEFECT 11/08/2009   ROTATOR CUFF INJURY, RIGHT SHOULDER 11/08/2009   BRANCH RETINAL VEIN OCCLUSION 08/12/2007   Essential hypertension 08/12/2007    PCP: Nelwyn Salisbury, MD  REFERRING PROVIDER: London Sheer, MD  REFERRING DIAG: [phone] (ICD-10-CM) - Closed displaced fracture of greater tuberosity of right humerus with routine healing, subsequent encounter  THERAPY DIAG:  Right shoulder pain, unspecified chronicity  Muscle weakness (generalized)  Other abnormalities of gait and mobility  Rationale for Evaluation and Treatment: Rehabilitation  ONSET DATE: 09/10/2022  SUBJECTIVE:  SUBJECTIVE STATEMENT: Pt reports that she was wearing flip flops in her kitchen and the shoe caught on the floor and she could not regain her balance in time and fell to the floor.  Patient landed on right shoulder. Patient reports history of imbalance with history of multiple falls. Hand dominance: Right  PERTINENT HISTORY: Polymyalgia Rheumatica (PMR), hypertension, hyperlipidemia, previous CVA   PAIN:  Are you having pain? Yes: NPRS scale: 6-7/10 Pain location: right shoulder Pain description: cutting/pulling Aggravating factors: certain movements Relieving factors: rest/not moving  PRECAUTIONS: Fall Per Dr Willia Craze on 11/01/2022:  Okay to start active and passive range of motion. In 3 weeks, can start strengthening  exercises.  WEIGHT BEARING RESTRICTIONS: No  FALLS:  Has patient fallen in last 6 months? Yes. Number of falls 2  LIVING ENVIRONMENT: Lives with: lives with their spouse and lives with their daughter Lives in: House/apartment Stairs: Yes: External: 1 steps; none Has following equipment at home: Single point cane, Walker - 2 wheeled, and shower chair  OCCUPATION: Retired  PLOF: Independent and Leisure: drive with daughter in parks on Sunday, reading/Bible study  PATIENT GOALS: To improve my movement.  NEXT MD VISIT: Dec 13, 2022 with Dr Christell Constant  OBJECTIVE:   DIAGNOSTIC FINDINGS:  Right shoulder radiograph on 09/10/2022: IMPRESSION:  Acute minimally displaced fracture of the greater tuberosity of the humerus. Question extension of the fracture line across the anatomic neck of the humerus.   PATIENT SURVEYS :  Eval:  FOTO 50% (projected 61% by visit 11)  COGNITION: Overall cognitive status:  patient reports that sometimes she has problems with memory and can get confusion at times      POSTURE: Forward head, rounded shoulders  UPPER EXTREMITY ROM:   11/13/2022 (A/ROM): Right shoulder flexion:  90 degrees Right shoulder abduction:  82 degrees Left shoulder flexion:  115 degrees Left shoulder abduction: 105 degrees  UPPER/LOWER EXTREMITY MMT:  11/13/2022: Right shoulder strength is 3-/5 Left shoulder strength is 3+/5 within available range BLE strength is grossly 4/5 throughout   FUNCTIONAL ASSESSMENT:   11/13/2022: 5 times sit to/from stand:  23.86 sec with UE use and decreased eccentric muscle control with stand to sit Timed Up and Go (TUG):  22.16 sec    TODAY'S TREATMENT:                                                                                                                                          DATE: 11/13/2022 Reviewed HEP Seated shoulder flexion AA/ROM with cane x10 Scapular retraction x10  PATIENT EDUCATION: Education details: Issued  HEP Person educated: Patient Education method: Programmer, multimedia, Facilities manager, and Handouts Education comprehension: returned demonstration and needs further education  HOME EXERCISE PROGRAM: Access Code: [facility] URL: [email or web address] Date: 11/13/2022 Prepared by: Reather Laurence  Exercises - Supine Shoulder Flexion Extension AAROM with Dowel  - 1-2 x daily - 7 x weekly -  2 sets - 10 reps - Seated Scapular Retraction  - 1-2 x daily - 7 x weekly - 2 sets - 10 reps - Seated Shoulder Flexion Towel Slide at Table Top  - 1-2 x daily - 7 x weekly - 2 sets - 10 reps - Seated Shoulder Abduction Towel Slide at Table Top  - 1-2 x daily - 7 x weekly - 2 sets - 10 reps - Seated March  - 1-2 x daily - 7 x weekly - 2 sets - 10 reps - Seated Long Arc Quad  - 1-2 x daily - 7 x weekly - 2 sets - 10 reps  ASSESSMENT:  CLINICAL IMPRESSION: Patient is a 77 y.o. female who was seen today for physical therapy evaluation and treatment for s/p right greater tuberosity fracture on 09/10/2022 secondary to a fall. Patient reports a history of Polymyalgia Rheumatica that leads to hear having increased weakness and difficulty walking.  Patient reports a history of often losing her balance, but states that the fall on 09/10/2022 was the most severe, as she usually catches herself on a wall or furniture.  Patient has a cane and wheeled walker at home, but presents to PT without using assistive device.  Patient with noted unsteady gait with wide base of support and shuffling gait pattern.  Patient presents with muscle weakness, increased right shoulder pain, difficulty walking, and decreased right shoulder A/ROM.  Patient would benefit from skilled PT to address her functional impairments to allow her to more safely complete activities in her home without increased pain or loss of balance.   OBJECTIVE IMPAIRMENTS: decreased balance, difficulty walking, decreased ROM, decreased strength, postural dysfunction,  and pain.   ACTIVITY LIMITATIONS: carrying, lifting, reach over head, and locomotion level  PARTICIPATION LIMITATIONS: cleaning and community activity  PERSONAL FACTORS: Age, Past/current experiences, Time since onset of injury/illness/exacerbation, and 3+ comorbidities: Polymyalgia Rheumatica, OA, Hx of CVA  are also affecting patient's functional outcome.   REHAB POTENTIAL: Good  CLINICAL DECISION MAKING: Evolving/moderate complexity  EVALUATION COMPLEXITY: Moderate  GOALS: Goals reviewed with patient? Yes  SHORT TERM GOALS: Target date: 11/30/2022  Patient will be independent with initial HEP. Baseline: Goal status: INITIAL  2.  Patient will increase right shoulder flexion and abduction A/ROM to at least 100 degrees. Baseline:  Goal status: INITIAL   LONG TERM GOALS: Target date: 01/04/2023  Patient will be independent with advanced HEP. Baseline:  Goal status: INITIAL  2.  Patient will increase shoulder FOTO to at least 61% to demonstrate improvements in functional mobility. Baseline:  Goal status: INITIAL  3.  Patient will increase shoulder A/ROM to [facility] without increased pain to allow her to place objects in overhead cabinets. Baseline:  Goal status: INITIAL  4.  Patient will increase right shoulder strength to at least 4 to 4+/5 to allow her to perform functional tasks in home. Baseline: 3-/5 Goal status: INITIAL  5.  Patient will improve 5 times sit to/from stand to 17 seconds or less to demonstrate improved functional strength. Baseline: 23.86 sec Goal status: INITIAL  6.  Patient will improve TUG to 14 seconds or less to place patient at a lower risk of falling. Baseline: 22.16 sec Goal status: INITIAL  PLAN: PT FREQUENCY: 2x/week  PT DURATION: 8 weeks  PLANNED INTERVENTIONS: Therapeutic exercises, Therapeutic activity, Neuromuscular re-education, Balance training, Gait training, Patient/Family education, Self Care, Joint mobilization, Joint  manipulation, Stair training, Aquatic Therapy, Dry Needling, Electrical stimulation, Spinal manipulation, Spinal mobilization, Cryotherapy, Moist heat, Taping, Ultrasound,  Ionotophoresis 4mg /ml Dexamethasone, Manual therapy, and Re-evaluation  PLAN FOR NEXT SESSION: assess and progress HEP as indicated, pulleys, shoulder ROM, strengthening   Reather Laurence, PT 11/13/2022, 11:00 AM   [facility] [address], [address] [place], [place] [address] Phone # [phone] Fax [phone]

## 2022-11-20 ENCOUNTER — Ambulatory Visit: Payer: Medicare Other | Attending: Orthopedic Surgery

## 2022-11-20 DIAGNOSIS — M25511 Pain in right shoulder: Secondary | ICD-10-CM | POA: Insufficient documentation

## 2022-11-20 DIAGNOSIS — M6281 Muscle weakness (generalized): Secondary | ICD-10-CM | POA: Insufficient documentation

## 2022-11-20 DIAGNOSIS — R2689 Other abnormalities of gait and mobility: Secondary | ICD-10-CM | POA: Diagnosis present

## 2022-11-20 NOTE — Therapy (Signed)
OUTPATIENT PHYSICAL THERAPY TREATMENT   Patient Name: [name] MRN: [id number] DOB:1946-02-14, 77 y.o., female Today's Date: 11/20/2022  END OF SESSION:  PT End of Session - 11/20/22 0938     Visit Number 2    Date for PT Re-Evaluation 01/04/23    Authorization Type [employer] Medicare    Progress Note Due on Visit 10    PT Start Time 0848    PT Stop Time 0930    PT Time Calculation (min) 42 min    Activity Tolerance Patient tolerated treatment well    Behavior During Therapy WFL for tasks assessed/performed              Past Medical History:  Diagnosis Date   Agatston coronary artery calcium score greater than 400    827 coronary calcium score which is 92nd percentile for age, race and sex matched controls   Anemia    Anxiety    Aortic atherosclerosis (HCC)    Arthritis    Backache, unspecified    Blood transfusion without reported diagnosis    Cataract    bil eyes   Depression    Hemiplegia, unspecified, affecting unspecified side    Lacunar infarction (HCC)    Other and unspecified hyperlipidemia    Polymyalgia (HCC)    Retinal defect, unspecified    tear of retina   Rotator cuff injury    right shoulder   Unspecified essential hypertension    Venous tributary (branch) occlusion of retina    Past Surgical History:  Procedure Laterality Date   ABDOMINAL HYSTERECTOMY     CHOLECYSTECTOMY     COLONOSCOPY  01/11/2010   per Dr. Carman Ching, internal hemorrhoids only, repeat in 10 yrs    SPHINCTEROTOMY     UPPER GASTROINTESTINAL ENDOSCOPY     Patient Active Problem List   Diagnosis Date Noted   Agatston coronary artery calcium score greater than 400 12/30/2021   Dysphagia 10/04/2021   Aortic atherosclerosis (HCC) 10/04/2021   Lesion of tonsil 03/15/2020   AKI (acute kidney injury) (HCC) 03/06/2020   CKD stage G2/A2, GFR 60-89 and albumin creatinine ratio 30-299 mg/g 03/06/2020   Hypomagnesemia 03/06/2020   Polymyalgia rheumatica (HCC) 03/06/2020    Hypercalcemia 03/04/2020   Confusion 03/02/2020   Memory loss 03/02/2020   B12 deficiency 01/09/2019   Vertigo 10/31/2018   Hypokalemia 10/31/2018   Chest pain 10/30/2018   Carpal tunnel syndrome, bilateral 08/11/2018   Hyperlipidemia 11/22/2009   Cerebral artery occlusion with cerebral infarction (HCC) 11/22/2009   BACK PAIN, CHRONIC 11/22/2009   UNSPECIFIED RETINAL DEFECT 11/08/2009   ROTATOR CUFF INJURY, RIGHT SHOULDER 11/08/2009   BRANCH RETINAL VEIN OCCLUSION 08/12/2007   Essential hypertension 08/12/2007    PCP: Nelwyn Salisbury, MD  REFERRING PROVIDER: London Sheer, MD  REFERRING DIAG: [phone] (ICD-10-CM) - Closed displaced fracture of greater tuberosity of right humerus with routine healing, subsequent encounter  THERAPY DIAG:  Right shoulder pain, unspecified chronicity  Muscle weakness (generalized)  Other abnormalities of gait and mobility  Rationale for Evaluation and Treatment: Rehabilitation  ONSET DATE: 09/10/2022  SUBJECTIVE:  SUBJECTIVE STATEMENT: I'm doing OK. I had a hard time doing all of my exercises, it really wore me out.   Hand dominance: Right  PERTINENT HISTORY: Polymyalgia Rheumatica (PMR), hypertension, hyperlipidemia, previous CVA   PAIN:  Are you having pain? Yes: NPRS scale: 6-7/10 Pain location: right shoulder Pain description: cutting/pulling Aggravating factors: certain movements Relieving factors: rest/not moving  PRECAUTIONS: Fall Per Dr Willia Craze on 11/01/2022:  Okay to start active and passive range of motion. In 3 weeks, can start strengthening exercises.  WEIGHT BEARING RESTRICTIONS: No  FALLS:  Has patient fallen in last 6 months? Yes. Number of falls 2  LIVING ENVIRONMENT: Lives with: lives with their spouse and lives with their  daughter Lives in: House/apartment Stairs: Yes: External: 1 steps; none Has following equipment at home: Single point cane, Walker - 2 wheeled, and shower chair  OCCUPATION: Retired  PLOF: Independent and Leisure: drive with daughter in parks on Sunday, reading/Bible study  PATIENT GOALS: To improve my movement.  NEXT MD VISIT: Dec 13, 2022 with Dr Christell Constant  OBJECTIVE:   DIAGNOSTIC FINDINGS:  Right shoulder radiograph on 09/10/2022: IMPRESSION:  Acute minimally displaced fracture of the greater tuberosity of the humerus. Question extension of the fracture line across the anatomic neck of the humerus.   PATIENT SURVEYS :  Eval:  FOTO 50% (projected 61% by visit 11)  COGNITION: Overall cognitive status:  patient reports that sometimes she has problems with memory and can get confusion at times      POSTURE: Forward head, rounded shoulders  UPPER EXTREMITY ROM:   11/13/2022 (A/ROM): Right shoulder flexion:  90 degrees Right shoulder abduction:  82 degrees Left shoulder flexion:  115 degrees Left shoulder abduction: 105 degrees  UPPER/LOWER EXTREMITY MMT:  11/13/2022: Right shoulder strength is 3-/5 Left shoulder strength is 3+/5 within available range BLE strength is grossly 4/5 throughout   FUNCTIONAL ASSESSMENT:   11/13/2022: 5 times sit to/from stand:  23.86 sec with UE use and decreased eccentric muscle control with stand to sit Timed Up and Go (TUG):  22.16 sec    TODAY'S TREATMENT:                                                                                                                                         DATE: 11/20/2022 NuStep: Level 4 x 6 minutes- PT present to discuss progress Seated shoulder flexion AA/ROM with cane x10 Scapular retraction x10- tactile cues to reduce scapular elevation Seated marching: x10-tactile cues to reduce hip ER Standing heel raises at barre: 2x10 Overhead pulleys: flexion x3, abduction x2 min Sit to stand: pad in chair  2x8 reps- verbal cueing for technique Seated shoulder flexion x 10   DATE: 11/13/2022 Reviewed HEP Seated shoulder flexion AA/ROM with cane x10 Scapular retraction x10  PATIENT EDUCATION: Education details: Issued HEP Person educated: Patient Education method: Explanation, Facilities manager, and Handouts Education comprehension: returned  demonstration and needs further education  HOME EXERCISE PROGRAM: Access Code: [facility] URL: [email or web address] Date: 11/13/2022 Prepared by: Reather Laurence  Exercises - Supine Shoulder Flexion Extension AAROM with Dowel  - 1-2 x daily - 7 x weekly - 2 sets - 10 reps - Seated Scapular Retraction  - 1-2 x daily - 7 x weekly - 2 sets - 10 reps - Seated Shoulder Flexion Towel Slide at Table Top  - 1-2 x daily - 7 x weekly - 2 sets - 10 reps - Seated Shoulder Abduction Towel Slide at Table Top  - 1-2 x daily - 7 x weekly - 2 sets - 10 reps - Seated March  - 1-2 x daily - 7 x weekly - 2 sets - 10 reps - Seated Long Arc Quad  - 1-2 x daily - 7 x weekly - 2 sets - 10 reps  ASSESSMENT:  CLINICAL IMPRESSION: Pt reports minimal compliance with HEP as doing the exercises wears her out.  Pt tolerated all exercises in the clinic today and required tactile cues for alignment and technique. Pt is challenged with sit to stand and she worked on this from elevated surface.  Patient would benefit from skilled PT to address her functional impairments to allow her to more safely complete activities in her home without increased pain or loss of balance.   OBJECTIVE IMPAIRMENTS: decreased balance, difficulty walking, decreased ROM, decreased strength, postural dysfunction, and pain.   ACTIVITY LIMITATIONS: carrying, lifting, reach over head, and locomotion level  PARTICIPATION LIMITATIONS: cleaning and community activity  PERSONAL FACTORS: Age, Past/current experiences, Time since onset of injury/illness/exacerbation, and 3+ comorbidities: Polymyalgia  Rheumatica, OA, Hx of CVA  are also affecting patient's functional outcome.   REHAB POTENTIAL: Good  CLINICAL DECISION MAKING: Evolving/moderate complexity  EVALUATION COMPLEXITY: Moderate  GOALS: Goals reviewed with patient? Yes  SHORT TERM GOALS: Target date: 11/30/2022  Patient will be independent with initial HEP. Baseline: Goal status: INITIAL  2.  Patient will increase right shoulder flexion and abduction A/ROM to at least 100 degrees. Baseline:  Goal status: INITIAL   LONG TERM GOALS: Target date: 01/04/2023  Patient will be independent with advanced HEP. Baseline:  Goal status: INITIAL  2.  Patient will increase shoulder FOTO to at least 61% to demonstrate improvements in functional mobility. Baseline:  Goal status: INITIAL  3.  Patient will increase shoulder A/ROM to [facility] without increased pain to allow her to place objects in overhead cabinets. Baseline:  Goal status: INITIAL  4.  Patient will increase right shoulder strength to at least 4 to 4+/5 to allow her to perform functional tasks in home. Baseline: 3-/5 Goal status: INITIAL  5.  Patient will improve 5 times sit to/from stand to 17 seconds or less to demonstrate improved functional strength. Baseline: 23.86 sec Goal status: INITIAL  6.  Patient will improve TUG to 14 seconds or less to place patient at a lower risk of falling. Baseline: 22.16 sec Goal status: INITIAL  PLAN: PT FREQUENCY: 2x/week  PT DURATION: 8 weeks  PLANNED INTERVENTIONS: Therapeutic exercises, Therapeutic activity, Neuromuscular re-education, Balance training, Gait training, Patient/Family education, Self Care, Joint mobilization, Joint manipulation, Stair training, Aquatic Therapy, Dry Needling, Electrical stimulation, Spinal manipulation, Spinal mobilization, Cryotherapy, Moist heat, Taping, Ultrasound, Ionotophoresis 4mg /ml Dexamethasone, Manual therapy, and Re-evaluation  PLAN FOR NEXT SESSION: gentle strength and  flexibility, work on balance  Lorrene Reid, PT 11/20/22 9:39 AM   [facility] [address], [address] [place], [place] [address] Phone #  [phone] Fax [phone]

## 2022-11-27 ENCOUNTER — Ambulatory Visit: Payer: Medicare Other | Admitting: Physical Therapy

## 2022-11-27 ENCOUNTER — Encounter: Payer: Self-pay | Admitting: Physical Therapy

## 2022-11-27 DIAGNOSIS — R2689 Other abnormalities of gait and mobility: Secondary | ICD-10-CM

## 2022-11-27 DIAGNOSIS — M25511 Pain in right shoulder: Secondary | ICD-10-CM

## 2022-11-27 DIAGNOSIS — M6281 Muscle weakness (generalized): Secondary | ICD-10-CM

## 2022-11-27 NOTE — Therapy (Signed)
OUTPATIENT PHYSICAL THERAPY TREATMENT   Patient Name: [name] MRN: [id number] DOB:Jan 12, 1946, 77 y.o., female Today's Date: 11/27/2022  END OF SESSION:  PT End of Session - 11/27/22 0852     Visit Number 3    Date for PT Re-Evaluation 01/04/23    Authorization Type [employer] Medicare    Progress Note Due on Visit 10    PT Start Time [phone]    PT Stop Time 0930    PT Time Calculation (min) 43 min    Activity Tolerance Patient tolerated treatment well    Behavior During Therapy WFL for tasks assessed/performed               Past Medical History:  Diagnosis Date   Agatston coronary artery calcium score greater than 400    827 coronary calcium score which is 92nd percentile for age, race and sex matched controls   Anemia    Anxiety    Aortic atherosclerosis (HCC)    Arthritis    Backache, unspecified    Blood transfusion without reported diagnosis    Cataract    bil eyes   Depression    Hemiplegia, unspecified, affecting unspecified side    Lacunar infarction (HCC)    Other and unspecified hyperlipidemia    Polymyalgia (HCC)    Retinal defect, unspecified    tear of retina   Rotator cuff injury    right shoulder   Unspecified essential hypertension    Venous tributary (branch) occlusion of retina    Past Surgical History:  Procedure Laterality Date   ABDOMINAL HYSTERECTOMY     CHOLECYSTECTOMY     COLONOSCOPY  01/11/2010   per Dr. Carman Ching, internal hemorrhoids only, repeat in 10 yrs    SPHINCTEROTOMY     UPPER GASTROINTESTINAL ENDOSCOPY     Patient Active Problem List   Diagnosis Date Noted   Agatston coronary artery calcium score greater than 400 12/30/2021   Dysphagia 10/04/2021   Aortic atherosclerosis (HCC) 10/04/2021   Lesion of tonsil 03/15/2020   AKI (acute kidney injury) (HCC) 03/06/2020   CKD stage G2/A2, GFR 60-89 and albumin creatinine ratio 30-299 mg/g 03/06/2020   Hypomagnesemia 03/06/2020   Polymyalgia rheumatica (HCC) 03/06/2020    Hypercalcemia 03/04/2020   Confusion 03/02/2020   Memory loss 03/02/2020   B12 deficiency 01/09/2019   Vertigo 10/31/2018   Hypokalemia 10/31/2018   Chest pain 10/30/2018   Carpal tunnel syndrome, bilateral 08/11/2018   Hyperlipidemia 11/22/2009   Cerebral artery occlusion with cerebral infarction (HCC) 11/22/2009   BACK PAIN, CHRONIC 11/22/2009   UNSPECIFIED RETINAL DEFECT 11/08/2009   ROTATOR CUFF INJURY, RIGHT SHOULDER 11/08/2009   BRANCH RETINAL VEIN OCCLUSION 08/12/2007   Essential hypertension 08/12/2007    PCP: Nelwyn Salisbury, MD  REFERRING PROVIDER: London Sheer, MD  REFERRING DIAG: [phone] (ICD-10-CM) - Closed displaced fracture of greater tuberosity of right humerus with routine healing, subsequent encounter  THERAPY DIAG:  Right shoulder pain, unspecified chronicity  Muscle weakness (generalized)  Other abnormalities of gait and mobility  Rationale for Evaluation and Treatment: Rehabilitation  ONSET DATE: 09/10/2022  SUBJECTIVE:  SUBJECTIVE STATEMENT: Reaching with my Rt shoulder needed help from my other arm before I even broke it.  I only get pain in the shoulder if I try to move it in a way that doesn't work.    Hand dominance: Right  PERTINENT HISTORY: Polymyalgia Rheumatica (PMR), hypertension, hyperlipidemia, previous CVA   PAIN:  Are you having pain? Yes: NPRS scale: 6-7/10 Pain location: right shoulder Pain description: cutting/pulling Aggravating factors: certain movements Relieving factors: rest/not moving  PRECAUTIONS: Fall Per Dr Willia Craze on 11/01/2022:  Okay to start active and passive range of motion. In 3 weeks, can start strengthening exercises.  WEIGHT BEARING RESTRICTIONS: No  FALLS:  Has patient fallen in last 6 months? Yes. Number of falls  2  LIVING ENVIRONMENT: Lives with: lives with their spouse and lives with their daughter Lives in: House/apartment Stairs: Yes: External: 1 steps; none Has following equipment at home: Single point cane, Walker - 2 wheeled, and shower chair  OCCUPATION: Retired  PLOF: Independent and Leisure: drive with daughter in parks on Sunday, reading/Bible study  PATIENT GOALS: To improve my movement.  NEXT MD VISIT: Dec 13, 2022 with Dr Christell Constant  OBJECTIVE:   DIAGNOSTIC FINDINGS:  Right shoulder radiograph on 09/10/2022: IMPRESSION:  Acute minimally displaced fracture of the greater tuberosity of the humerus. Question extension of the fracture line across the anatomic neck of the humerus.   PATIENT SURVEYS :  Eval:  FOTO 50% (projected 61% by visit 11)  COGNITION: Overall cognitive status:  patient reports that sometimes she has problems with memory and can get confusion at times      POSTURE: Forward head, rounded shoulders  UPPER EXTREMITY ROM:   11/13/2022 (A/ROM): Right shoulder flexion:  90 degrees Right shoulder abduction:  82 degrees Left shoulder flexion:  115 degrees Left shoulder abduction: 105 degrees  UPPER/LOWER EXTREMITY MMT:  11/13/2022: Right shoulder strength is 3-/5 Left shoulder strength is 3+/5 within available range BLE strength is grossly 4/5 throughout   FUNCTIONAL ASSESSMENT:   11/13/2022: 5 times sit to/from stand:  23.86 sec with UE use and decreased eccentric muscle control with stand to sit Timed Up and Go (TUG):  22.16 sec    TODAY'S TREATMENT:                                                                                                                                         DATE: 11/27/2022 Overhead pulleys: flexion x3', abduction x2 min NuStep L4x 6' PT present to monitor Standing bil heel raise x15 and alt march x 20 with bil UE support Sideways walking in parallel bars x3 laps Sit to stand chair + pad no UE use 2x5 Standing yellow  tband bil row and shoulder ext 1x10 each Seated bil ER yellow band 1x15 Seated Rt shoulder ranger trunk lean and reach fwd and scaption x10 each - ranger height to allow 90 deg  at shoulder Updated HEP for yellow tband exercises above   DATE: 11/20/2022 NuStep: Level 4 x 6 minutes- PT present to discuss progress Seated shoulder flexion AA/ROM with cane x10 Scapular retraction x10- tactile cues to reduce scapular elevation Seated marching: x10-tactile cues to reduce hip ER Standing heel raises at barre: 2x10 Overhead pulleys: flexion x3, abduction x2 min Sit to stand: pad in chair 2x8 reps- verbal cueing for technique Seated shoulder flexion x 10   DATE: 11/13/2022 Reviewed HEP Seated shoulder flexion AA/ROM with cane x10 Scapular retraction x10  PATIENT EDUCATION: Education details: Issued HEP Person educated: Patient Education method: Programmer, multimedia, Facilities manager, and Handouts Education comprehension: returned demonstration and needs further education  HOME EXERCISE PROGRAM: Access Code: [facility] URL: [email or web address] Date: 11/27/2022 Prepared by: Loistine Simas Kassidy Frankson  Exercises - Supine Shoulder Flexion Extension AAROM with Dowel  - 1-2 x daily - 7 x weekly - 2 sets - 10 reps - Seated Scapular Retraction  - 1-2 x daily - 7 x weekly - 2 sets - 10 reps - Seated Shoulder Flexion Towel Slide at Table Top  - 1-2 x daily - 7 x weekly - 2 sets - 10 reps - Seated Shoulder Abduction Towel Slide at Table Top  - 1-2 x daily - 7 x weekly - 2 sets - 10 reps - Seated March  - 1-2 x daily - 7 x weekly - 2 sets - 10 reps - Seated Long Arc Quad  - 1-2 x daily - 7 x weekly - 2 sets - 10 reps - Standing Row with Anchored Resistance  - 1 x daily - 7 x weekly - 2 sets - 10 reps - Standing Shoulder Extension with Resistance  - 1 x daily - 7 x weekly - 2 sets - 10 reps - Standing Shoulder External Rotation with Resistance  - 1 x daily - 7 x weekly - 2 sets - 10  reps  ASSESSMENT:  CLINICAL IMPRESSION: Pt able to progress to yellow tband scapular and RC therex which was well tolerated today.  HEP progressed.  She has A/ROM of Rt shoulder to 90 deg at this time.  Pt continues to be fall risk and is challenged with sit to stand and she worked on this from elevated surface.  Patient would benefit from skilled PT to address her functional impairments to allow her to more safely complete activities in her home without increased pain or loss of balance.   OBJECTIVE IMPAIRMENTS: decreased balance, difficulty walking, decreased ROM, decreased strength, postural dysfunction, and pain.   ACTIVITY LIMITATIONS: carrying, lifting, reach over head, and locomotion level  PARTICIPATION LIMITATIONS: cleaning and community activity  PERSONAL FACTORS: Age, Past/current experiences, Time since onset of injury/illness/exacerbation, and 3+ comorbidities: Polymyalgia Rheumatica, OA, Hx of CVA  are also affecting patient's functional outcome.   REHAB POTENTIAL: Good  CLINICAL DECISION MAKING: Evolving/moderate complexity  EVALUATION COMPLEXITY: Moderate  GOALS: Goals reviewed with patient? Yes  SHORT TERM GOALS: Target date: 11/30/2022  Patient will be independent with initial HEP. Baseline: Goal status: ongoing  2.  Patient will increase right shoulder flexion and abduction A/ROM to at least 100 degrees. Baseline:  Goal status: INITIAL   LONG TERM GOALS: Target date: 01/04/2023  Patient will be independent with advanced HEP. Baseline:  Goal status: INITIAL  2.  Patient will increase shoulder FOTO to at least 61% to demonstrate improvements in functional mobility. Baseline:  Goal status: INITIAL  3.  Patient will increase shoulder A/ROM to [facility] without increased pain to  allow her to place objects in overhead cabinets. Baseline:  Goal status: INITIAL  4.  Patient will increase right shoulder strength to at least 4 to 4+/5 to allow her to perform  functional tasks in home. Baseline: 3-/5 Goal status: INITIAL  5.  Patient will improve 5 times sit to/from stand to 17 seconds or less to demonstrate improved functional strength. Baseline: 23.86 sec Goal status: INITIAL  6.  Patient will improve TUG to 14 seconds or less to place patient at a lower risk of falling. Baseline: 22.16 sec Goal status: INITIAL  PLAN: PT FREQUENCY: 2x/week  PT DURATION: 8 weeks  PLANNED INTERVENTIONS: Therapeutic exercises, Therapeutic activity, Neuromuscular re-education, Balance training, Gait training, Patient/Family education, Self Care, Joint mobilization, Joint manipulation, Stair training, Aquatic Therapy, Dry Needling, Electrical stimulation, Spinal manipulation, Spinal mobilization, Cryotherapy, Moist heat, Taping, Ultrasound, Ionotophoresis 4mg /ml Dexamethasone, Manual therapy, and Re-evaluation  PLAN FOR NEXT SESSION: gentle strength and flexibility, work on [employer], PT 11/27/22 11:46 AM   [facility] [address], [address] [place], [place] [address] Phone # [phone] Fax [phone]

## 2022-12-05 ENCOUNTER — Ambulatory Visit: Payer: Medicare Other | Admitting: Physical Therapy

## 2022-12-05 ENCOUNTER — Encounter: Payer: Self-pay | Admitting: Physical Therapy

## 2022-12-05 DIAGNOSIS — M25511 Pain in right shoulder: Secondary | ICD-10-CM

## 2022-12-05 DIAGNOSIS — R2689 Other abnormalities of gait and mobility: Secondary | ICD-10-CM

## 2022-12-05 DIAGNOSIS — M6281 Muscle weakness (generalized): Secondary | ICD-10-CM

## 2022-12-05 NOTE — Therapy (Signed)
OUTPATIENT PHYSICAL THERAPY TREATMENT   Patient Name: [name] MRN: [id number] DOB:05/25/1946, 77 y.o., female Today's Date: 12/05/2022  END OF SESSION:  PT End of Session - 12/05/22 1233     Visit Number 4    Date for PT Re-Evaluation 01/04/23    Authorization Type [employer] Medicare    Progress Note Due on Visit 10    PT Start Time 1233    PT Stop Time 1313    PT Time Calculation (min) 40 min    Activity Tolerance Patient tolerated treatment well    Behavior During Therapy WFL for tasks assessed/performed                Past Medical History:  Diagnosis Date   Agatston coronary artery calcium score greater than 400    827 coronary calcium score which is 92nd percentile for age, race and sex matched controls   Anemia    Anxiety    Aortic atherosclerosis (HCC)    Arthritis    Backache, unspecified    Blood transfusion without reported diagnosis    Cataract    bil eyes   Depression    Hemiplegia, unspecified, affecting unspecified side    Lacunar infarction (HCC)    Other and unspecified hyperlipidemia    Polymyalgia (HCC)    Retinal defect, unspecified    tear of retina   Rotator cuff injury    right shoulder   Unspecified essential hypertension    Venous tributary (branch) occlusion of retina    Past Surgical History:  Procedure Laterality Date   ABDOMINAL HYSTERECTOMY     CHOLECYSTECTOMY     COLONOSCOPY  01/11/2010   per Dr. Carman Ching, internal hemorrhoids only, repeat in 10 yrs    SPHINCTEROTOMY     UPPER GASTROINTESTINAL ENDOSCOPY     Patient Active Problem List   Diagnosis Date Noted   Agatston coronary artery calcium score greater than 400 12/30/2021   Dysphagia 10/04/2021   Aortic atherosclerosis (HCC) 10/04/2021   Lesion of tonsil 03/15/2020   AKI (acute kidney injury) (HCC) 03/06/2020   CKD stage G2/A2, GFR 60-89 and albumin creatinine ratio 30-299 mg/g 03/06/2020   Hypomagnesemia 03/06/2020   Polymyalgia rheumatica (HCC) 03/06/2020    Hypercalcemia 03/04/2020   Confusion 03/02/2020   Memory loss 03/02/2020   B12 deficiency 01/09/2019   Vertigo 10/31/2018   Hypokalemia 10/31/2018   Chest pain 10/30/2018   Carpal tunnel syndrome, bilateral 08/11/2018   Hyperlipidemia 11/22/2009   Cerebral artery occlusion with cerebral infarction (HCC) 11/22/2009   BACK PAIN, CHRONIC 11/22/2009   UNSPECIFIED RETINAL DEFECT 11/08/2009   ROTATOR CUFF INJURY, RIGHT SHOULDER 11/08/2009   BRANCH RETINAL VEIN OCCLUSION 08/12/2007   Essential hypertension 08/12/2007    PCP: Nelwyn Salisbury, MD  REFERRING PROVIDER: London Sheer, MD  REFERRING DIAG: [phone] (ICD-10-CM) - Closed displaced fracture of greater tuberosity of right humerus with routine healing, subsequent encounter  THERAPY DIAG:  Right shoulder pain, unspecified chronicity  Muscle weakness (generalized)  Other abnormalities of gait and mobility  Rationale for Evaluation and Treatment: Rehabilitation  ONSET DATE: 09/10/2022  SUBJECTIVE:  SUBJECTIVE STATEMENT: I drove today which I haven't since Oct.  It was hard for me to use the Rt arm to adjust the mirrors. I have been having flare ups of my PMR so my overall mobility is harder. My daily tasks take me so long to do so I am doing my HEP but not all of it.  Hand dominance: Right  PERTINENT HISTORY: Polymyalgia Rheumatica (PMR), hypertension, hyperlipidemia, previous CVA   PAIN:  Are you having pain? Yes: NPRS scale: 5-6/10 Pain location: right shoulder Pain description: cutting/pulling Aggravating factors: certain movements Relieving factors: rest/not moving  PRECAUTIONS: Fall Per Dr Willia Craze on 11/01/2022:  Okay to start active and passive range of motion. In 3 weeks, can start strengthening exercises.  WEIGHT BEARING  RESTRICTIONS: No  FALLS:  Has patient fallen in last 6 months? Yes. Number of falls 2  LIVING ENVIRONMENT: Lives with: lives with their spouse and lives with their daughter Lives in: House/apartment Stairs: Yes: External: 1 steps; none Has following equipment at home: Single point cane, Walker - 2 wheeled, and shower chair  OCCUPATION: Retired  PLOF: Independent and Leisure: drive with daughter in parks on Sunday, reading/Bible study  PATIENT GOALS: To improve my movement.  NEXT MD VISIT: Dec 13, 2022 with Dr Christell Constant  OBJECTIVE:   DIAGNOSTIC FINDINGS:  Right shoulder radiograph on 09/10/2022: IMPRESSION:  Acute minimally displaced fracture of the greater tuberosity of the humerus. Question extension of the fracture line across the anatomic neck of the humerus.   PATIENT SURVEYS :  Eval:  FOTO 50% (projected 61% by visit 11)  COGNITION: Overall cognitive status:  patient reports that sometimes she has problems with memory and can get confusion at times      POSTURE: Forward head, rounded shoulders  UPPER EXTREMITY ROM:   11/13/2022 (A/ROM): Right shoulder flexion:  90 degrees Right shoulder abduction:  82 degrees Left shoulder flexion:  115 degrees Left shoulder abduction: 105 degrees  UPPER/LOWER EXTREMITY MMT:  11/13/2022: Right shoulder strength is 3-/5 Left shoulder strength is 3+/5 within available range BLE strength is grossly 4/5 throughout   FUNCTIONAL ASSESSMENT:   11/13/2022: 5 times sit to/from stand:  23.86 sec with UE use and decreased eccentric muscle control with stand to sit Timed Up and Go (TUG):  22.16 sec    TODAY'S TREATMENT:                                                                                                                                         DATE: 12/05/2022 NuStep L4 x6' PT present to monitor and discuss status Seated Rt ranger flexion, scaption and circles x10 each - ranger height to allow 90 deg at shoulder Sit to stand  chair + pad no UE use 2x5 Rt finger ladder x5 reps - initial rep reached 23, final rep reached 25 Seated yellow tband 2x10 bil shoulder ER Seated Rt  A/ROM shoulder flexion 2x5, PT manual facilitation of scapular mechanics Supine AA/ROM with dowel: flexion, scaption, ER/IR x 10 each Standing yellow tband bil row and shoulder ext 2x10 each Sideways walking in parallel bars x3 laps Bil heel raise with rail x 15 Alt standing march with rail x 20 Rt shoulder wall slides flexion and scaption x 10 each   DATE: 11/27/2022 Overhead pulleys: flexion x3', abduction x2 min NuStep L4x 6' PT present to monitor Standing bil heel raise x15 and alt march x 20 with bil UE support Sideways walking in parallel bars x3 laps Sit to stand chair + pad no UE use 2x5 Standing yellow tband bil row and shoulder ext 1x10 each Seated bil ER yellow band 1x15 Seated Rt shoulder ranger trunk lean and reach fwd and scaption x10 each - ranger height to allow 90 deg at shoulder Updated HEP for yellow tband exercises above   DATE: 11/20/2022 NuStep: Level 4 x 6 minutes- PT present to discuss progress Seated shoulder flexion AA/ROM with cane x10 Scapular retraction x10- tactile cues to reduce scapular elevation Seated marching: x10-tactile cues to reduce hip ER Standing heel raises at barre: 2x10 Overhead pulleys: flexion x3, abduction x2 min Sit to stand: pad in chair 2x8 reps- verbal cueing for technique Seated shoulder flexion x 10    PATIENT EDUCATION: Education details: Issued HEP Person educated: Patient Education method: Programmer, multimedia, Facilities manager, and Handouts Education comprehension: returned demonstration and needs further education  HOME EXERCISE PROGRAM: Access Code: [facility] URL: [email or web address] Date: 11/27/2022 Prepared by: Loistine Simas Aviella Disbrow  Exercises - Supine Shoulder Flexion Extension AAROM with Dowel  - 1-2 x daily - 7 x weekly - 2 sets - 10 reps - Seated Scapular  Retraction  - 1-2 x daily - 7 x weekly - 2 sets - 10 reps - Seated Shoulder Flexion Towel Slide at Table Top  - 1-2 x daily - 7 x weekly - 2 sets - 10 reps - Seated Shoulder Abduction Towel Slide at Table Top  - 1-2 x daily - 7 x weekly - 2 sets - 10 reps - Seated March  - 1-2 x daily - 7 x weekly - 2 sets - 10 reps - Seated Long Arc Quad  - 1-2 x daily - 7 x weekly - 2 sets - 10 reps - Standing Row with Anchored Resistance  - 1 x daily - 7 x weekly - 2 sets - 10 reps - Standing Shoulder Extension with Resistance  - 1 x daily - 7 x weekly - 2 sets - 10 reps - Standing Shoulder External Rotation with Resistance  - 1 x daily - 7 x weekly - 2 sets - 10 reps  ASSESSMENT:  CLINICAL IMPRESSION: Pt continues to demo Rt shoulder AA/ROM to approx 120 deg in standing and 140 deg in supine.  Her A/ROM of Rt shoulder flexion is approx 80-90 deg today.  She is becoming more away of scapular hiking avoidance with efforts to gain ROM.  She felt her overall mobility has been challenged by flare up of PMR.  She was able to ambulate within gym without AD with careful slow pace.  She ambulates with wide BOS and lacks trunk rotation.  Close supervision in transitioning between therex provided by PT for safety.   OBJECTIVE IMPAIRMENTS: decreased balance, difficulty walking, decreased ROM, decreased strength, postural dysfunction, and pain.   ACTIVITY LIMITATIONS: carrying, lifting, reach over head, and locomotion level  PARTICIPATION LIMITATIONS: cleaning and community activity  PERSONAL FACTORS: Age, Past/current  experiences, Time since onset of injury/illness/exacerbation, and 3+ comorbidities: Polymyalgia Rheumatica, OA, Hx of CVA  are also affecting patient's functional outcome.   REHAB POTENTIAL: Good  CLINICAL DECISION MAKING: Evolving/moderate complexity  EVALUATION COMPLEXITY: Moderate  GOALS: Goals reviewed with patient? Yes  SHORT TERM GOALS: Target date: 11/30/2022  Patient will be independent  with initial HEP. Baseline: Goal status: met 5/22  2.  Patient will increase right shoulder flexion and abduction A/ROM to at least 100 degrees. Baseline:  Goal status:partially met 5/22 - 90 deg A/ROM   LONG TERM GOALS: Target date: 01/04/2023  Patient will be independent with advanced HEP. Baseline:  Goal status: INITIAL  2.  Patient will increase shoulder FOTO to at least 61% to demonstrate improvements in functional mobility. Baseline:  Goal status: INITIAL  3.  Patient will increase shoulder A/ROM to [facility] without increased pain to allow her to place objects in overhead cabinets. Baseline:  Goal status: ongoing  4.  Patient will increase right shoulder strength to at least 4 to 4+/5 to allow her to perform functional tasks in home. Baseline: 3-/5 Goal status: ongoing  5.  Patient will improve 5 times sit to/from stand to 17 seconds or less to demonstrate improved functional strength. Baseline: 23.86 sec Goal status: INITIAL  6.  Patient will improve TUG to 14 seconds or less to place patient at a lower risk of falling. Baseline: 22.16 sec Goal status: INITIAL  PLAN: PT FREQUENCY: 2x/week  PT DURATION: 8 weeks  PLANNED INTERVENTIONS: Therapeutic exercises, Therapeutic activity, Neuromuscular re-education, Balance training, Gait training, Patient/Family education, Self Care, Joint mobilization, Joint manipulation, Stair training, Aquatic Therapy, Dry Needling, Electrical stimulation, Spinal manipulation, Spinal mobilization, Cryotherapy, Moist heat, Taping, Ultrasound, Ionotophoresis 4mg /ml Dexamethasone, Manual therapy, and Re-evaluation  PLAN FOR NEXT SESSION: gentle strength and flexibility, work on [employer], PT 12/05/22 1:25 PM    [facility] [address] [place], [place] [address] Phone # [phone] Fax [phone]

## 2022-12-07 ENCOUNTER — Ambulatory Visit: Payer: Medicare Other | Admitting: Rehabilitative and Restorative Service Providers"

## 2022-12-11 ENCOUNTER — Ambulatory Visit: Payer: Medicare Other | Admitting: Rehabilitative and Restorative Service Providers"

## 2022-12-11 ENCOUNTER — Encounter: Payer: Self-pay | Admitting: Rehabilitative and Restorative Service Providers"

## 2022-12-11 DIAGNOSIS — M25511 Pain in right shoulder: Secondary | ICD-10-CM | POA: Diagnosis not present

## 2022-12-11 DIAGNOSIS — M6281 Muscle weakness (generalized): Secondary | ICD-10-CM

## 2022-12-11 DIAGNOSIS — R2689 Other abnormalities of gait and mobility: Secondary | ICD-10-CM

## 2022-12-11 NOTE — Therapy (Signed)
OUTPATIENT PHYSICAL THERAPY TREATMENT   Patient Name: [name] MRN: [id number] DOB:15-May-1946, 77 y.o., female Today's Date: 12/11/2022  END OF SESSION:  PT End of Session - 12/11/22 1236     Visit Number 5    Date for PT Re-Evaluation 01/04/23    Authorization Type [employer] Medicare    Progress Note Due on Visit 10    PT Start Time 1230    PT Stop Time 1310    PT Time Calculation (min) 40 min    Activity Tolerance Patient tolerated treatment well    Behavior During Therapy WFL for tasks assessed/performed                Past Medical History:  Diagnosis Date   Agatston coronary artery calcium score greater than 400    827 coronary calcium score which is 92nd percentile for age, race and sex matched controls   Anemia    Anxiety    Aortic atherosclerosis (HCC)    Arthritis    Backache, unspecified    Blood transfusion without reported diagnosis    Cataract    bil eyes   Depression    Hemiplegia, unspecified, affecting unspecified side    Lacunar infarction (HCC)    Other and unspecified hyperlipidemia    Polymyalgia (HCC)    Retinal defect, unspecified    tear of retina   Rotator cuff injury    right shoulder   Unspecified essential hypertension    Venous tributary (branch) occlusion of retina    Past Surgical History:  Procedure Laterality Date   ABDOMINAL HYSTERECTOMY     CHOLECYSTECTOMY     COLONOSCOPY  01/11/2010   per Dr. Carman Ching, internal hemorrhoids only, repeat in 10 yrs    SPHINCTEROTOMY     UPPER GASTROINTESTINAL ENDOSCOPY     Patient Active Problem List   Diagnosis Date Noted   Agatston coronary artery calcium score greater than 400 12/30/2021   Dysphagia 10/04/2021   Aortic atherosclerosis (HCC) 10/04/2021   Lesion of tonsil 03/15/2020   AKI (acute kidney injury) (HCC) 03/06/2020   CKD stage G2/A2, GFR 60-89 and albumin creatinine ratio 30-299 mg/g 03/06/2020   Hypomagnesemia 03/06/2020   Polymyalgia rheumatica (HCC) 03/06/2020    Hypercalcemia 03/04/2020   Confusion 03/02/2020   Memory loss 03/02/2020   B12 deficiency 01/09/2019   Vertigo 10/31/2018   Hypokalemia 10/31/2018   Chest pain 10/30/2018   Carpal tunnel syndrome, bilateral 08/11/2018   Hyperlipidemia 11/22/2009   Cerebral artery occlusion with cerebral infarction (HCC) 11/22/2009   BACK PAIN, CHRONIC 11/22/2009   UNSPECIFIED RETINAL DEFECT 11/08/2009   ROTATOR CUFF INJURY, RIGHT SHOULDER 11/08/2009   BRANCH RETINAL VEIN OCCLUSION 08/12/2007   Essential hypertension 08/12/2007    PCP: Nelwyn Salisbury, MD  REFERRING PROVIDER: London Sheer, MD  REFERRING DIAG: [phone] (ICD-10-CM) - Closed displaced fracture of greater tuberosity of right humerus with routine healing, subsequent encounter  THERAPY DIAG:  Right shoulder pain, unspecified chronicity  Muscle weakness (generalized)  Other abnormalities of gait and mobility  Rationale for Evaluation and Treatment: Rehabilitation  ONSET DATE: 09/10/2022  SUBJECTIVE:  SUBJECTIVE STATEMENT: Patient denies pain currently.  Patient states that she is still having some shoulder tightness, especially in abduction.  Patient only states brief pain with abduction, otherwise denies pain.  States that she goes to see Dr Christell Constant on Thursday.  Hand dominance: Right  PERTINENT HISTORY: Polymyalgia Rheumatica (PMR), hypertension, hyperlipidemia, previous CVA   PAIN:  Are you having pain? Yes: NPRS scale: currently 0/10 Pain location: right shoulder Pain description: cutting/pulling Aggravating factors: certain movements Relieving factors: rest/not moving  PRECAUTIONS: Fall Per Dr Willia Craze on 11/01/2022:  Okay to start active and passive range of motion. In 3 weeks, can start strengthening exercises.  WEIGHT BEARING  RESTRICTIONS: No  FALLS:  Has patient fallen in last 6 months? Yes. Number of falls 2  LIVING ENVIRONMENT: Lives with: lives with their spouse and lives with their daughter Lives in: House/apartment Stairs: Yes: External: 1 steps; none Has following equipment at home: Single point cane, Walker - 2 wheeled, and shower chair  OCCUPATION: Retired  PLOF: Independent and Leisure: drive with daughter in parks on Sunday, reading/Bible study  PATIENT GOALS: To improve my movement.  NEXT MD VISIT: Dec 13, 2022 with Dr Christell Constant  OBJECTIVE:   DIAGNOSTIC FINDINGS:  Right shoulder radiograph on 09/10/2022: IMPRESSION:  Acute minimally displaced fracture of the greater tuberosity of the humerus. Question extension of the fracture line across the anatomic neck of the humerus.   PATIENT SURVEYS :  Eval:  FOTO 50% (projected 61% by visit 11)  COGNITION: Overall cognitive status:  patient reports that sometimes she has problems with memory and can get confusion at times      POSTURE: Forward head, rounded shoulders  UPPER EXTREMITY ROM:   11/13/2022 (A/ROM): Right shoulder flexion:  90 degrees Right shoulder abduction:  82 degrees Left shoulder flexion:  115 degrees Left shoulder abduction: 105 degrees  12/11/2022 (A/ROM): Right shoulder flexion:  100 degrees Right shoulder abduction:  97 degrees  UPPER/LOWER EXTREMITY MMT:  11/13/2022: Right shoulder strength is 3-/5 Left shoulder strength is 3+/5 within available range BLE strength is grossly 4/5 throughout   FUNCTIONAL ASSESSMENT:   11/13/2022: 5 times sit to/from stand:  23.86 sec with UE use and decreased eccentric muscle control with stand to sit Timed Up and Go (TUG):  22.16 sec    TODAY'S TREATMENT:                                                                                                                                          DATE: 12/11/2022 NuStep L4 x6' PT present to monitor and discuss status Shoulder  pulley for flexion and abduction x2 min each Sit to stand chair + pad no UE use 2x5 Standing wall wash for shoulder flexion and abduction x10 each right UE Supine shoulder flexion with 1# x10 right UE Supine serratus punch with 1# x10 Right UE Supine shoulder horizontal abduction and  ER with yellow tband 2x10 bilateral   DATE: 12/05/2022 NuStep L4 x6' PT present to monitor and discuss status Seated Rt ranger flexion, scaption and circles x10 each - ranger height to allow 90 deg at shoulder Sit to stand chair + pad no UE use 2x5 Rt finger ladder x5 reps - initial rep reached 23, final rep reached 25 Seated yellow tband 2x10 bil shoulder ER Seated Rt A/ROM shoulder flexion 2x5, PT manual facilitation of scapular mechanics Supine AA/ROM with dowel: flexion, scaption, ER/IR x 10 each Standing yellow tband bil row and shoulder ext 2x10 each Sideways walking in parallel bars x3 laps Bil heel raise with rail x 15 Alt standing march with rail x 20 Rt shoulder wall slides flexion and scaption x 10 each   DATE: 11/27/2022 Overhead pulleys: flexion x3', abduction x2 min NuStep L4x 6' PT present to monitor Standing bil heel raise x15 and alt march x 20 with bil UE support Sideways walking in parallel bars x3 laps Sit to stand chair + pad no UE use 2x5 Standing yellow tband bil row and shoulder ext 1x10 each Seated bil ER yellow band 1x15 Seated Rt shoulder ranger trunk lean and reach fwd and scaption x10 each - ranger height to allow 90 deg at shoulder Updated HEP for yellow tband exercises above     PATIENT EDUCATION: Education details: Issued HEP Person educated: Patient Education method: Explanation, Facilities manager, and Handouts Education comprehension: returned demonstration and needs further education  HOME EXERCISE PROGRAM: Access Code: [facility] URL: [email or web address] Date: 11/27/2022 Prepared by: Loistine Simas [place]  Exercises - Supine Shoulder Flexion  Extension AAROM with Dowel  - 1-2 x daily - 7 x weekly - 2 sets - 10 reps - Seated Scapular Retraction  - 1-2 x daily - 7 x weekly - 2 sets - 10 reps - Seated Shoulder Flexion Towel Slide at Table Top  - 1-2 x daily - 7 x weekly - 2 sets - 10 reps - Seated Shoulder Abduction Towel Slide at Table Top  - 1-2 x daily - 7 x weekly - 2 sets - 10 reps - Seated March  - 1-2 x daily - 7 x weekly - 2 sets - 10 reps - Seated Long Arc Quad  - 1-2 x daily - 7 x weekly - 2 sets - 10 reps - Standing Row with Anchored Resistance  - 1 x daily - 7 x weekly - 2 sets - 10 reps - Standing Shoulder Extension with Resistance  - 1 x daily - 7 x weekly - 2 sets - 10 reps - Standing Shoulder External Rotation with Resistance  - 1 x daily - 7 x weekly - 2 sets - 10 reps  ASSESSMENT:  CLINICAL IMPRESSION: Ms Heeney presents to skilled PT reporting that she is still having shoulder tightness.  Patient with improved A/ROM vs initial evaluation, but still limited more in right abduction.  Patient able to progress with strengthening with incorporating continued right shoulder ROM.  Patient continues with wide base of support with ambulation with decreased gait velocity.  Patient would benefit from continued skilled PT to continue to progress overall strengthening and shoulder ROM.   OBJECTIVE IMPAIRMENTS: decreased balance, difficulty walking, decreased ROM, decreased strength, postural dysfunction, and pain.   ACTIVITY LIMITATIONS: carrying, lifting, reach over head, and locomotion level  PARTICIPATION LIMITATIONS: cleaning and community activity  PERSONAL FACTORS: Age, Past/current experiences, Time since onset of injury/illness/exacerbation, and 3+ comorbidities: Polymyalgia Rheumatica, OA, Hx of CVA  are also affecting  patient's functional outcome.   REHAB POTENTIAL: Good  CLINICAL DECISION MAKING: Evolving/moderate complexity  EVALUATION COMPLEXITY: Moderate  GOALS: Goals reviewed with patient? Yes  SHORT  TERM GOALS: Target date: 11/30/2022  Patient will be independent with initial HEP. Baseline: Goal status: met 5/22  2.  Patient will increase right shoulder flexion and abduction A/ROM to at least 100 degrees. Baseline:  Goal status: MET for flexion on 12/11/2022, IN PROGRESS for abduction (see above)   LONG TERM GOALS: Target date: 01/04/2023  Patient will be independent with advanced HEP. Baseline:  Goal status: INITIAL  2.  Patient will increase shoulder FOTO to at least 61% to demonstrate improvements in functional mobility. Baseline:  Goal status: INITIAL  3.  Patient will increase shoulder A/ROM to [facility] without increased pain to allow her to place objects in overhead cabinets. Baseline:  Goal status: ongoing  4.  Patient will increase right shoulder strength to at least 4 to 4+/5 to allow her to perform functional tasks in home. Baseline: 3-/5 Goal status: ongoing  5.  Patient will improve 5 times sit to/from stand to 17 seconds or less to demonstrate improved functional strength. Baseline: 23.86 sec Goal status: INITIAL  6.  Patient will improve TUG to 14 seconds or less to place patient at a lower risk of falling. Baseline: 22.16 sec Goal status: INITIAL  PLAN: PT FREQUENCY: 2x/week  PT DURATION: 8 weeks  PLANNED INTERVENTIONS: Therapeutic exercises, Therapeutic activity, Neuromuscular re-education, Balance training, Gait training, Patient/Family education, Self Care, Joint mobilization, Joint manipulation, Stair training, Aquatic Therapy, Dry Needling, Electrical stimulation, Spinal manipulation, Spinal mobilization, Cryotherapy, Moist heat, Taping, Ultrasound, Ionotophoresis 4mg /ml Dexamethasone, Manual therapy, and Re-evaluation  PLAN FOR NEXT SESSION: Assess and progress HEP as indicated, gentle strength and flexibility, work on balance   Reather Laurence, PT 12/11/22 1:33 PM  [facility] [address] [place], [place]  [address] Phone # [phone] Fax [phone]

## 2022-12-13 ENCOUNTER — Other Ambulatory Visit (INDEPENDENT_AMBULATORY_CARE_PROVIDER_SITE_OTHER): Payer: Medicare Other

## 2022-12-13 ENCOUNTER — Encounter: Payer: Medicare Other | Admitting: Rehabilitative and Restorative Service Providers"

## 2022-12-13 ENCOUNTER — Ambulatory Visit: Payer: Medicare Other | Admitting: Orthopedic Surgery

## 2022-12-13 DIAGNOSIS — M25511 Pain in right shoulder: Secondary | ICD-10-CM

## 2022-12-13 DIAGNOSIS — S42251D Displaced fracture of greater tuberosity of right humerus, subsequent encounter for fracture with routine healing: Secondary | ICD-10-CM | POA: Diagnosis not present

## 2022-12-13 NOTE — Progress Notes (Signed)
Orthopedic Surgery Progress Note     Assessment: Patient is a 77 y.o. female with right greater tuberosity fracture Date of injury: 09/10/2022 (~3 months from date of injury)      Plan: -Operative plans: none -Weight bearing status: as tolerated -Continue with home exercise program -OTC medications for pain control -If she is not doing any better at our next visit, will order MRI of the shoulder to evaluate for rotator cuff tear -Return to office in 12 weeks, repeat XRs at next visit: Grashey/axillary/scapular Y right shoulder   ___________________________________________________________________________   Subjective: Patient is a 77 year old female with past medical history of hypertension, hyperlipidemia, history of stroke who had a ground-level fall and sustained a right greater tuberosity fracture on 09/10/2022. Patient is not having any pain in her shoulder at rest. She does have some pain if she lifts a weight or elevates her arm greater than 80 degrees. The only functional issue she has noticed is some difficulty in the shower with washing her hair, but she is still able to do it. Denies paresthesias/numbness.    Physical Exam:   General: no acute distress, appears stated age Neurologic: alert, answering questions appropriately, following commands Respiratory: unlabored breathing on room air, symmetric chest rise Psychiatric: appropriate affect, normal cadence to speech   MSK:    -Right upper extremity             No tenderness to palpation over extremity, no gross deformity Fires deltoid, biceps, triceps, wrist extensors, wrist flexors, finger extensors, finger flexors             AIN/PIN/IO intact             Sensation intact to light touch in median/ulnar/radial/axillary nerve distributions  Active ROM shows FF to 90 degrees (passive to 120), ER to 30, IR to belt line             Hand warm and well perfused, palpable radial pulse   XR of the right shoulder from 12/13/2022  were independently reviewed and interpreted, showing  a displaced and comminuted greater tuberosity fracture. It appears in similar position to last films on 11/01/2022. No other fractures seen. No dislocation seen.  Decreased joint space and osteophyte seen at the inferior aspect of the proximal humerus.     Patient name: [name] Patient MRN: [id number] Date: 12/13/22

## 2022-12-18 ENCOUNTER — Encounter: Payer: Self-pay | Admitting: Rehabilitative and Restorative Service Providers"

## 2022-12-18 ENCOUNTER — Ambulatory Visit: Payer: Medicare Other | Attending: Orthopedic Surgery | Admitting: Rehabilitative and Restorative Service Providers"

## 2022-12-18 DIAGNOSIS — R2689 Other abnormalities of gait and mobility: Secondary | ICD-10-CM | POA: Insufficient documentation

## 2022-12-18 DIAGNOSIS — M25511 Pain in right shoulder: Secondary | ICD-10-CM | POA: Insufficient documentation

## 2022-12-18 DIAGNOSIS — M6281 Muscle weakness (generalized): Secondary | ICD-10-CM | POA: Diagnosis present

## 2022-12-18 NOTE — Therapy (Signed)
OUTPATIENT PHYSICAL THERAPY TREATMENT NOTE   Patient Name: [name] MRN: [id number] DOB:11/25/1945, 77 y.o., female Today's Date: 12/18/2022  END OF SESSION:  PT End of Session - 12/18/22 1234     Visit Number 6    Date for PT Re-Evaluation 01/04/23    Authorization Type [employer]    Progress Note Due on Visit 10    PT Start Time 1230    PT Stop Time 1310    PT Time Calculation (min) 40 min    Activity Tolerance Patient tolerated treatment well    Behavior During Therapy WFL for tasks assessed/performed                Past Medical History:  Diagnosis Date   Agatston coronary artery calcium score greater than 400    827 coronary calcium score which is 92nd percentile for age, race and sex matched controls   Anemia    Anxiety    Aortic atherosclerosis (HCC)    Arthritis    Backache, unspecified    Blood transfusion without reported diagnosis    Cataract    bil eyes   Depression    Hemiplegia, unspecified, affecting unspecified side    Lacunar infarction (HCC)    Other and unspecified hyperlipidemia    Polymyalgia (HCC)    Retinal defect, unspecified    tear of retina   Rotator cuff injury    right shoulder   Unspecified essential hypertension    Venous tributary (branch) occlusion of retina    Past Surgical History:  Procedure Laterality Date   ABDOMINAL HYSTERECTOMY     CHOLECYSTECTOMY     COLONOSCOPY  01/11/2010   per Dr. Carman Ching, internal hemorrhoids only, repeat in 10 yrs    SPHINCTEROTOMY     UPPER GASTROINTESTINAL ENDOSCOPY     Patient Active Problem List   Diagnosis Date Noted   Agatston coronary artery calcium score greater than 400 12/30/2021   Dysphagia 10/04/2021   Aortic atherosclerosis (HCC) 10/04/2021   Lesion of tonsil 03/15/2020   AKI (acute kidney injury) (HCC) 03/06/2020   CKD stage G2/A2, GFR 60-89 and albumin creatinine ratio 30-299 mg/g 03/06/2020   Hypomagnesemia 03/06/2020   Polymyalgia rheumatica (HCC)  03/06/2020   Hypercalcemia 03/04/2020   Confusion 03/02/2020   Memory loss 03/02/2020   B12 deficiency 01/09/2019   Vertigo 10/31/2018   Hypokalemia 10/31/2018   Chest pain 10/30/2018   Carpal tunnel syndrome, bilateral 08/11/2018   Hyperlipidemia 11/22/2009   Cerebral artery occlusion with cerebral infarction (HCC) 11/22/2009   BACK PAIN, CHRONIC 11/22/2009   UNSPECIFIED RETINAL DEFECT 11/08/2009   ROTATOR CUFF INJURY, RIGHT SHOULDER 11/08/2009   BRANCH RETINAL VEIN OCCLUSION 08/12/2007   Essential hypertension 08/12/2007    PCP: Nelwyn Salisbury, MD  REFERRING PROVIDER: London Sheer, MD  REFERRING DIAG: [phone] (ICD-10-CM) - Closed displaced fracture of greater tuberosity of right humerus with routine healing, subsequent encounter  THERAPY DIAG:  Right shoulder pain, unspecified chronicity  Muscle weakness (generalized)  Other abnormalities of gait and mobility  Rationale for Evaluation and Treatment: Rehabilitation  ONSET DATE: 09/10/2022  SUBJECTIVE:  SUBJECTIVE STATEMENT: Pt reports that she did have some pain with some of the supine exercises last session.  Patient reports that she returns to Dr Christell Constant in 3 months.  Hand dominance: Right  PERTINENT HISTORY: Polymyalgia Rheumatica (PMR), hypertension, hyperlipidemia, previous CVA   PAIN:  Are you having pain? Yes: NPRS scale: 0-3/10 Pain location: right shoulder Pain description: cutting/pulling Aggravating factors: certain movements Relieving factors: rest/not moving  PRECAUTIONS: Fall Per Dr Willia Craze on 11/01/2022:  Okay to start active and passive range of motion. In 3 weeks, can start strengthening exercises.  WEIGHT BEARING RESTRICTIONS: No  FALLS:  Has patient fallen in last 6 months? Yes. Number of falls  2  LIVING ENVIRONMENT: Lives with: lives with their spouse and lives with their daughter Lives in: House/apartment Stairs: Yes: External: 1 steps; none Has following equipment at home: Single point cane, Walker - 2 wheeled, and shower chair  OCCUPATION: Retired  PLOF: Independent and Leisure: drive with daughter in parks on Sunday, reading/Bible study  PATIENT GOALS: To improve my movement.  NEXT MD VISIT: March 09, 2023 with Dr Christell Constant  OBJECTIVE:   DIAGNOSTIC FINDINGS:  Right shoulder radiograph on 09/10/2022: IMPRESSION:  Acute minimally displaced fracture of the greater tuberosity of the humerus. Question extension of the fracture line across the anatomic neck of the humerus.   PATIENT SURVEYS :  Eval:  FOTO 50% (projected 61% by visit 11)  COGNITION: Overall cognitive status:  patient reports that sometimes she has problems with memory and can get confusion at times      POSTURE: Forward head, rounded shoulders  UPPER EXTREMITY ROM:   11/13/2022 (A/ROM): Right shoulder flexion:  90 degrees Right shoulder abduction:  82 degrees Left shoulder flexion:  115 degrees Left shoulder abduction: 105 degrees  12/11/2022 (A/ROM): Right shoulder flexion:  100 degrees Right shoulder abduction:  97 degrees  UPPER/LOWER EXTREMITY MMT:  11/13/2022: Right shoulder strength is 3-/5 Left shoulder strength is 3+/5 within available range BLE strength is grossly 4/5 throughout   FUNCTIONAL ASSESSMENT:   11/13/2022: 5 times sit to/from stand:  23.86 sec with UE use and decreased eccentric muscle control with stand to sit Timed Up and Go (TUG):  22.16 sec    TODAY'S TREATMENT:                                                                                                                                          DATE: 12/18/2022 Shoulder pulley for flexion and abduction x3 min each Standing wall wash for shoulder flexion and abduction 2x10 each right UE Supine shoulder flexion  with 1# on cane 2x10  Supine chest press with 1# on cane 2x10 Supine right shoulder AA/ROM with cane 2x10 Supine shoulder horizontal abduction and ER with yellow tband 2x10 bilateral Sit to/from stand holding 3# dumbbell 2x5 Seated right UE ranger for CW and CCW "big" circles x10 each  direction Side stepping in parallel bars with occasional UE support x3 each direction   DATE: 12/11/2022 NuStep L4 x6' PT present to monitor and discuss status Shoulder pulley for flexion and abduction x2 min each Sit to stand chair + pad no UE use 2x5 Standing wall wash for shoulder flexion and abduction x10 each right UE Supine shoulder flexion with 1# x10 right UE Supine serratus punch with 1# x10 Right UE Supine shoulder horizontal abduction and ER with yellow tband 2x10 bilateral   DATE: 12/05/2022 NuStep L4 x6' PT present to monitor and discuss status Seated Rt ranger flexion, scaption and circles x10 each - ranger height to allow 90 deg at shoulder Sit to stand chair + pad no UE use 2x5 Rt finger ladder x5 reps - initial rep reached 23, final rep reached 25 Seated yellow tband 2x10 bil shoulder ER Seated Rt A/ROM shoulder flexion 2x5, PT manual facilitation of scapular mechanics Supine AA/ROM with dowel: flexion, scaption, ER/IR x 10 each Standing yellow tband bil row and shoulder ext 2x10 each Sideways walking in parallel bars x3 laps Bil heel raise with rail x 15 Alt standing march with rail x 20 Rt shoulder wall slides flexion and scaption x 10 each     PATIENT EDUCATION: Education details: Issued HEP Person educated: Patient Education method: Programmer, multimedia, Facilities manager, and Handouts Education comprehension: returned demonstration and needs further education  HOME EXERCISE PROGRAM: Access Code: [facility] URL: [email or web address] Date: 11/27/2022 Prepared by: Loistine Simas Beuhring  Exercises - Supine Shoulder Flexion Extension AAROM with Dowel  - 1-2 x daily - 7 x weekly  - 2 sets - 10 reps - Seated Scapular Retraction  - 1-2 x daily - 7 x weekly - 2 sets - 10 reps - Seated Shoulder Flexion Towel Slide at Table Top  - 1-2 x daily - 7 x weekly - 2 sets - 10 reps - Seated Shoulder Abduction Towel Slide at Table Top  - 1-2 x daily - 7 x weekly - 2 sets - 10 reps - Seated March  - 1-2 x daily - 7 x weekly - 2 sets - 10 reps - Seated Long Arc Quad  - 1-2 x daily - 7 x weekly - 2 sets - 10 reps - Standing Row with Anchored Resistance  - 1 x daily - 7 x weekly - 2 sets - 10 reps - Standing Shoulder Extension with Resistance  - 1 x daily - 7 x weekly - 2 sets - 10 reps - Standing Shoulder External Rotation with Resistance  - 1 x daily - 7 x weekly - 2 sets - 10 reps  ASSESSMENT:  CLINICAL IMPRESSION: Ms Soldan presents to skilled PT reporting some pain from last session, but states that she has been compliant with HEP.  Patient able to progress in session with increased right shoulder A/ROM and AA/ROM in session with use of cane or ranger.  Progressed sit to stand with holding onto 3# dumbbell and patient able to complete without assistance with increased time.  Patient states that her copay is more than she originally thought, so she may need to limit some of her visits.  Encouraged pt to continue to perform HEP and utilize her right arm in functional ways to gradually increase her strength.  Patient verbalizes understanding.   OBJECTIVE IMPAIRMENTS: decreased balance, difficulty walking, decreased ROM, decreased strength, postural dysfunction, and pain.   ACTIVITY LIMITATIONS: carrying, lifting, reach over head, and locomotion level  PARTICIPATION LIMITATIONS: cleaning and community activity  PERSONAL  FACTORS: Age, Past/current experiences, Time since onset of injury/illness/exacerbation, and 3+ comorbidities: Polymyalgia Rheumatica, OA, Hx of CVA  are also affecting patient's functional outcome.   REHAB POTENTIAL: Good  CLINICAL DECISION MAKING:  Evolving/moderate complexity  EVALUATION COMPLEXITY: Moderate  GOALS: Goals reviewed with patient? Yes  SHORT TERM GOALS: Target date: 11/30/2022  Patient will be independent with initial HEP. Baseline: Goal status: met 5/22  2.  Patient will increase right shoulder flexion and abduction A/ROM to at least 100 degrees. Baseline:  Goal status: MET for flexion on 12/11/2022, IN PROGRESS for abduction (see above)   LONG TERM GOALS: Target date: 01/04/2023  Patient will be independent with advanced HEP. Baseline:  Goal status: IN PROGRESS  2.  Patient will increase shoulder FOTO to at least 61% to demonstrate improvements in functional mobility. Baseline:  Goal status: INITIAL  3.  Patient will increase shoulder A/ROM to [facility] without increased pain to allow her to place objects in overhead cabinets. Baseline:  Goal status: ongoing  4.  Patient will increase right shoulder strength to at least 4 to 4+/5 to allow her to perform functional tasks in home. Baseline: 3-/5 Goal status: ongoing  5.  Patient will improve 5 times sit to/from stand to 17 seconds or less to demonstrate improved functional strength. Baseline: 23.86 sec Goal status: INITIAL  6.  Patient will improve TUG to 14 seconds or less to place patient at a lower risk of falling. Baseline: 22.16 sec Goal status: INITIAL  PLAN: PT FREQUENCY: 2x/week  PT DURATION: 8 weeks  PLANNED INTERVENTIONS: Therapeutic exercises, Therapeutic activity, Neuromuscular re-education, Balance training, Gait training, Patient/Family education, Self Care, Joint mobilization, Joint manipulation, Stair training, Aquatic Therapy, Dry Needling, Electrical stimulation, Spinal manipulation, Spinal mobilization, Cryotherapy, Moist heat, Taping, Ultrasound, Ionotophoresis 4mg /ml Dexamethasone, Manual therapy, and Re-evaluation  PLAN FOR NEXT SESSION: Reassessment (as patient may opt for next visit to be her last), Assess and progress HEP as  indicated, gentle strength and flexibility, work on balance   Reather Laurence, PT 12/18/22 1:23 PM  [facility] [address] [place], [place] [address] Phone # [phone] Fax [phone]

## 2023-01-01 ENCOUNTER — Encounter: Payer: Self-pay | Admitting: Physical Therapy

## 2023-01-01 ENCOUNTER — Ambulatory Visit: Payer: Medicare Other | Admitting: Physical Therapy

## 2023-01-01 DIAGNOSIS — M6281 Muscle weakness (generalized): Secondary | ICD-10-CM

## 2023-01-01 DIAGNOSIS — M25511 Pain in right shoulder: Secondary | ICD-10-CM | POA: Diagnosis not present

## 2023-01-01 DIAGNOSIS — R2689 Other abnormalities of gait and mobility: Secondary | ICD-10-CM

## 2023-01-01 NOTE — Therapy (Signed)
OUTPATIENT PHYSICAL THERAPY TREATMENT NOTE   Patient Name: [name] MRN: [id number] DOB:[birth year], 77 y.o., female Today's Date: 01/01/2023  END OF SESSION:  PT End of Session - 01/01/23 1104     Visit Number 7    Date for PT Re-Evaluation 01/04/23    Authorization Type [employer]    Progress Note Due on Visit 10    PT Start Time 1104    PT Stop Time 1145    PT Time Calculation (min) 41 min    Activity Tolerance Patient tolerated treatment well    Behavior During Therapy WFL for tasks assessed/performed                 Past Medical History:  Diagnosis Date   Agatston coronary artery calcium score greater than 400    827 coronary calcium score which is 92nd percentile for age, race and sex matched controls   Anemia    Anxiety    Aortic atherosclerosis (HCC)    Arthritis    Backache, unspecified    Blood transfusion without reported diagnosis    Cataract    bil eyes   Depression    Hemiplegia, unspecified, affecting unspecified side    Lacunar infarction (HCC)    Other and unspecified hyperlipidemia    Polymyalgia (HCC)    Retinal defect, unspecified    tear of retina   Rotator cuff injury    right shoulder   Unspecified essential hypertension    Venous tributary (branch) occlusion of retina    Past Surgical History:  Procedure Laterality Date   ABDOMINAL HYSTERECTOMY     CHOLECYSTECTOMY     COLONOSCOPY  01/11/2010   per Dr. Carman Ching, internal hemorrhoids only, repeat in 10 yrs    SPHINCTEROTOMY     UPPER GASTROINTESTINAL ENDOSCOPY     Patient Active Problem List   Diagnosis Date Noted   Agatston coronary artery calcium score greater than 400 12/30/2021   Dysphagia 10/04/2021   Aortic atherosclerosis (HCC) 10/04/2021   Lesion of tonsil 03/15/2020   AKI (acute kidney injury) (HCC) 03/06/2020   CKD stage G2/A2, GFR 60-89 and albumin creatinine ratio 30-299 mg/g 03/06/2020   Hypomagnesemia 03/06/2020   Polymyalgia rheumatica (HCC)  03/06/2020   Hypercalcemia 03/04/2020   Confusion 03/02/2020   Memory loss 03/02/2020   B12 deficiency 01/09/2019   Vertigo 10/31/2018   Hypokalemia 10/31/2018   Chest pain 10/30/2018   Carpal tunnel syndrome, bilateral 08/11/2018   Hyperlipidemia 11/22/2009   Cerebral artery occlusion with cerebral infarction (HCC) 11/22/2009   BACK PAIN, CHRONIC 11/22/2009   UNSPECIFIED RETINAL DEFECT 11/08/2009   ROTATOR CUFF INJURY, RIGHT SHOULDER 11/08/2009   BRANCH RETINAL VEIN OCCLUSION 08/12/2007   Essential hypertension 08/12/2007    PCP: Nelwyn Salisbury, MD  REFERRING PROVIDER: London Sheer, MD  REFERRING DIAG: [phone] (ICD-10-CM) - Closed displaced fracture of greater tuberosity of right humerus with routine healing, subsequent encounter  THERAPY DIAG:  Right shoulder pain, unspecified chronicity  Muscle weakness (generalized)  Other abnormalities of gait and mobility  Rationale for Evaluation and Treatment: Rehabilitation  ONSET DATE: 09/10/2022  SUBJECTIVE:  SUBJECTIVE STATEMENT: I need today to be my last day due to my co-pay.   I do feel some improvement in pain and ROM but also may be hitting plateau in progress.   I am using the pole for ROM and the band and the wall walk.    Hand dominance: Right  PERTINENT HISTORY: Polymyalgia Rheumatica (PMR), hypertension, hyperlipidemia, previous CVA   PAIN:  Are you having pain? Yes: NPRS scale: 0-3/10 Pain location: right shoulder Pain description: cutting/pulling Aggravating factors: certain movements Relieving factors: rest/not moving  PRECAUTIONS: Fall Per Dr Willia Craze on 11/01/2022:  Okay to start active and passive range of motion. In 3 weeks, can start strengthening exercises.  WEIGHT BEARING RESTRICTIONS: No  FALLS:  Has  patient fallen in last 6 months? Yes. Number of falls 2  LIVING ENVIRONMENT: Lives with: lives with their spouse and lives with their daughter Lives in: House/apartment Stairs: Yes: External: 1 steps; none Has following equipment at home: Single point cane, Walker - 2 wheeled, and shower chair  OCCUPATION: Retired  PLOF: Independent and Leisure: drive with daughter in parks on Sunday, reading/Bible study  PATIENT GOALS: To improve my movement.  NEXT MD VISIT: March 09, 2023 with Dr Christell Constant  OBJECTIVE:   DIAGNOSTIC FINDINGS:  Right shoulder radiograph on 09/10/2022: IMPRESSION:  Acute minimally displaced fracture of the greater tuberosity of the humerus. Question extension of the fracture line across the anatomic neck of the humerus.   PATIENT SURVEYS :  01/01/23: FOTO 71% met goal Eval:  FOTO 50% (projected 61% by visit 11)  COGNITION: Overall cognitive status:  patient reports that sometimes she has problems with memory and can get confusion at times      POSTURE: Forward head, rounded shoulders  UPPER EXTREMITY ROM:   11/13/2022 (A/ROM): Right shoulder flexion:  90 degrees Right shoulder abduction:  82 degrees Left shoulder flexion:  115 degrees Left shoulder abduction: 105 degrees  12/11/2022 (A/ROM): Right shoulder flexion:  100 degrees Right shoulder abduction:  97 degrees  01/01/2023 (A/ROM): Right shoulder flexion:   110 degrees Right shoulder abduction:  95 degrees Right shoulder extension: 65  UPPER/LOWER EXTREMITY MMT:  11/13/2022: Right shoulder strength is 3-/5 Left shoulder strength is 3+/5 within available range BLE strength is grossly 4/5 throughout  01/01/23: Right shoulder: 4+/5 extension, 4/5 all other planes - no pain on testing   FUNCTIONAL ASSESSMENT:  01/01/23: 5x sit to stand: 17.46 TUG: 13.22  11/13/2022: 5 times sit to/from stand:  23.86 sec with UE use and decreased eccentric muscle control with stand to sit Timed Up and Go (TUG):   22.16 sec    TODAY'S TREATMENT:                                                                                                                                         DATE: 01/01/2023 5x sit to stand and TUG, shoulder  MMT and ROM, FOTO Supine chest press to overhead flexion with cane +1lb x20 Supine diagonal Rt UE reach with cane +1lb x20 Supine horiz abd yellow band 2x10 HEP standing band review: 1x10 each standing bil row, shoulder ext, ER    DATE: 12/18/2022 Shoulder pulley for flexion and abduction x3 min each Standing wall wash for shoulder flexion and abduction 2x10 each right UE Supine shoulder flexion with 1# on cane 2x10  Supine chest press with 1# on cane 2x10 Supine right shoulder AA/ROM with cane 2x10 Supine shoulder horizontal abduction and ER with yellow tband 2x10 bilateral Sit to/from stand holding 3# dumbbell 2x5 Seated right UE ranger for CW and CCW "big" circles x10 each direction Side stepping in parallel bars with occasional UE support x3 each direction   DATE: 12/11/2022 NuStep L4 x6' PT present to monitor and discuss status Shoulder pulley for flexion and abduction x2 min each Sit to stand chair + pad no UE use 2x5 Standing wall wash for shoulder flexion and abduction x10 each right UE Supine shoulder flexion with 1# x10 right UE Supine serratus punch with 1# x10 Right UE Supine shoulder horizontal abduction and ER with yellow tband 2x10 bilateral   DATE: 12/05/2022 NuStep L4 x6' PT present to monitor and discuss status Seated Rt ranger flexion, scaption and circles x10 each - ranger height to allow 90 deg at shoulder Sit to stand chair + pad no UE use 2x5 Rt finger ladder x5 reps - initial rep reached 23, final rep reached 25 Seated yellow tband 2x10 bil shoulder ER Seated Rt A/ROM shoulder flexion 2x5, PT manual facilitation of scapular mechanics Supine AA/ROM with dowel: flexion, scaption, ER/IR x 10 each Standing yellow tband bil row and shoulder ext  2x10 each Sideways walking in parallel bars x3 laps Bil heel raise with rail x 15 Alt standing march with rail x 20 Rt shoulder wall slides flexion and scaption x 10 each     PATIENT EDUCATION: Education details: Issued HEP Person educated: Patient Education method: Programmer, multimedia, Facilities manager, and Handouts Education comprehension: returned demonstration and needs further education  HOME EXERCISE PROGRAM: Access Code: [facility] URL: [email or web address] Date: 01/01/2023 Prepared by: Loistine Simas Adaiah Morken  Exercises - Supine Shoulder Flexion Extension AAROM with Dowel  - 1-2 x daily - 7 x weekly - 2 sets - 10 reps - Seated Scapular Retraction  - 1-2 x daily - 7 x weekly - 2 sets - 10 reps - Seated Shoulder Flexion Towel Slide at Table Top  - 1-2 x daily - 7 x weekly - 2 sets - 10 reps - Seated Shoulder Abduction Towel Slide at Table Top  - 1-2 x daily - 7 x weekly - 2 sets - 10 reps - Seated March  - 1-2 x daily - 7 x weekly - 2 sets - 10 reps - Seated Long Arc Quad  - 1-2 x daily - 7 x weekly - 2 sets - 10 reps - Standing Row with Anchored Resistance  - 1 x daily - 7 x weekly - 2 sets - 10 reps - Standing Shoulder Extension with Resistance  - 1 x daily - 7 x weekly - 2 sets - 10 reps - Standing Shoulder External Rotation with Resistance  - 1 x daily - 7 x weekly - 2 sets - 10 reps - Supine Shoulder Horizontal Abduction with Resistance  - 1 x daily - 7 x weekly - 2 sets - 10 reps - Shoulder Flexion Wall Slide with Towel  -  1 x daily - 7 x weekly - 2 sets - 10 reps - Shoulder Scaption Wall Slide with Towel  - 1 x daily - 7 x weekly - 2 sets - 10 reps  ASSESSMENT:  CLINICAL IMPRESSION: Ms Milbrath has made measurable improvements in Rt shoulder ROM and strength.  Additionally she has dropped a significant amount of time with 5x STS and TUG test, demo'ing reduced fall risk.  She is ind with HEP and should continue to make further gains in functional use with meaningful  incorporation of Rt dominant UE and compliance with HEP.  She requested to d/c today due to co-pay.     OBJECTIVE IMPAIRMENTS: decreased balance, difficulty walking, decreased ROM, decreased strength, postural dysfunction, and pain.   ACTIVITY LIMITATIONS: carrying, lifting, reach over head, and locomotion level  PARTICIPATION LIMITATIONS: cleaning and community activity  PERSONAL FACTORS: Age, Past/current experiences, Time since onset of injury/illness/exacerbation, and 3+ comorbidities: Polymyalgia Rheumatica, OA, Hx of CVA  are also affecting patient's functional outcome.   REHAB POTENTIAL: Good  CLINICAL DECISION MAKING: Evolving/moderate complexity  EVALUATION COMPLEXITY: Moderate  GOALS: Goals reviewed with patient? Yes  SHORT TERM GOALS: Target date: 11/30/2022  Patient will be independent with initial HEP. Baseline: Goal status: met 5/22  2.  Patient will increase right shoulder flexion and abduction A/ROM to at least 100 degrees. Baseline:  Goal status: MET for flexion on 12/11/2022, IN PROGRESS for abduction (see above)   LONG TERM GOALS: Target date: 01/04/2023  Patient will be independent with advanced HEP. Baseline:  Goal status: met 6/18  2.  Patient will increase shoulder FOTO to at least 61% to demonstrate improvements in functional mobility. Baseline:  Goal status: met 6/18  3.  Patient will increase shoulder A/ROM to [facility] without increased pain to allow her to place objects in overhead cabinets. Baseline:  Goal status: partially met at 110 deg reach 6/18  4.  Patient will increase right shoulder strength to at least 4 to 4+/5 to allow her to perform functional tasks in home. Baseline: 3-/5 Goal status: met 4/5 Rt shoulder 6/18  5.  Patient will improve 5 times sit to/from stand to 17 seconds or less to demonstrate improved functional strength. Baseline: 23.86 sec Goal status: met 6/18  6.  Patient will improve TUG to 14 seconds or less to place  patient at a lower risk of falling. Baseline: 22.16 sec Goal status: met 6/18  PLAN: PT FREQUENCY: 2x/week  PT DURATION: 8 weeks  PLANNED INTERVENTIONS: Therapeutic exercises, Therapeutic activity, Neuromuscular re-education, Balance training, Gait training, Patient/Family education, Self Care, Joint mobilization, Joint manipulation, Stair training, Aquatic Therapy, Dry Needling, Electrical stimulation, Spinal manipulation, Spinal mobilization, Cryotherapy, Moist heat, Taping, Ultrasound, Ionotophoresis 4mg /ml Dexamethasone, Manual therapy, and Re-evaluation  PLAN FOR NEXT SESSION: d/c to HEP   PHYSICAL THERAPY DISCHARGE SUMMARY  Visits from Start of Care: 7  Current functional level related to goals / functional outcomes: See above   Remaining deficits: See above   Education / Equipment: HEP   Patient agrees to discharge. Patient goals were met. Patient is being discharged due to financial reasons.  Morton Peters, PT 01/01/23 11:45 AM     [facility] [address] [place], [place] [address] Phone # [phone] Fax [phone]

## 2023-01-05 ENCOUNTER — Other Ambulatory Visit: Payer: Self-pay | Admitting: Family Medicine

## 2023-01-05 DIAGNOSIS — I10 Essential (primary) hypertension: Secondary | ICD-10-CM

## 2023-02-13 ENCOUNTER — Ambulatory Visit (INDEPENDENT_AMBULATORY_CARE_PROVIDER_SITE_OTHER): Payer: Medicare Other

## 2023-02-13 VITALS — BP 140/80 | HR 70 | Temp 98.0°F | Ht 61.5 in | Wt 142.9 lb

## 2023-02-13 DIAGNOSIS — Z0000 Encounter for general adult medical examination without abnormal findings: Secondary | ICD-10-CM
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
## 2023-02-13 NOTE — Patient Instructions (Addendum)
Ms. Avril , Thank you for taking time to come for your Medicare Wellness Visit. I appreciate your ongoing commitment to your health goals. Please review the following plan we discussed and let me know if I can assist you in the future.   Referrals/Orders/Follow-Ups/Clinician Recommendations:   This is a list of the screening recommended for you and due dates:  Health Maintenance  Topic Date Due   Hepatitis C Screening  Never done   COVID-19 Vaccine (4 - 2023-24 season) 08/16/2023*   Flu Shot  02/14/2023   Medicare Annual Wellness Visit  02/13/2024   Colon Cancer Screening  11/17/2028   Pneumonia Vaccine  Completed   DEXA scan (bone density measurement)  Completed   Zoster (Shingles) Vaccine  Completed   HPV Vaccine  Aged Out   DTaP/Tdap/Td vaccine  Discontinued  *Topic was postponed. The date shown is not the original due date.    Advanced directives: (Declined) Advance directive discussed with you today. Even though you declined this today, please call our office should you change your mind, and we can give you the proper paperwork for you to fill out.  Next Medicare Annual Wellness Visit scheduled for next year: Yes  Preventive Care 25 Years and Older, Female  Preventive care refers to lifestyle choices and visits with your health care provider that can promote health and wellness. What does preventive care include? A yearly physical exam. This is also called an annual well check. Dental exams once or twice a year. Routine eye exams. Ask your health care provider how often you should have your eyes checked. Personal lifestyle choices, including: Daily care of your teeth and gums. Regular physical activity. Eating a healthy diet. Avoiding tobacco and drug use. Limiting alcohol use. Practicing safe sex. Taking low doses of aspirin every day. Taking vitamin and mineral supplements as recommended by your health care provider. What happens during an annual well check? The  services and screenings done by your health care provider during your annual well check will depend on your age, overall health, lifestyle risk factors, and family history of disease. Counseling  Your health care provider may ask you questions about your: Alcohol use. Tobacco use. Drug use. Emotional well-being. Home and relationship well-being. Sexual activity. Eating habits. History of falls. Memory and ability to understand (cognition). Work and work Astronomer. Screening  You may have the following tests or measurements: Height, weight, and BMI. Blood pressure. Lipid and cholesterol levels. These may be checked every 5 years, or more frequently if you are over 51 years old. Skin check. Lung cancer screening. You may have this screening every year starting at age 19 if you have a 30-pack-year history of smoking and currently smoke or have quit within the past 15 years. Fecal occult blood test (FOBT) of the stool. You may have this test every year starting at age 45. Flexible sigmoidoscopy or colonoscopy. You may have a sigmoidoscopy every 5 years or a colonoscopy every 10 years starting at age 35. Prostate cancer screening. Recommendations will vary depending on your family history and other risks. Hepatitis C blood test. Hepatitis B blood test. Sexually transmitted disease (STD) testing. Diabetes screening. This is done by checking your blood sugar (glucose) after you have not eaten for a while (fasting). You may have this done every 1-3 years. Abdominal aortic aneurysm (AAA) screening. You may need this if you are a current or former smoker. Osteoporosis. You may be screened starting at age 35 if you are at high  risk. Talk with your health care provider about your test results, treatment options, and if necessary, the need for more tests. Vaccines  Your health care provider may recommend certain vaccines, such as: Influenza vaccine. This is recommended every year. Tetanus,  diphtheria, and acellular pertussis (Tdap, Td) vaccine. You may need a Td booster every 10 years. Zoster vaccine. You may need this after age 62. Pneumococcal 13-valent conjugate (PCV13) vaccine. One dose is recommended after age 69. Pneumococcal polysaccharide (PPSV23) vaccine. One dose is recommended after age 7. Talk to your health care provider about which screenings and vaccines you need and how often you need them. This information is not intended to replace advice given to you by your health care provider. Make sure you discuss any questions you have with your health care provider. Document Released: 07/29/2015 Document Revised: 03/21/2016 Document Reviewed: 05/03/2015 Elsevier Interactive Patient Education  2017 [employer].  Fall Prevention in the Home Falls can cause injuries. They can happen to people of all ages. There are many things you can do to make your home safe and to help prevent falls. What can I do on the outside of my home? Regularly fix the edges of walkways and driveways and fix any cracks. Remove anything that might make you trip as you walk through a door, such as a raised step or threshold. Trim any bushes or trees on the path to your home. Use bright outdoor lighting. Clear any walking paths of anything that might make someone trip, such as rocks or tools. Regularly check to see if handrails are loose or broken. Make sure that both sides of any steps have handrails. Any raised decks and porches should have guardrails on the edges. Have any leaves, snow, or ice cleared regularly. Use sand or salt on walking paths during winter. Clean up any spills in your garage right away. This includes oil or grease spills. What can I do in the bathroom? Use night lights. Install grab bars by the toilet and in the tub and shower. Do not use towel bars as grab bars. Use non-skid mats or decals in the tub or shower. If you need to sit down in the shower, use a plastic,  non-slip stool. Keep the floor dry. Clean up any water that spills on the floor as soon as it happens. Remove soap buildup in the tub or shower regularly. Attach bath mats securely with double-sided non-slip rug tape. Do not have throw rugs and other things on the floor that can make you trip. What can I do in the bedroom? Use night lights. Make sure that you have a light by your bed that is easy to reach. Do not use any sheets or blankets that are too big for your bed. They should not hang down onto the floor. Have a firm chair that has side arms. You can use this for support while you get dressed. Do not have throw rugs and other things on the floor that can make you trip. What can I do in the kitchen? Clean up any spills right away. Avoid walking on wet floors. Keep items that you use a lot in easy-to-reach places. If you need to reach something above you, use a strong step stool that has a grab bar. Keep electrical cords out of the way. Do not use floor polish or wax that makes floors slippery. If you must use wax, use non-skid floor wax. Do not have throw rugs and other things on the floor that can  make you trip. What can I do with my stairs? Do not leave any items on the stairs. Make sure that there are handrails on both sides of the stairs and use them. Fix handrails that are broken or loose. Make sure that handrails are as long as the stairways. Check any carpeting to make sure that it is firmly attached to the stairs. Fix any carpet that is loose or worn. Avoid having throw rugs at the top or bottom of the stairs. If you do have throw rugs, attach them to the floor with carpet tape. Make sure that you have a light switch at the top of the stairs and the bottom of the stairs. If you do not have them, ask someone to add them for you. What else can I do to help prevent falls? Wear shoes that: Do not have high heels. Have rubber bottoms. Are comfortable and fit you well. Are closed  at the toe. Do not wear sandals. If you use a stepladder: Make sure that it is fully opened. Do not climb a closed stepladder. Make sure that both sides of the stepladder are locked into place. Ask someone to hold it for you, if possible. Clearly mark and make sure that you can see: Any grab bars or handrails. First and last steps. Where the edge of each step is. Use tools that help you move around (mobility aids) if they are needed. These include: Canes. Walkers. Scooters. Crutches. Turn on the lights when you go into a dark area. Replace any light bulbs as soon as they burn out. Set up your furniture so you have a clear path. Avoid moving your furniture around. If any of your floors are uneven, fix them. If there are any pets around you, be aware of where they are. Review your medicines with your doctor. Some medicines can make you feel dizzy. This can increase your chance of falling. Ask your doctor what other things that you can do to help prevent falls. This information is not intended to replace advice given to you by your health care provider. Make sure you discuss any questions you have with your health care provider. Document Released: 04/28/2009 Document Revised: 12/08/2015 Document Reviewed: 08/06/2014 Elsevier Interactive Patient Education  2017 [employer].

## 2023-02-18 ENCOUNTER — Telehealth: Payer: Self-pay | Admitting: Family Medicine

## 2023-02-18 NOTE — Telephone Encounter (Signed)
Pt called to inform MD that she now has gout in her foot. Pt was offered an OV.  Pt declined, stating she has had this before, in her finger.  Pt is requesting Rx to treat or advice. Please return Pt's call at your earliest convenience.

## 2023-02-19 MED ORDER — METHYLPREDNISOLONE 4 MG PO TBPK: ORAL_TABLET | ORAL | 0 refills | Status: DC

## 2023-02-19 NOTE — Telephone Encounter (Signed)
Left detailed message on machine for patient letting her know that a refill has been sent.

## 2023-02-19 NOTE — Telephone Encounter (Signed)
I sent in a Medrol dose pack  

## 2023-03-14 ENCOUNTER — Other Ambulatory Visit (INDEPENDENT_AMBULATORY_CARE_PROVIDER_SITE_OTHER): Payer: Medicare Other

## 2023-03-14 ENCOUNTER — Ambulatory Visit (INDEPENDENT_AMBULATORY_CARE_PROVIDER_SITE_OTHER): Payer: Medicare Other | Admitting: Orthopedic Surgery

## 2023-03-14 DIAGNOSIS — M25511 Pain in right shoulder: Secondary | ICD-10-CM | POA: Diagnosis not present

## 2023-03-14 NOTE — Progress Notes (Signed)
Orthopedic Surgery Progress Note     Assessment: Patient is a 77 y.o. female with right greater tuberosity fracture Date of injury: 09/10/2022 (~6 months from date of injury)      Plan: -Operative plans: none -Weight bearing status: as tolerated -Continue with home exercise program -OTC medications for pain control -She has noticed decreased flexion of her right arm and has difficulty with overhead activities but is able to feed herself, wash her hair, brush her hair.  She is not having any pain in her right shoulder. We went over the risks and benefits of a potential surgery for her decreased range of motion.  After covering it, she felt that the benefits did not outweigh the risks.  Accordingly, we held off on getting an MRI -Told her that if her function declines or she is still having issues, the next step would be to get an MRI of the shoulder -Return to office on an as needed basis   I spent over going over her imaging, examining her, going over her current level of function, and discussing the risks/benefits or a potential surgery. We spent a lot of time going over why I think she is having limited forward elevation and weakness which I attributed to rotator cuff injury. We also spent significant discussing what complications can happen with surgery.   ___________________________________________________________________________   Subjective: Patient is a 77 year old female with past medical history of hypertension, hyperlipidemia, history of stroke who had a ground-level fall and sustained a right greater tuberosity fracture on 09/10/2022.  Patient is not having any pain in her shoulder at this point.  She is able to do most activities with the right arm. She says that she can feed herself, brush her hair, and wash her hair.  She has noticed difficulty getting stuff out of the cupboard with that arm.  No other functional limitations.  Denies paresthesias and numbness. Not taking any  medication for pain.     Physical Exam:   General: no acute distress, appears stated age Neurologic: alert, answering questions appropriately, following commands Respiratory: unlabored breathing on room air, symmetric chest rise Psychiatric: appropriate affect, normal cadence to speech   MSK:    -Right upper extremity             No tenderness to palpation over extremity, no gross deformity Fires deltoid, biceps, triceps, wrist extensors, wrist flexors, finger extensors, finger flexors             AIN/PIN/IO intact             Sensation intact to light touch in median/ulnar/radial/axillary nerve distributions             Active ROM shows FF to 90 degrees (passive to 120)             Hand warm and well perfused, palpable radial pulse    Imaging: XRs of the right shoulder from 03/14/2023 were independently reviewed and interpreted, showing  a displaced and comminuted greater tuberosity fracture. It appears in similar position to last films on 12/13/2022. No new fractures or dislocation seen. Decreased joint space and osteophyte seen at the inferior aspect of the proximal humerus.     Patient name: [name] Patient MRN: [id number] Date: 03/14/23

## 2023-04-01 ENCOUNTER — Ambulatory Visit (INDEPENDENT_AMBULATORY_CARE_PROVIDER_SITE_OTHER): Payer: Medicare Other | Admitting: Podiatry

## 2023-04-01 ENCOUNTER — Encounter: Payer: Self-pay | Admitting: Podiatry

## 2023-04-01 DIAGNOSIS — M79674 Pain in right toe(s): Secondary | ICD-10-CM

## 2023-04-01 DIAGNOSIS — B351 Tinea unguium: Secondary | ICD-10-CM | POA: Diagnosis not present

## 2023-04-01 DIAGNOSIS — M79675 Pain in left toe(s): Secondary | ICD-10-CM | POA: Diagnosis not present

## 2023-04-01 NOTE — Progress Notes (Signed)
   Chief Complaint  Patient presents with   Debridement    Trim toenails    SUBJECTIVE Patient presents to office today complaining of elongated, thickened nails that cause pain while ambulating in shoes.  Patient is unable to trim their own nails. Patient is here for further evaluation and treatment.  Past Medical History:  Diagnosis Date   Agatston coronary artery calcium score greater than 400    827 coronary calcium score which is 92nd percentile for age, race and sex matched controls   Anemia    Anxiety    Aortic atherosclerosis (HCC)    Arthritis    Backache, unspecified    Blood transfusion without reported diagnosis    Cataract    bil eyes   Depression    Hemiplegia, unspecified, affecting unspecified side    Lacunar infarction (HCC)    Other and unspecified hyperlipidemia    Polymyalgia (HCC)    Retinal defect, unspecified    tear of retina   Rotator cuff injury    right shoulder   Unspecified essential hypertension    Venous tributary (branch) occlusion of retina     Allergies  Allergen Reactions   Amoxicillin Nausea Only   Meloxicam Other (See Comments)    Muscle weakness      OBJECTIVE General Patient is awake, alert, and oriented x 3 and in no acute distress. Derm Skin is dry and supple bilateral. Negative open lesions or macerations. Remaining integument unremarkable. Nails are tender, long, thickened and dystrophic with subungual debris, consistent with onychomycosis, 1-5 bilateral. No signs of infection noted. Vasc  DP and PT pedal pulses palpable bilaterally. Temperature gradient within normal limits.  Neuro Epicritic and protective threshold sensation grossly intact bilaterally.  Musculoskeletal Exam No symptomatic pedal deformities noted bilateral. Muscular strength within normal limits.  ASSESSMENT 1.  Pain due to onychomycosis of toenails both  PLAN OF CARE 1. Patient evaluated today.  2. Instructed to maintain good pedal hygiene and foot  care.  3. Mechanical debridement of nails 1-5 bilaterally performed using a nail nipper. Filed with dremel without incident.  4. Return to clinic in 3 mos.    Felecia Shelling, DPM [facility]  Dr. Felecia Shelling, DPM    [address] [place], [place] [address]                Office [phone]  Fax [phone]

## 2023-04-05 ENCOUNTER — Other Ambulatory Visit: Payer: Self-pay | Admitting: Family Medicine

## 2023-04-05 DIAGNOSIS — I10 Essential (primary) hypertension: Secondary | ICD-10-CM

## 2023-06-01 ENCOUNTER — Other Ambulatory Visit: Payer: Self-pay

## 2023-06-01 ENCOUNTER — Other Ambulatory Visit: Payer: Self-pay | Admitting: Orthopedic Surgery

## 2023-06-01 ENCOUNTER — Encounter (HOSPITAL_COMMUNITY): Payer: Self-pay | Admitting: *Deleted

## 2023-06-01 ENCOUNTER — Encounter (HOSPITAL_COMMUNITY)
Admission: EM | Disposition: A | Discharge status: Skilled Nursing Facility | Payer: Self-pay | Source: Home / Self Care | Attending: Internal Medicine

## 2023-06-01 ENCOUNTER — Inpatient Hospital Stay (HOSPITAL_COMMUNITY): Payer: Medicare Other | Admitting: Anesthesiology

## 2023-06-01 ENCOUNTER — Emergency Department (HOSPITAL_COMMUNITY): Payer: Medicare Other

## 2023-06-01 ENCOUNTER — Inpatient Hospital Stay (HOSPITAL_COMMUNITY): Payer: Medicare Other

## 2023-06-01 ENCOUNTER — Inpatient Hospital Stay (HOSPITAL_COMMUNITY)
Admission: EM | Admit: 2023-06-01 | Discharge: 2023-06-04 | DRG: 481 | Disposition: A | Discharge status: Skilled Nursing Facility | Payer: Medicare Other | Attending: Internal Medicine | Admitting: Internal Medicine

## 2023-06-01 DIAGNOSIS — R5383 Other fatigue: Secondary | ICD-10-CM | POA: Diagnosis not present

## 2023-06-01 DIAGNOSIS — S0003XA Contusion of scalp, initial encounter: Secondary | ICD-10-CM | POA: Diagnosis present

## 2023-06-01 DIAGNOSIS — F32A Depression, unspecified: Secondary | ICD-10-CM | POA: Diagnosis present

## 2023-06-01 DIAGNOSIS — R339 Retention of urine, unspecified: Secondary | ICD-10-CM | POA: Diagnosis not present

## 2023-06-01 DIAGNOSIS — N182 Chronic kidney disease, stage 2 (mild): Secondary | ICD-10-CM | POA: Diagnosis present

## 2023-06-01 DIAGNOSIS — Z9049 Acquired absence of other specified parts of digestive tract: Secondary | ICD-10-CM | POA: Diagnosis not present

## 2023-06-01 DIAGNOSIS — S72141A Displaced intertrochanteric fracture of right femur, initial encounter for closed fracture: Secondary | ICD-10-CM

## 2023-06-01 DIAGNOSIS — Z83438 Family history of other disorder of lipoprotein metabolism and other lipidemia: Secondary | ICD-10-CM | POA: Diagnosis not present

## 2023-06-01 DIAGNOSIS — M353 Polymyalgia rheumatica: Secondary | ICD-10-CM | POA: Diagnosis present

## 2023-06-01 DIAGNOSIS — Y92000 Kitchen of unspecified non-institutional (private) residence as  the place of occurrence of the external cause: Secondary | ICD-10-CM

## 2023-06-01 DIAGNOSIS — Z8249 Family history of ischemic heart disease and other diseases of the circulatory system: Secondary | ICD-10-CM | POA: Diagnosis not present

## 2023-06-01 DIAGNOSIS — W010XXA Fall on same level from slipping, tripping and stumbling without subsequent striking against object, initial encounter: Secondary | ICD-10-CM | POA: Diagnosis present

## 2023-06-01 DIAGNOSIS — E876 Hypokalemia: Secondary | ICD-10-CM | POA: Diagnosis present

## 2023-06-01 DIAGNOSIS — E785 Hyperlipidemia, unspecified: Secondary | ICD-10-CM | POA: Diagnosis present

## 2023-06-01 DIAGNOSIS — I700 Atherosclerosis of aorta: Secondary | ICD-10-CM | POA: Diagnosis present

## 2023-06-01 DIAGNOSIS — Z8673 Personal history of transient ischemic attack (TIA), and cerebral infarction without residual deficits: Secondary | ICD-10-CM

## 2023-06-01 DIAGNOSIS — R9431 Abnormal electrocardiogram [ECG] [EKG]: Secondary | ICD-10-CM | POA: Diagnosis present

## 2023-06-01 DIAGNOSIS — Z90710 Acquired absence of both cervix and uterus: Secondary | ICD-10-CM

## 2023-06-01 DIAGNOSIS — M1990 Unspecified osteoarthritis, unspecified site: Secondary | ICD-10-CM | POA: Diagnosis present

## 2023-06-01 DIAGNOSIS — D696 Thrombocytopenia, unspecified: Secondary | ICD-10-CM | POA: Diagnosis not present

## 2023-06-01 DIAGNOSIS — I10 Essential (primary) hypertension: Secondary | ICD-10-CM | POA: Diagnosis present

## 2023-06-01 DIAGNOSIS — Z79899 Other long term (current) drug therapy: Secondary | ICD-10-CM

## 2023-06-01 DIAGNOSIS — R1310 Dysphagia, unspecified: Secondary | ICD-10-CM | POA: Diagnosis present

## 2023-06-01 DIAGNOSIS — Z881 Allergy status to other antibiotic agents status: Secondary | ICD-10-CM

## 2023-06-01 DIAGNOSIS — D62 Acute posthemorrhagic anemia: Secondary | ICD-10-CM | POA: Diagnosis not present

## 2023-06-01 DIAGNOSIS — W19XXXA Unspecified fall, initial encounter: Principal | ICD-10-CM

## 2023-06-01 DIAGNOSIS — I129 Hypertensive chronic kidney disease with stage 1 through stage 4 chronic kidney disease, or unspecified chronic kidney disease: Secondary | ICD-10-CM | POA: Diagnosis present

## 2023-06-01 DIAGNOSIS — Y9301 Activity, walking, marching and hiking: Secondary | ICD-10-CM | POA: Diagnosis present

## 2023-06-01 DIAGNOSIS — R338 Other retention of urine: Secondary | ICD-10-CM | POA: Diagnosis not present

## 2023-06-01 DIAGNOSIS — G8918 Other acute postprocedural pain: Secondary | ICD-10-CM | POA: Diagnosis not present

## 2023-06-01 DIAGNOSIS — Z880 Allergy status to penicillin: Secondary | ICD-10-CM

## 2023-06-01 DIAGNOSIS — M25551 Pain in right hip: Secondary | ICD-10-CM | POA: Diagnosis present

## 2023-06-01 DIAGNOSIS — F419 Anxiety disorder, unspecified: Secondary | ICD-10-CM | POA: Diagnosis present

## 2023-06-01 DIAGNOSIS — S72144D Nondisplaced intertrochanteric fracture of right femur, subsequent encounter for closed fracture with routine healing: Secondary | ICD-10-CM | POA: Diagnosis not present

## 2023-06-01 DIAGNOSIS — S0990XA Unspecified injury of head, initial encounter: Secondary | ICD-10-CM

## 2023-06-01 DIAGNOSIS — S72001A Fracture of unspecified part of neck of right femur, initial encounter for closed fracture: Secondary | ICD-10-CM

## 2023-06-01 DIAGNOSIS — R931 Abnormal findings on diagnostic imaging of heart and coronary circulation: Secondary | ICD-10-CM | POA: Diagnosis present

## 2023-06-01 HISTORY — PX: INTRAMEDULLARY (IM) NAIL INTERTROCHANTERIC: SHX5875

## 2023-06-01 LAB — COMPREHENSIVE METABOLIC PANEL
ALT: 14 U/L (ref 0–44)
AST: 26 U/L (ref 15–41)
Albumin: 3.7 g/dL (ref 3.5–5.0)
Alkaline Phosphatase: 61 U/L (ref 38–126)
Anion gap: 13 (ref 5–15)
BUN: 16 mg/dL (ref 8–23)
CO2: 24 mmol/L (ref 22–32)
Calcium: 9.4 mg/dL (ref 8.9–10.3)
Chloride: 103 mmol/L (ref 98–111)
Creatinine, Ser: 0.93 mg/dL (ref 0.44–1.00)
GFR, Estimated: >60 mL/min (ref >60)
Glucose, Bld: 134 mg/dL — ABNORMAL HIGH (ref 70–99)
Potassium: 3.1 mmol/L — ABNORMAL LOW (ref 3.5–5.1)
Sodium: 140 mmol/L (ref 135–145)
Total Bilirubin: 1.1 mg/dL (ref <1.2)
Total Protein: 7.4 g/dL (ref 6.5–8.1)

## 2023-06-01 LAB — CBC WITH DIFFERENTIAL/PLATELET
Abs Immature Granulocytes: 0.05 10*3/uL (ref 0.00–0.07)
Basophils Absolute: 0 10*3/uL (ref 0.0–0.1)
Basophils Relative: 0 %
Eosinophils Absolute: 0 10*3/uL (ref 0.0–0.5)
Eosinophils Relative: 0 %
HCT: 44.4 % (ref 36.0–46.0)
Hemoglobin: 14.4 g/dL (ref 12.0–15.0)
Immature Granulocytes: 1 %
Lymphocytes Relative: 11 %
Lymphs Abs: 1.2 10*3/uL (ref 0.7–4.0)
MCH: 28.4 pg (ref 26.0–34.0)
MCHC: 32.4 g/dL (ref 30.0–36.0)
MCV: 87.6 fL (ref 80.0–100.0)
Monocytes Absolute: 0.5 10*3/uL (ref 0.1–1.0)
Monocytes Relative: 5 %
Neutro Abs: 8.5 10*3/uL — ABNORMAL HIGH (ref 1.7–7.7)
Neutrophils Relative %: 83 %
Platelets: 181 10*3/uL (ref 150–400)
RBC: 5.07 MIL/uL (ref 3.87–5.11)
RDW: 14.6 % (ref 11.5–15.5)
WBC: 10.3 10*3/uL (ref 4.0–10.5)
nRBC: 0 % (ref 0.0–0.2)

## 2023-06-01 LAB — SURGICAL PCR SCREEN
MRSA, PCR: NEGATIVE
Staphylococcus aureus: POSITIVE — AB

## 2023-06-01 LAB — MAGNESIUM: Magnesium: 1.6 mg/dL — ABNORMAL LOW (ref 1.7–2.4)

## 2023-06-01 LAB — PROTIME-INR
INR: 1.1 (ref 0.8–1.2)
Prothrombin Time: 14.3 s (ref 11.4–15.2)

## 2023-06-01 LAB — VITAMIN D 25 HYDROXY (VIT D DEFICIENCY, FRACTURES): Vit D, 25-Hydroxy: 18.48 ng/mL — ABNORMAL LOW (ref 30–100)

## 2023-06-01 SURGERY — FIXATION, FRACTURE, INTERTROCHANTERIC, WITH INTRAMEDULLARY ROD
Anesthesia: General | Laterality: Right

## 2023-06-01 MED ORDER — LABETALOL HCL 5 MG/ML IV SOLN: 5.0000 mg | Freq: Three times a day (TID) | INTRAVENOUS | Status: DC | PRN

## 2023-06-01 MED ORDER — DOCUSATE SODIUM 100 MG PO CAPS
100.0000 mg | ORAL_CAPSULE | Freq: Two times a day (BID) | ORAL | Status: DC
Start: 2023-06-01 — End: 2023-06-04
  Administered 2023-06-01 – 2023-06-04 (×6): 100 mg via ORAL
  Filled 2023-06-01 (×6): qty 1

## 2023-06-01 MED ORDER — CLONIDINE HCL (ANALGESIA) 100 MCG/ML EP SOLN
EPIDURAL | Status: AC
  Filled 2023-06-01: qty 10

## 2023-06-01 MED ORDER — ACETAMINOPHEN 500 MG PO TABS
1000.0000 mg | ORAL_TABLET | Freq: Four times a day (QID) | ORAL | Status: AC
  Administered 2023-06-02 – 2023-06-03 (×4): 1000 mg via ORAL
  Filled 2023-06-01 (×4): qty 2

## 2023-06-01 MED ORDER — CEFAZOLIN SODIUM-DEXTROSE 2-4 GM/100ML-% IV SOLN
2.0000 g | INTRAVENOUS | Status: AC
  Administered 2023-06-01: 2 g via INTRAVENOUS

## 2023-06-01 MED ORDER — CEFAZOLIN SODIUM-DEXTROSE 2-4 GM/100ML-% IV SOLN
INTRAVENOUS | Status: AC
  Filled 2023-06-01: qty 100

## 2023-06-01 MED ORDER — 0.9 % SODIUM CHLORIDE (POUR BTL) OPTIME
TOPICAL | Status: DC | PRN
Start: 2023-06-01 — End: 2023-06-01
  Administered 2023-06-01: 2000 mL

## 2023-06-01 MED ORDER — ORAL CARE MOUTH RINSE: 15.0000 mL | Freq: Once | OROMUCOSAL | Status: AC

## 2023-06-01 MED ORDER — MORPHINE SULFATE (PF) 4 MG/ML IV SOLN
4.0000 mg | Freq: Once | INTRAVENOUS | Status: AC
  Administered 2023-06-01: 4 mg via INTRAVENOUS
  Filled 2023-06-01: qty 1

## 2023-06-01 MED ORDER — FENTANYL CITRATE (PF) 250 MCG/5ML IJ SOLN
INTRAMUSCULAR | Status: DC | PRN
  Administered 2023-06-01: 50 ug via INTRAVENOUS
  Administered 2023-06-01: 100 ug via INTRAVENOUS

## 2023-06-01 MED ORDER — CHLORHEXIDINE GLUCONATE 4 % EX SOLN
60.0000 mL | Freq: Once | CUTANEOUS | Status: AC
  Administered 2023-06-01: 4 via TOPICAL

## 2023-06-01 MED ORDER — OXYCODONE HCL 5 MG PO TABS
5.0000 mg | ORAL_TABLET | Freq: Four times a day (QID) | ORAL | Status: DC | PRN
Start: 2023-06-01 — End: 2023-06-04
  Administered 2023-06-02 – 2023-06-03 (×2): 5 mg via ORAL
  Filled 2023-06-01 (×2): qty 1

## 2023-06-01 MED ORDER — TRANEXAMIC ACID-NACL 1000-0.7 MG/100ML-% IV SOLN
1000.0000 mg | Freq: Once | INTRAVENOUS | Status: AC
  Administered 2023-06-01: 1000 mg via INTRAVENOUS
  Filled 2023-06-01: qty 100

## 2023-06-01 MED ORDER — AMISULPRIDE (ANTIEMETIC) 5 MG/2ML IV SOLN: 10.0000 mg | Freq: Once | INTRAVENOUS | Status: DC | PRN

## 2023-06-01 MED ORDER — MUPIROCIN 2 % EX OINT
1.0000 | TOPICAL_OINTMENT | Freq: Two times a day (BID) | CUTANEOUS | Status: DC
  Administered 2023-06-02 – 2023-06-04 (×6): 1 via NASAL
  Filled 2023-06-01 (×3): qty 22

## 2023-06-01 MED ORDER — FENTANYL CITRATE (PF) 100 MCG/2ML IJ SOLN: 25.0000 ug | INTRAMUSCULAR | Status: DC | PRN

## 2023-06-01 MED ORDER — CEFAZOLIN SODIUM-DEXTROSE 2-4 GM/100ML-% IV SOLN
2.0000 g | Freq: Three times a day (TID) | INTRAVENOUS | Status: AC
  Administered 2023-06-01 – 2023-06-02 (×2): 2 g via INTRAVENOUS
  Filled 2023-06-01 (×2): qty 100

## 2023-06-01 MED ORDER — HYDROMORPHONE HCL 1 MG/ML IJ SOLN
0.5000 mg | INTRAMUSCULAR | Status: DC | PRN
  Administered 2023-06-02: 0.5 mg via INTRAVENOUS
  Filled 2023-06-01 (×2): qty 0.5

## 2023-06-01 MED ORDER — POTASSIUM CHLORIDE 10 MEQ/100ML IV SOLN: 10.0000 meq | Freq: Once | INTRAVENOUS | Status: DC

## 2023-06-01 MED ORDER — SUGAMMADEX SODIUM 200 MG/2ML IV SOLN
INTRAVENOUS | Status: DC | PRN
  Administered 2023-06-01: 120 mg via INTRAVENOUS

## 2023-06-01 MED ORDER — PHENOL 1.4 % MT LIQD: 1.0000 | OROMUCOSAL | Status: DC | PRN

## 2023-06-01 MED ORDER — PHENYLEPHRINE 80 MCG/ML (10ML) SYRINGE FOR IV PUSH (FOR BLOOD PRESSURE SUPPORT)
PREFILLED_SYRINGE | INTRAVENOUS | Status: AC
  Filled 2023-06-01: qty 30

## 2023-06-01 MED ORDER — VITAMIN D (ERGOCALCIFEROL) 1.25 MG (50000 UNIT) PO CAPS
50000.0000 [IU] | ORAL_CAPSULE | ORAL | Status: DC
  Administered 2023-06-01: 50000 [IU] via ORAL
  Filled 2023-06-01: qty 1

## 2023-06-01 MED ORDER — ROCURONIUM BROMIDE 10 MG/ML (PF) SYRINGE
PREFILLED_SYRINGE | INTRAVENOUS | Status: DC | PRN
  Administered 2023-06-01: 30 mg via INTRAVENOUS
  Administered 2023-06-01: 20 mg via INTRAVENOUS

## 2023-06-01 MED ORDER — ENALAPRIL MALEATE 10 MG PO TABS
20.0000 mg | ORAL_TABLET | Freq: Every day | ORAL | Status: DC
  Administered 2023-06-02 – 2023-06-04 (×3): 20 mg via ORAL
  Filled 2023-06-01 (×4): qty 2

## 2023-06-01 MED ORDER — PHENYLEPHRINE 80 MCG/ML (10ML) SYRINGE FOR IV PUSH (FOR BLOOD PRESSURE SUPPORT)
PREFILLED_SYRINGE | INTRAVENOUS | Status: DC | PRN
  Administered 2023-06-01 (×3): 160 ug via INTRAVENOUS
  Administered 2023-06-01: 80 ug via INTRAVENOUS
  Administered 2023-06-01 (×2): 160 ug via INTRAVENOUS
  Administered 2023-06-01 (×2): 80 ug via INTRAVENOUS
  Administered 2023-06-01: 160 ug via INTRAVENOUS
  Administered 2023-06-01 (×2): 80 ug via INTRAVENOUS

## 2023-06-01 MED ORDER — MORPHINE SULFATE (PF) 4 MG/ML IV SOLN
INTRAVENOUS | Status: AC
  Filled 2023-06-01: qty 2

## 2023-06-01 MED ORDER — LABETALOL HCL 5 MG/ML IV SOLN
5.0000 mg | Freq: Once | INTRAVENOUS | Status: AC
  Administered 2023-06-01: 5 mg via INTRAVENOUS

## 2023-06-01 MED ORDER — TRANEXAMIC ACID-NACL 1000-0.7 MG/100ML-% IV SOLN
INTRAVENOUS | Status: AC
  Filled 2023-06-01: qty 100

## 2023-06-01 MED ORDER — HYDROCODONE-ACETAMINOPHEN 5-325 MG PO TABS
1.0000 | ORAL_TABLET | Freq: Four times a day (QID) | ORAL | Status: DC | PRN
Start: 2023-06-01 — End: 2023-06-01

## 2023-06-01 MED ORDER — LACTATED RINGERS IV SOLN: INTRAVENOUS | Status: DC

## 2023-06-01 MED ORDER — MORPHINE SULFATE (PF) 2 MG/ML IV SOLN: 0.5000 mg | INTRAVENOUS | Status: DC | PRN

## 2023-06-01 MED ORDER — METOCLOPRAMIDE HCL 5 MG PO TABS
5.0000 mg | ORAL_TABLET | Freq: Three times a day (TID) | ORAL | Status: DC | PRN
Start: 2023-06-01 — End: 2023-06-02

## 2023-06-01 MED ORDER — TRANEXAMIC ACID-NACL 1000-0.7 MG/100ML-% IV SOLN
1000.0000 mg | INTRAVENOUS | Status: AC
  Administered 2023-06-01: 1000 mg via INTRAVENOUS

## 2023-06-01 MED ORDER — PROPOFOL 10 MG/ML IV BOLUS
INTRAVENOUS | Status: AC
  Filled 2023-06-01: qty 20

## 2023-06-01 MED ORDER — PROPOFOL 10 MG/ML IV BOLUS
INTRAVENOUS | Status: DC | PRN
  Administered 2023-06-01: 50 mg via INTRAVENOUS

## 2023-06-01 MED ORDER — DEXAMETHASONE SODIUM PHOSPHATE 10 MG/ML IJ SOLN
INTRAMUSCULAR | Status: DC | PRN
  Administered 2023-06-01: 10 mg via INTRAVENOUS

## 2023-06-01 MED ORDER — ALBUMIN HUMAN 5 % IV SOLN: INTRAVENOUS | Status: DC | PRN

## 2023-06-01 MED ORDER — METHOCARBAMOL 500 MG PO TABS
500.0000 mg | ORAL_TABLET | Freq: Four times a day (QID) | ORAL | Status: DC | PRN
  Administered 2023-06-02 – 2023-06-03 (×4): 500 mg via ORAL
  Filled 2023-06-01 (×4): qty 1

## 2023-06-01 MED ORDER — FENTANYL CITRATE (PF) 250 MCG/5ML IJ SOLN
INTRAMUSCULAR | Status: AC
  Filled 2023-06-01: qty 5

## 2023-06-01 MED ORDER — ONDANSETRON HCL 4 MG/2ML IJ SOLN
4.0000 mg | Freq: Once | INTRAMUSCULAR | Status: DC | PRN
Start: 2023-06-01 — End: 2023-06-01

## 2023-06-01 MED ORDER — DEXTROSE-SODIUM CHLORIDE 5-0.9 % IV SOLN
INTRAVENOUS | Status: DC
Start: 2023-06-01 — End: 2023-06-02

## 2023-06-01 MED ORDER — METHOCARBAMOL 1000 MG/10ML IJ SOLN: 500.0000 mg | Freq: Four times a day (QID) | INTRAMUSCULAR | Status: DC | PRN

## 2023-06-01 MED ORDER — CHLORHEXIDINE GLUCONATE 0.12 % MT SOLN
15.0000 mL | Freq: Once | OROMUCOSAL | Status: AC
  Administered 2023-06-01: 15 mL via OROMUCOSAL

## 2023-06-01 MED ORDER — ONDANSETRON HCL 4 MG/2ML IJ SOLN
INTRAMUSCULAR | Status: DC | PRN
  Administered 2023-06-01: 4 mg via INTRAVENOUS

## 2023-06-01 MED ORDER — ACETAMINOPHEN 325 MG PO TABS
325.0000 mg | ORAL_TABLET | Freq: Four times a day (QID) | ORAL | Status: DC | PRN
  Administered 2023-06-02: 650 mg via ORAL
  Filled 2023-06-01: qty 2

## 2023-06-01 MED ORDER — MORPHINE SULFATE (PF) 4 MG/ML IV SOLN: INTRAVENOUS | Status: DC | PRN

## 2023-06-01 MED ORDER — LIDOCAINE 2% (20 MG/ML) 5 ML SYRINGE
INTRAMUSCULAR | Status: DC | PRN
  Administered 2023-06-01: 60 mg via INTRAVENOUS

## 2023-06-01 MED ORDER — BUPIVACAINE HCL (PF) 0.5 % IJ SOLN
INTRAMUSCULAR | Status: AC
  Filled 2023-06-01: qty 30

## 2023-06-01 MED ORDER — POVIDONE-IODINE 10 % EX SWAB
2.0000 | Freq: Once | CUTANEOUS | Status: AC
  Administered 2023-06-01: 2 via TOPICAL

## 2023-06-01 MED ORDER — ASPIRIN 81 MG PO CHEW
81.0000 mg | CHEWABLE_TABLET | Freq: Two times a day (BID) | ORAL | Status: DC
  Administered 2023-06-01 – 2023-06-04 (×6): 81 mg via ORAL
  Filled 2023-06-01 (×6): qty 1

## 2023-06-01 MED ORDER — CHLORHEXIDINE GLUCONATE CLOTH 2 % EX PADS
6.0000 | MEDICATED_PAD | Freq: Every day | CUTANEOUS | Status: DC
  Administered 2023-06-02 – 2023-06-04 (×3): 6 via TOPICAL

## 2023-06-01 MED ORDER — MENTHOL 3 MG MT LOZG: 1.0000 | LOZENGE | OROMUCOSAL | Status: DC | PRN

## 2023-06-01 MED ORDER — METOCLOPRAMIDE HCL 5 MG/ML IJ SOLN: 5.0000 mg | Freq: Three times a day (TID) | INTRAMUSCULAR | Status: DC | PRN

## 2023-06-01 MED ORDER — ACETAMINOPHEN 10 MG/ML IV SOLN
1000.0000 mg | Freq: Once | INTRAVENOUS | Status: DC | PRN
Start: 2023-06-01 — End: 2023-06-01

## 2023-06-01 MED ORDER — POTASSIUM CHLORIDE 10 MEQ/100ML IV SOLN
10.0000 meq | Freq: Once | INTRAVENOUS | Status: AC
  Administered 2023-06-01: 10 meq via INTRAVENOUS
  Filled 2023-06-01: qty 100

## 2023-06-01 MED ORDER — HYDROCHLOROTHIAZIDE 25 MG PO TABS: 25.0000 mg | ORAL_TABLET | ORAL | Status: DC

## 2023-06-01 SURGICAL SUPPLY — 58 items
BAG COUNTER SPONGE SURGICOUNT (BAG) ×1 IMPLANT
BIT DRILL INTERTAN LAG SCREW (BIT) IMPLANT
BIT DRILL SHORT 4.0 (BIT) IMPLANT
BLADE SURG 15 STRL LF DISP TIS (BLADE) ×1 IMPLANT
BLADE SURG 15 STRL SS (BLADE) ×1
CLSR STERI-STRIP ANTIMIC 1/2X4 (GAUZE/BANDAGES/DRESSINGS) IMPLANT
COVER PERINEAL POST (MISCELLANEOUS) ×1 IMPLANT
COVER SURGICAL LIGHT HANDLE (MISCELLANEOUS) ×1 IMPLANT
DRAPE INCISE IOBAN 66X45 STRL (DRAPES) IMPLANT
DRAPE STERI IOBAN 125X83 (DRAPES) ×1 IMPLANT
DRAPE SURG ORHT 6 SPLT 77X108 (DRAPES) IMPLANT
DRESSING MEPILEX FLEX 4X4 (GAUZE/BANDAGES/DRESSINGS) IMPLANT
DRILL BIT SHORT 4.0 (BIT) ×1
DRSG AQUACEL AG ADV 3.5X 6 (GAUZE/BANDAGES/DRESSINGS) IMPLANT
DRSG MEPILEX FLEX 4X4 (GAUZE/BANDAGES/DRESSINGS) IMPLANT
DRSG MEPILEX POST OP 4X8 (GAUZE/BANDAGES/DRESSINGS) ×1 IMPLANT
DURAPREP 26ML APPLICATOR (WOUND CARE) ×1 IMPLANT
ELECT REM PT RETURN 9FT ADLT (ELECTROSURGICAL) ×1 IMPLANT
ELECTRODE REM PT RTRN 9FT ADLT (ELECTROSURGICAL) ×1 IMPLANT
FACESHIELD WRAPAROUND (MASK) ×1 IMPLANT
FACESHIELD WRAPAROUND OR TEAM (MASK) ×1 IMPLANT
GAUZE XEROFORM 5X9 LF (GAUZE/BANDAGES/DRESSINGS) ×1 IMPLANT
GLOVE BIO SURGEON STRL SZ 6.5 (GLOVE) IMPLANT
GLOVE BIOGEL PI IND STRL 6.5 (GLOVE) IMPLANT
GLOVE BIOGEL PI IND STRL 8 (GLOVE) ×1 IMPLANT
GLOVE ECLIPSE 8.0 STRL XLNG CF (GLOVE) ×1 IMPLANT
GOWN STRL REUS W/ TWL LRG LVL3 (GOWN DISPOSABLE) ×1 IMPLANT
GOWN STRL REUS W/ TWL XL LVL3 (GOWN DISPOSABLE) IMPLANT
GOWN STRL REUS W/TWL LRG LVL3 (GOWN DISPOSABLE) ×1
GOWN STRL REUS W/TWL XL LVL3 (GOWN DISPOSABLE) ×2
GUIDE PIN 3.2X343 (PIN) ×2 IMPLANT
GUIDE PIN 3.2X343MM (PIN) ×2
GUIDE ROD 3.0 (MISCELLANEOUS) ×1 IMPLANT
KIT BASIN OR (CUSTOM PROCEDURE TRAY) ×1 IMPLANT
KIT TURNOVER KIT B (KITS) ×1 IMPLANT
NAIL TRIGEN 10MMX36CM-125 RT (Nail) IMPLANT
NDL HYPO 22X1.5 SAFETY MO (MISCELLANEOUS) IMPLANT
NEEDLE HYPO 22X1.5 SAFETY MO (MISCELLANEOUS) ×1 IMPLANT
NS IRRIG 1000ML POUR BTL (IV SOLUTION) ×1 IMPLANT
PACK GENERAL/GYN (CUSTOM PROCEDURE TRAY) ×1 IMPLANT
PAD ARMBOARD 7.5X6 YLW CONV (MISCELLANEOUS) ×2 IMPLANT
PIN GUIDE 3.2X343MM (PIN) IMPLANT
ROD GUIDE 3.0 (MISCELLANEOUS) IMPLANT
SCREW LAG COMPR KIT 80/75 (Screw) IMPLANT
STAPLER VISISTAT 35W (STAPLE) ×1 IMPLANT
SUT ETHILON 3 0 PS 1 (SUTURE) IMPLANT
SUT MNCRL AB 3-0 PS2 18 (SUTURE) IMPLANT
SUT VIC AB 0 CT1 27 (SUTURE) ×2
SUT VIC AB 0 CT1 27XBRD ANBCTR (SUTURE) IMPLANT
SUT VIC AB 2-0 CT1 27 (SUTURE) ×2
SUT VIC AB 2-0 CT1 TAPERPNT 27 (SUTURE) IMPLANT
SUT VIC AB 2-0 CTB1 (SUTURE) ×1 IMPLANT
SUT VIC AB CT1 27XBRD ANBCTRL (SUTURE) ×2
SYR 30ML SLIP (SYRINGE) IMPLANT
TAPE STRIPS DRAPE STRL (GAUZE/BANDAGES/DRESSINGS) IMPLANT
TOWEL GREEN STERILE (TOWEL DISPOSABLE) ×1 IMPLANT
TOWEL GREEN STERILE FF (TOWEL DISPOSABLE) ×1 IMPLANT
WATER STERILE IRR 1000ML POUR (IV SOLUTION) ×1 IMPLANT

## 2023-06-01 NOTE — Progress Notes (Signed)
Received patient from OR at 1935; it was reported to me that they attempted an in and out cath while in the OR, was unsuccessful and attempt stopped when area became red. While in PACU, patient reported discomfort from the need to urinate, declined bed pan so Purewick was put in place. Patient unable to void and continued to report discomfort. I explained to patient that this could be from the attempt to in and out catheterize, the irritation causing spasms. When patient continued to report discomfort, I performed a bladder scan and found . I informed Dr. Bradley Ferris, who gave verbal order for another in and out attempt in PACU. We started to get the supplies ready, but patient decided she did not want the in an out and instead wanted to try and void herself. After a few minutes of relaxation techniques and patient performing massage there was some urine noted in the suction tubing. I reported patient declining in and out cath and the presence of some urine to Dr. Bradley Ferris, who said patient could go to 5N. I relayed this information to the floor RN. Patient had also reported that she has been having some difficulty swallowing and a burning chest sensation after drinking while at home. She said she told Dr. [name] about this. Patient tolerated lemon-lime drink while in PACU, no coughing or signs of distress. I also relayed this to the floor RN.

## 2023-06-01 NOTE — Assessment & Plan Note (Signed)
Followed by rheumatology, but on no medication  Uses walker to ambulate

## 2023-06-01 NOTE — ED Triage Notes (Signed)
Patient presents to ed via [facility] from home states she was standing in her kitchen loss her balance and fell hit the right side of her head on a chair, hematoma to right side of head. C/o severe pain in her right thigh, not on thinners , no shortening or rotation. Bilateral pedal pulses present. Denies neck pain. Patient is alert oriented , jeans were cut off to painful to slid them off.

## 2023-06-01 NOTE — Plan of Care (Signed)
  Problem: Activity: Goal: Risk for activity intolerance will decrease Outcome: Progressing   Problem: Coping: Goal: Level of anxiety will decrease Outcome: Progressing   Problem: Skin Integrity: Goal: Risk for impaired skin integrity will decrease Outcome: Progressing   

## 2023-06-01 NOTE — Anesthesia Preprocedure Evaluation (Addendum)
Anesthesia Evaluation  Patient identified by MRN, date of birth, ID band Patient awake    Reviewed: Allergy & Precautions, NPO status , Patient's Chart, lab work & pertinent test results  Airway Mallampati: II  TM Distance: >3 FB Neck ROM: Full    Dental no notable dental hx.    Pulmonary neg pulmonary ROS   Pulmonary exam normal        Cardiovascular hypertension, Pt. on medications Normal cardiovascular exam     Neuro/Psych  PSYCHIATRIC DISORDERS Anxiety Depression     Neuromuscular disease    GI/Hepatic negative GI ROS, Neg liver ROS,,,  Endo/Other  negative endocrine ROS    Renal/GU Renal disease     Musculoskeletal  (+) Arthritis ,  Polymyalgia rheumatica    Abdominal   Peds  Hematology negative hematology ROS (+)   Anesthesia Other Findings right intertrochanteric fracture  Reproductive/Obstetrics                             Anesthesia Physical Anesthesia Plan  ASA: 2  Anesthesia Plan: General   Post-op Pain Management:    Induction: Intravenous  PONV Risk Score and Plan: 3 and Ondansetron, Dexamethasone and Treatment may vary due to age or medical condition  Airway Management Planned: Oral ETT  Additional Equipment:   Intra-op Plan:   Post-operative Plan: Extubation in OR  Informed Consent: I have reviewed the patients History and Physical, chart, labs and discussed the procedure including the risks, benefits and alternatives for the proposed anesthesia with the patient or authorized representative who has indicated his/her understanding and acceptance.     Dental advisory given  Plan Discussed with: CRNA  Anesthesia Plan Comments:        Anesthesia Quick Evaluation

## 2023-06-01 NOTE — Assessment & Plan Note (Signed)
Elevated coronary calcium score at 827 which is 92nd percentile for age, race and sex matched controls.  Patient has been referred to lipid clinic as patient was supposed to start Crestor but did not want to.  Was supposed to start aspirin 81 mg daily, but has not done this.

## 2023-06-01 NOTE — Anesthesia Procedure Notes (Signed)
Procedure Name: Intubation Date/Time: 06/01/2023 5:38 PM  Performed by: Alease Medina, CRNAPre-anesthesia Checklist: Patient identified, Emergency Drugs available, Suction available and Patient being monitored Patient Re-evaluated:Patient Re-evaluated prior to induction Oxygen Delivery Method: Circle system utilized Preoxygenation: Pre-oxygenation with 100% oxygen Induction Type: IV induction Ventilation: Mask ventilation without difficulty Laryngoscope Size: Mac and 4 Grade View: Grade I Tube type: Oral Number of attempts: 1 Airway Equipment and Method: Stylet and Oral airway Placement Confirmation: ETT inserted through vocal cords under direct vision, positive ETCO2 and breath sounds checked- equal and bilateral Secured at: 22 cm Tube secured with: Tape Dental Injury: Teeth and Oropharynx as per pre-operative assessment

## 2023-06-01 NOTE — ED Notes (Signed)
Transported to xray 

## 2023-06-01 NOTE — Progress Notes (Signed)
Orthopedic Tech Progress Note Patient Details:  [name] 02/11/1946 [id number]  Patient ID: [name], female   DOB: 1945/11/04, 77 y.o.   MRN: [id number] Pt doesn't meet criteria for ohf. Pt must be under 70 to get ohf. [name] 06/01/2023, 8:32 PM

## 2023-06-01 NOTE — Progress Notes (Signed)
Ambulatory 77 year old patient with right intertrochanteric hip fracture Plan right hip fracture fixation tonight Full consult to follow

## 2023-06-01 NOTE — Brief Op Note (Signed)
   06/01/2023  9:01 PM  PATIENT:  [name]  77 y.o. female  PRE-OPERATIVE DIAGNOSIS:  right intertrochanteric fracture  POST-OPERATIVE DIAGNOSIS:  right intertrochanteric fracture  PROCEDURE:  Procedure(s): INTRAMEDULLARY (IM) NAIL INTERTROCHANTERIC  SURGEON:  Surgeon(s): [name], Corrie Mckusick, MD  ASSISTANT: magnant pa  ANESTHESIA:   general  EBL: 100 ml    Total I/O In: 1250 [I.V.:1000; IV Piggyback:250] Out: -   BLOOD ADMINISTERED: none  DRAINS: none   LOCAL MEDICATIONS USED:  none  SPECIMEN:  No Specimen  COUNTS:  YES  TOURNIQUET:  * No tourniquets in log *  DICTATION: .Other Dictation: Dictation Number pending  PLAN OF CARE: Admit to inpatient   PATIENT DISPOSITION:  PACU - hemodynamically stable

## 2023-06-01 NOTE — Assessment & Plan Note (Signed)
Elevated, but likely due to pain Oral meds on hold while NPO Labetalol PRN

## 2023-06-01 NOTE — ED Notes (Signed)
Patient returned from xray.

## 2023-06-01 NOTE — H&P (Signed)
History and Physical    Patient: [name] WGN:[id number] DOB: [birth year] DOA: 06/01/2023 DOS: the patient was seen and examined on 06/01/2023 PCP: Nelwyn Salisbury, MD  Patient coming from: Home - lives with her husband and daughter. Uses walker to ambulate    Chief Complaint: fall  HPI: [name] is a 77 y.o. female with medical history significant of hx of CVA, HLD, PMR, HTN who presented to ED after a fall. She was in her kitchen and lost her balance and fell hard onto her right hip. She states this morning she was feeling good and walking faster than normal. She didn't have her walker. She was walking from the den into the kitchen and the floor grabbed her foot and she fell onto her right side and hit her head on the chair. She had no prodromal symptoms. Denies any blood thinners or aspirin.    Denies any fever/chills, vision changes/headaches, chest pain or palpitations, shortness of breath or cough, abdominal pain, N/V/D, dysuria or leg swelling.    She does not smoke or drink alcohol.   ER Course:  vitals: afebrile, bp: 193/75, HR: 102, RR: 17, oxygen: 97%RA Pertinent labs: potassium: 3.1,  CXR: no acute finding Right femur xray: acute, comminuted intertrochanteric fx of the proximal right femur with 90 degrees of varus angulation.  CT  head: no acute finding. Small right parietal scalp hematoma  CT cervical spine: no acute findings  CT pelvis: Comminuted right intertrochanteric femur fracture with varus angulation. 2. Asymmetric expansion of the musculature surrounding the right greater trochanter with increased surrounding soft tissue density, likely hematoma. In ED: ortho consulted (Dr. [name]) given potassium and TRH asked to admit.    Review of Systems: As mentioned in the history of present illness. All other systems reviewed and are negative. Past Medical History:  Diagnosis Date   Agatston coronary artery calcium score greater than 400    827 coronary  calcium score which is 92nd percentile for age, race and sex matched controls   Anemia    Anxiety    Aortic atherosclerosis (HCC)    Arthritis    Backache, unspecified    Blood transfusion without reported diagnosis    Cataract    bil eyes   Depression    Hemiplegia, unspecified, affecting unspecified side    Lacunar infarction (HCC)    Other and unspecified hyperlipidemia    Polymyalgia (HCC)    Retinal defect, unspecified    tear of retina   Rotator cuff injury    right shoulder   Unspecified essential hypertension    Venous tributary (branch) occlusion of retina    Past Surgical History:  Procedure Laterality Date   ABDOMINAL HYSTERECTOMY     CHOLECYSTECTOMY     COLONOSCOPY  01/11/2010   per Dr. Carman Ching, internal hemorrhoids only, repeat in 10 yrs    SPHINCTEROTOMY     UPPER GASTROINTESTINAL ENDOSCOPY     Social History:  reports that she has never smoked. She has never used smokeless tobacco. She reports that she does not drink alcohol and does not use drugs.  Allergies  Allergen Reactions   Amoxicillin Nausea Only   Meloxicam Other (See Comments)    Muscle weakness     Family History  Problem Relation Age of Onset   Heart disease Mother        died at age 68   Hyperlipidemia Brother    Hypertension Brother    Diabetes Brother    Heart  disease Brother        CAD/MI/CABG   Thyroid disease Daughter    Colon cancer Neg Hx    Esophageal cancer Neg Hx    Rectal cancer Neg Hx    Stomach cancer Neg Hx     Prior to Admission medications   Medication Sig Start Date End Date Taking? Authorizing Provider  enalapril (VASOTEC) 20 MG tablet TAKE 1 TABLET BY MOUTH EVERY DAY 04/05/23  Yes Nelwyn Salisbury, MD  hydrochlorothiazide (HYDRODIURIL) 25 MG tablet TAKE 1 TABLET BY MOUTH EVERY DAY 04/05/23  Yes Nelwyn Salisbury, MD    Physical Exam: Vitals:   06/01/23 1427 06/01/23 1430 06/01/23 1514 06/01/23 1515  BP: (!) 171/85 (!) 176/82  (!) 197/102  Pulse: (!) 105 (!)  109 (!) 120 (!) 122  Resp: (!) 23 16 18  (!) 29  Temp:   98.8 F (37.1 C)   TempSrc:   Oral   SpO2: 95% 97% 98% 98%  Weight:      Height:       General:  Appears calm and comfortable and is in NAD Eyes:  PERRL, EOMI, normal lids, iris ENT:  grossly normal hearing, lips & tongue, dry mucous membranes; appropriate dentition Neck:  no LAD, masses or thyromegaly; no carotid bruits Cardiovascular:  RRR, no m/r/g. No LE edema.  Respiratory:   CTA bilaterally with no wheezes/rales/rhonchi.  Normal respiratory effort. Abdomen:  soft, NT, ND, NABS Back:   normal alignment, no CVAT Skin:  no rash or induration seen on limited exam Musculoskeletal:  grossly normal tone BUE Right LE: shortened and abducted. TTP over greater trochanteric process. No erythema, edema of hip/knee or ankle. Can move toes, sensation intact. Pedal pulses intact.  Lower extremity:  No LE edema.  Limited foot exam with no ulcerations.  2+ distal pulses. Psychiatric:  grossly normal mood and affect, speech fluent and appropriate, AOx3 Neurologic:  CN 2-12 grossly intact, moves all extremities in coordinated fashion, sensation intact   Radiological Exams on Admission: Independently reviewed - see discussion in A/P where applicable  DG Femur Min 2 Views Right  Result Date: 06/01/2023 CLINICAL DATA:  Fall, right hip pain EXAM: RIGHT FEMUR 2 VIEWS; PELVIS - 1-2 VIEW COMPARISON:  09/25/2021 FINDINGS: Acute, comminuted intertrochanteric fracture of the proximal right femur with 90 degrees of varus angulation. There is shortening of the femoral shaft. Lesser trochanteric fragment is mildly displaced. Hip joint alignment is maintained without dislocation. No pelvic diastasis. No lytic or sclerotic bone lesion identified. IMPRESSION: Acute, comminuted intertrochanteric fracture of the proximal right femur with varus angulation. Electronically Signed   By: Duanne Guess D.O.   On: 06/01/2023 15:14   DG Pelvis 1-2 Views  Result  Date: 06/01/2023 CLINICAL DATA:  Fall, right hip pain EXAM: RIGHT FEMUR 2 VIEWS; PELVIS - 1-2 VIEW COMPARISON:  09/25/2021 FINDINGS: Acute, comminuted intertrochanteric fracture of the proximal right femur with 90 degrees of varus angulation. There is shortening of the femoral shaft. Lesser trochanteric fragment is mildly displaced. Hip joint alignment is maintained without dislocation. No pelvic diastasis. No lytic or sclerotic bone lesion identified. IMPRESSION: Acute, comminuted intertrochanteric fracture of the proximal right femur with varus angulation. Electronically Signed   By: Duanne Guess D.O.   On: 06/01/2023 15:14   DG Chest 1 View  Result Date: 06/01/2023 CLINICAL DATA:  Status post fall. EXAM: CHEST  1 VIEW COMPARISON:  September 25, 2021 FINDINGS: The heart size and mediastinal contours are within normal limits. Both lungs  are clear. The visualized skeletal structures are unremarkable. Calcific atherosclerotic disease of the aorta. IMPRESSION: No active disease. Electronically Signed   By: Ted Mcalpine M.D.   On: 06/01/2023 15:13   CT PELVIS WO CONTRAST  Result Date: 06/01/2023 CLINICAL DATA:  Fall with right thigh pain EXAM: CT PELVIS WITHOUT CONTRAST TECHNIQUE: Multidetector CT imaging of the pelvis was performed following the standard protocol without intravenous contrast. RADIATION DOSE REDUCTION: This exam was performed according to the departmental dose-optimization program which includes automated exposure control, adjustment of the mA and/or kV according to patient size and/or use of iterative reconstruction technique. COMPARISON:  Same-day pelvis radiograph FINDINGS: Urinary Tract: No focal bladder wall thickening. Bowel: Partially imaged bowel without bowel wall thickening, distention, or inflammatory changes. Colonic diverticulosis without acute diverticulitis. Normal appendix. Vascular/Lymphatic: Atherosclerosis. No enlarged abdominal or pelvic lymph nodes. Reproductive:  No adnexal masses. Other: No free fluid, fluid collection, or free air. Musculoskeletal: Comminuted right intertrochanteric fracture with varus angulation. Asymmetric expansion of the musculature surrounding the right greater trochanter with increased surrounding soft tissue density. The right femoroacetabular joint is intact. Degenerative changes of the bilateral hips. IMPRESSION: 1. Comminuted right intertrochanteric femur fracture with varus angulation. 2. Asymmetric expansion of the musculature surrounding the right greater trochanter with increased surrounding soft tissue density, likely hematoma. Electronically Signed   By: Agustin Cree M.D.   On: 06/01/2023 15:01   CT Head Wo Contrast  Result Date: 06/01/2023 CLINICAL DATA:  Head trauma, minor (Age >= 65y); Neck trauma (Age >= 65y). Lost balance and fell in the kitchen, a hitting the right side of the head on a chair. EXAM: CT HEAD WITHOUT CONTRAST CT CERVICAL SPINE WITHOUT CONTRAST TECHNIQUE: Multidetector CT imaging of the head and cervical spine was performed following the standard protocol without intravenous contrast. Multiplanar CT image reconstructions of the cervical spine were also generated. RADIATION DOSE REDUCTION: This exam was performed according to the departmental dose-optimization program which includes automated exposure control, adjustment of the mA and/or kV according to patient size and/or use of iterative reconstruction technique. COMPARISON:  CT head 09/10/2022 FINDINGS: CT HEAD FINDINGS Brain: There is no evidence of an acute infarct, intracranial hemorrhage, mass, midline shift, or extra-axial fluid collection. The ventricles and sulci are within normal limits for age. Cerebral white matter hypodensities are similar to the prior CT and are nonspecific but compatible with mild chronic small vessel ischemic disease. A small chronic left pontine infarct is again noted. Vascular: Calcified atherosclerosis at the skull base. No  hyperdense vessel. Skull: No acute fracture or suspicious osseous lesion. Sinuses/Orbits: Minimal mucosal thickening in the paranasal sinuses. Clear mastoid air cells. Bilateral cataract extraction. Other: Small right parietal scalp hematoma. CT CERVICAL SPINE FINDINGS Alignment: Mild reversal of the normal cervical lordosis. Trace anterolisthesis of C4 on C5 and C7 on T1 and trace retrolisthesis of C5 on C6 and C6 on C7. Skull base and vertebrae: Congenital vertebral body and posterior element fusion at C3-4. No acute fracture or suspicious osseous lesion. Asymmetric left-sided C1-2 arthropathy. Soft tissues and spinal canal: No prevertebral fluid or swelling. No visible canal hematoma. Disc levels: Advanced disc degeneration at C5-6 and C6-7. Advanced facet arthrosis on the left at C2-3 and C4-5. Mild spinal stenosis and severe right neural foraminal stenosis at C5-6. Upper chest: Clear lung apices. Other: Moderate calcific atherosclerosis at the right greater than left carotid bifurcations. IMPRESSION: 1. No evidence of acute intracranial abnormality or cervical spine fracture. 2. Small right parietal scalp hematoma. Electronically  Signed   By: Sebastian Ache M.D.   On: 06/01/2023 14:10   CT Cervical Spine Wo Contrast  Result Date: 06/01/2023 CLINICAL DATA:  Head trauma, minor (Age >= 65y); Neck trauma (Age >= 65y). Lost balance and fell in the kitchen, a hitting the right side of the head on a chair. EXAM: CT HEAD WITHOUT CONTRAST CT CERVICAL SPINE WITHOUT CONTRAST TECHNIQUE: Multidetector CT imaging of the head and cervical spine was performed following the standard protocol without intravenous contrast. Multiplanar CT image reconstructions of the cervical spine were also generated. RADIATION DOSE REDUCTION: This exam was performed according to the departmental dose-optimization program which includes automated exposure control, adjustment of the mA and/or kV according to patient size and/or use of iterative  reconstruction technique. COMPARISON:  CT head 09/10/2022 FINDINGS: CT HEAD FINDINGS Brain: There is no evidence of an acute infarct, intracranial hemorrhage, mass, midline shift, or extra-axial fluid collection. The ventricles and sulci are within normal limits for age. Cerebral white matter hypodensities are similar to the prior CT and are nonspecific but compatible with mild chronic small vessel ischemic disease. A small chronic left pontine infarct is again noted. Vascular: Calcified atherosclerosis at the skull base. No hyperdense vessel. Skull: No acute fracture or suspicious osseous lesion. Sinuses/Orbits: Minimal mucosal thickening in the paranasal sinuses. Clear mastoid air cells. Bilateral cataract extraction. Other: Small right parietal scalp hematoma. CT CERVICAL SPINE FINDINGS Alignment: Mild reversal of the normal cervical lordosis. Trace anterolisthesis of C4 on C5 and C7 on T1 and trace retrolisthesis of C5 on C6 and C6 on C7. Skull base and vertebrae: Congenital vertebral body and posterior element fusion at C3-4. No acute fracture or suspicious osseous lesion. Asymmetric left-sided C1-2 arthropathy. Soft tissues and spinal canal: No prevertebral fluid or swelling. No visible canal hematoma. Disc levels: Advanced disc degeneration at C5-6 and C6-7. Advanced facet arthrosis on the left at C2-3 and C4-5. Mild spinal stenosis and severe right neural foraminal stenosis at C5-6. Upper chest: Clear lung apices. Other: Moderate calcific atherosclerosis at the right greater than left carotid bifurcations. IMPRESSION: 1. No evidence of acute intracranial abnormality or cervical spine fracture. 2. Small right parietal scalp hematoma. Electronically Signed   By: Sebastian Ache M.D.   On: 06/01/2023 14:10    EKG: Independently reviewed.  Sinus tachycardia with rate 113; nonspecific ST changes with no evidence of acute ischemia Prolonged QT   Labs on Admission: I have personally reviewed the available labs  and imaging studies at the time of the admission.  Pertinent labs:   Potassium: 3.1   Assessment and Plan: Principal Problem:   Closed intertrochanteric fracture of right hip, initial encounter [facility]) Active Problems:   Hypokalemia   Prolonged QT interval   Essential hypertension   Polymyalgia rheumatica (HCC)   CKD stage G2/A2, GFR 60-89 and albumin creatinine ratio 30-299 mg/g   Agatston coronary artery calcium score greater than 400    Assessment and Plan: * Closed intertrochanteric fracture of right hip, initial encounter (HCC) 77 year old presenting with mechanical fall at home and comminuted right intertrochanteric femur fracture with varus angulation.  -admit to tele -mechanical fall, no prodromal symptoms  -prolonged qt in setting of hypokalemia, keep on tele  -ortho consulted (Dr. [name]) with plans for OR today  -NPO -check INR, type and screen, UA -SCDs -pain medication with tylenol, norco and morphine for severe pain -hip fracture protocol    Hypokalemia Check magnesium Given in ED, give additional Trend  Prolonged QT interval Optimize electrolytes Keep on telemetry Avoid qt prolonging drugs  Repeat ekg in AM    Essential hypertension Elevated, but likely due to pain Oral meds on hold while NPO Labetalol PRN   Polymyalgia rheumatica (HCC) Followed by rheumatology, but on no medication  Uses walker to ambulate   CKD stage G2/A2, GFR 60-89 and albumin creatinine ratio 30-299 mg/g Stable, continue to monitor   Agatston coronary artery calcium score greater than 400 Elevated coronary calcium score at 827 which is 92nd percentile for age, race and sex matched controls.  Patient has been referred to lipid clinic as patient was supposed to start Crestor but did not want to.  Was supposed to start aspirin 81 mg daily, but has not done this.      Advance Care Planning:   Code Status: Full Code   Consults: ortho: Dr. [name]  DVT  Prophylaxis: SCDs   Family Communication: husband on the phone   Severity of Illness: The appropriate patient status for this patient is INPATIENT. Inpatient status is judged to be reasonable and necessary in order to provide the required intensity of service to ensure the patient's safety. The patient's presenting symptoms, physical exam findings, and initial radiographic and laboratory data in the context of their chronic comorbidities is felt to place them at high risk for further clinical deterioration. Furthermore, it is not anticipated that the patient will be medically stable for discharge from the hospital within 2 midnights of admission.   * I certify that at the point of admission it is my clinical judgment that the patient will require inpatient hospital care spanning beyond 2 midnights from the point of admission due to high intensity of service, high risk for further deterioration and high frequency of surveillance required.*  Author: Orland Mustard, MD 06/01/2023 4:22 PM  For on call review [email or web address].

## 2023-06-01 NOTE — Anesthesia Postprocedure Evaluation (Signed)
Anesthesia Post Note  Patient: [name]  Procedure(s) Performed: INTRAMEDULLARY (IM) NAIL INTERTROCHANTERIC (Right)     Patient location during evaluation: PACU Anesthesia Type: General Level of consciousness: awake Pain management: pain level controlled Vital Signs Assessment: post-procedure vital signs reviewed and stable Respiratory status: spontaneous breathing, nonlabored ventilation and respiratory function stable Cardiovascular status: blood pressure returned to baseline and stable Postop Assessment: no apparent nausea or vomiting Anesthetic complications: no   No notable events documented.  Last Vitals:  Vitals:   06/01/23 2035 06/01/23 2053  BP: 138/72 130/65  Pulse: 86 86  Resp: 13   Temp: 36.5 C 36.5 C  SpO2: 100% 100%    Last Pain:  Vitals:   06/01/23 2053  TempSrc: Oral  PainSc:                  [name]

## 2023-06-01 NOTE — Transfer of Care (Signed)
Immediate Anesthesia Transfer of Care Note  Patient: [name]  Procedure(s) Performed: INTRAMEDULLARY (IM) NAIL INTERTROCHANTERIC (Right)  Patient Location: PACU  Anesthesia Type:General  Level of Consciousness: drowsy and patient cooperative  Airway & Oxygen Therapy: Patient Spontanous Breathing  Post-op Assessment: Report given to RN, Post -op Vital signs reviewed and stable, and Patient moving all extremities X 4  Post vital signs: Reviewed and stable  Last Vitals:  Vitals Value Taken Time  BP 154/79 06/01/23 1936  Temp    Pulse 88 06/01/23 1938  Resp 16 06/01/23 1938  SpO2 96 % 06/01/23 1938  Vitals shown include unfiled device data.  Last Pain:  Vitals:   06/01/23 1642  TempSrc: Oral  PainSc:          Complications: No notable events documented.

## 2023-06-01 NOTE — Consult Note (Signed)
Reason for Consult: Right hip fracture Referring Physician: Dr. [name] is an 77 y.o. female.  HPI: [name] is a 77 year old ambulatory female who occasionally uses a walker at home who lives with her husband.  She had a mechanical fall today.  Reports immediate onset of right hip pain but denies any other orthopedic complaints.  No loss of consciousness.  Evaluated in the emergency department and noted to have acute intertrochanteric fracture.  Past Medical History:  Diagnosis Date   Agatston coronary artery calcium score greater than 400    827 coronary calcium score which is 92nd percentile for age, race and sex matched controls   Anemia    Anxiety    Aortic atherosclerosis (HCC)    Arthritis    Backache, unspecified    Blood transfusion without reported diagnosis    Cataract    bil eyes   Depression    Hemiplegia, unspecified, affecting unspecified side    Lacunar infarction (HCC)    Other and unspecified hyperlipidemia    Polymyalgia (HCC)    Retinal defect, unspecified    tear of retina   Rotator cuff injury    right shoulder   Unspecified essential hypertension    Venous tributary (branch) occlusion of retina     Past Surgical History:  Procedure Laterality Date   ABDOMINAL HYSTERECTOMY     CHOLECYSTECTOMY     COLONOSCOPY  01/11/2010   per Dr. Carman Ching, internal hemorrhoids only, repeat in 10 yrs    SPHINCTEROTOMY     UPPER GASTROINTESTINAL ENDOSCOPY      Family History  Problem Relation Age of Onset   Heart disease Mother        died at age 68   Hyperlipidemia Brother    Hypertension Brother    Diabetes Brother    Heart disease Brother        CAD/MI/CABG   Thyroid disease Daughter    Colon cancer Neg Hx    Esophageal cancer Neg Hx    Rectal cancer Neg Hx    Stomach cancer Neg Hx     Social History:  reports that she has never smoked. She has never used smokeless tobacco. She reports that she does not drink alcohol and does not use  drugs.  Allergies:  Allergies  Allergen Reactions   Amoxicillin Nausea Only   Meloxicam Other (See Comments)    Muscle weakness     Medications: I have reviewed the patient's current medications.  Results for orders placed or performed during the hospital encounter of 06/01/23 (from the past 48 hour(s))  CBC with Differential     Status: Abnormal   Collection Time: 06/01/23  1:00 PM  Result Value Ref Range   WBC 10.3 4.0 - 10.5 K/uL   RBC 5.07 3.87 - 5.11 MIL/uL   Hemoglobin 14.4 12.0 - 15.0 g/dL   HCT 04.5 40.9 - 81.1 %   MCV 87.6 80.0 - 100.0 fL   MCH 28.4 26.0 - 34.0 pg   MCHC 32.4 30.0 - 36.0 g/dL   RDW 91.4 78.2 - 95.6 %   Platelets 181 150 - 400 K/uL   nRBC 0.0 0.0 - 0.2 %   Neutrophils Relative % 83 %   Neutro Abs 8.5 (H) 1.7 - 7.7 K/uL   Lymphocytes Relative 11 %   Lymphs Abs 1.2 0.7 - 4.0 K/uL   Monocytes Relative 5 %   Monocytes Absolute 0.5 0.1 - 1.0 K/uL   Eosinophils Relative 0 %  Eosinophils Absolute 0.0 0.0 - 0.5 K/uL   Basophils Relative 0 %   Basophils Absolute 0.0 0.0 - 0.1 K/uL   Immature Granulocytes 1 %   Abs Immature Granulocytes 0.05 0.00 - 0.07 K/uL    Comment: Performed at [facility], [address] [place], [place] [address]  Comprehensive metabolic panel     Status: Abnormal   Collection Time: 06/01/23  1:00 PM  Result Value Ref Range   Sodium 140 135 - 145 mmol/L   Potassium 3.1 (L) 3.5 - 5.1 mmol/L   Chloride 103 98 - 111 mmol/L   CO2 24 22 - 32 mmol/L   Glucose, Bld 134 (H) 70 - 99 mg/dL    Comment: Glucose reference range applies only to samples taken after fasting for at least 8 hours.   BUN 16 8 - 23 mg/dL   Creatinine, Ser 5.36 0.44 - 1.00 mg/dL   Calcium 9.4 8.9 - 64.4 mg/dL   Total Protein 7.4 6.5 - 8.1 g/dL   Albumin 3.7 3.5 - 5.0 g/dL   AST 26 15 - 41 U/L   ALT 14 0 - 44 U/L   Alkaline Phosphatase 61 38 - 126 U/L   Total Bilirubin 1.1 <1.2 mg/dL   GFR, Estimated >03 >47 mL/min    Comment: (NOTE) Calculated  using the CKD-EPI Creatinine Equation (2021)    Anion gap 13 5 - 15    Comment: Performed at [facility], [address] [place], [place] [address]  Magnesium     Status: Abnormal   Collection Time: 06/01/23  3:58 PM  Result Value Ref Range   Magnesium 1.6 (L) 1.7 - 2.4 mg/dL    Comment: Performed at [facility], [address] [place], [place] [address]  Protime-INR     Status: None   Collection Time: 06/01/23  3:58 PM  Result Value Ref Range   Prothrombin Time 14.3 11.4 - 15.2 seconds   INR 1.1 0.8 - 1.2    Comment: (NOTE) INR goal varies based on device and disease states. Performed at [facility], [address] [place], [place] [address]     DG Femur Min 2 Views Right  Result Date: 06/01/2023 CLINICAL DATA:  Fall, right hip pain EXAM: RIGHT FEMUR 2 VIEWS; PELVIS - 1-2 VIEW COMPARISON:  09/25/2021 FINDINGS: Acute, comminuted intertrochanteric fracture of the proximal right femur with 90 degrees of varus angulation. There is shortening of the femoral shaft. Lesser trochanteric fragment is mildly displaced. Hip joint alignment is maintained without dislocation. No pelvic diastasis. No lytic or sclerotic bone lesion identified. IMPRESSION: Acute, comminuted intertrochanteric fracture of the proximal right femur with varus angulation. Electronically Signed   By: Duanne Guess D.O.   On: 06/01/2023 15:14   DG Pelvis 1-2 Views  Result Date: 06/01/2023 CLINICAL DATA:  Fall, right hip pain EXAM: RIGHT FEMUR 2 VIEWS; PELVIS - 1-2 VIEW COMPARISON:  09/25/2021 FINDINGS: Acute, comminuted intertrochanteric fracture of the proximal right femur with 90 degrees of varus angulation. There is shortening of the femoral shaft. Lesser trochanteric fragment is mildly displaced. Hip joint alignment is maintained without dislocation. No pelvic diastasis. No lytic or sclerotic bone lesion identified. IMPRESSION: Acute, comminuted intertrochanteric fracture of the proximal right  femur with varus angulation. Electronically Signed   By: Duanne Guess D.O.   On: 06/01/2023 15:14   DG Chest 1 View  Result Date: 06/01/2023 CLINICAL DATA:  Status post fall. EXAM: CHEST  1  VIEW COMPARISON:  September 25, 2021 FINDINGS: The heart size and mediastinal contours are within normal limits. Both lungs are clear. The visualized skeletal structures are unremarkable. Calcific atherosclerotic disease of the aorta. IMPRESSION: No active disease. Electronically Signed   By: Ted Mcalpine M.D.   On: 06/01/2023 15:13   CT PELVIS WO CONTRAST  Result Date: 06/01/2023 CLINICAL DATA:  Fall with right thigh pain EXAM: CT PELVIS WITHOUT CONTRAST TECHNIQUE: Multidetector CT imaging of the pelvis was performed following the standard protocol without intravenous contrast. RADIATION DOSE REDUCTION: This exam was performed according to the departmental dose-optimization program which includes automated exposure control, adjustment of the mA and/or kV according to patient size and/or use of iterative reconstruction technique. COMPARISON:  Same-day pelvis radiograph FINDINGS: Urinary Tract: No focal bladder wall thickening. Bowel: Partially imaged bowel without bowel wall thickening, distention, or inflammatory changes. Colonic diverticulosis without acute diverticulitis. Normal appendix. Vascular/Lymphatic: Atherosclerosis. No enlarged abdominal or pelvic lymph nodes. Reproductive: No adnexal masses. Other: No free fluid, fluid collection, or free air. Musculoskeletal: Comminuted right intertrochanteric fracture with varus angulation. Asymmetric expansion of the musculature surrounding the right greater trochanter with increased surrounding soft tissue density. The right femoroacetabular joint is intact. Degenerative changes of the bilateral hips. IMPRESSION: 1. Comminuted right intertrochanteric femur fracture with varus angulation. 2. Asymmetric expansion of the musculature surrounding the right greater  trochanter with increased surrounding soft tissue density, likely hematoma. Electronically Signed   By: Agustin Cree M.D.   On: 06/01/2023 15:01   CT Head Wo Contrast  Result Date: 06/01/2023 CLINICAL DATA:  Head trauma, minor (Age >= 65y); Neck trauma (Age >= 65y). Lost balance and fell in the kitchen, a hitting the right side of the head on a chair. EXAM: CT HEAD WITHOUT CONTRAST CT CERVICAL SPINE WITHOUT CONTRAST TECHNIQUE: Multidetector CT imaging of the head and cervical spine was performed following the standard protocol without intravenous contrast. Multiplanar CT image reconstructions of the cervical spine were also generated. RADIATION DOSE REDUCTION: This exam was performed according to the departmental dose-optimization program which includes automated exposure control, adjustment of the mA and/or kV according to patient size and/or use of iterative reconstruction technique. COMPARISON:  CT head 09/10/2022 FINDINGS: CT HEAD FINDINGS Brain: There is no evidence of an acute infarct, intracranial hemorrhage, mass, midline shift, or extra-axial fluid collection. The ventricles and sulci are within normal limits for age. Cerebral white matter hypodensities are similar to the prior CT and are nonspecific but compatible with mild chronic small vessel ischemic disease. A small chronic left pontine infarct is again noted. Vascular: Calcified atherosclerosis at the skull base. No hyperdense vessel. Skull: No acute fracture or suspicious osseous lesion. Sinuses/Orbits: Minimal mucosal thickening in the paranasal sinuses. Clear mastoid air cells. Bilateral cataract extraction. Other: Small right parietal scalp hematoma. CT CERVICAL SPINE FINDINGS Alignment: Mild reversal of the normal cervical lordosis. Trace anterolisthesis of C4 on C5 and C7 on T1 and trace retrolisthesis of C5 on C6 and C6 on C7. Skull base and vertebrae: Congenital vertebral body and posterior element fusion at C3-4. No acute fracture or  suspicious osseous lesion. Asymmetric left-sided C1-2 arthropathy. Soft tissues and spinal canal: No prevertebral fluid or swelling. No visible canal hematoma. Disc levels: Advanced disc degeneration at C5-6 and C6-7. Advanced facet arthrosis on the left at C2-3 and C4-5. Mild spinal stenosis and severe right neural foraminal stenosis at C5-6. Upper chest: Clear lung apices. Other: Moderate calcific atherosclerosis at the right greater than left carotid bifurcations.  IMPRESSION: 1. No evidence of acute intracranial abnormality or cervical spine fracture. 2. Small right parietal scalp hematoma. Electronically Signed   By: Sebastian Ache M.D.   On: 06/01/2023 14:10   CT Cervical Spine Wo Contrast  Result Date: 06/01/2023 CLINICAL DATA:  Head trauma, minor (Age >= 65y); Neck trauma (Age >= 65y). Lost balance and fell in the kitchen, a hitting the right side of the head on a chair. EXAM: CT HEAD WITHOUT CONTRAST CT CERVICAL SPINE WITHOUT CONTRAST TECHNIQUE: Multidetector CT imaging of the head and cervical spine was performed following the standard protocol without intravenous contrast. Multiplanar CT image reconstructions of the cervical spine were also generated. RADIATION DOSE REDUCTION: This exam was performed according to the departmental dose-optimization program which includes automated exposure control, adjustment of the mA and/or kV according to patient size and/or use of iterative reconstruction technique. COMPARISON:  CT head 09/10/2022 FINDINGS: CT HEAD FINDINGS Brain: There is no evidence of an acute infarct, intracranial hemorrhage, mass, midline shift, or extra-axial fluid collection. The ventricles and sulci are within normal limits for age. Cerebral white matter hypodensities are similar to the prior CT and are nonspecific but compatible with mild chronic small vessel ischemic disease. A small chronic left pontine infarct is again noted. Vascular: Calcified atherosclerosis at the skull base. No  hyperdense vessel. Skull: No acute fracture or suspicious osseous lesion. Sinuses/Orbits: Minimal mucosal thickening in the paranasal sinuses. Clear mastoid air cells. Bilateral cataract extraction. Other: Small right parietal scalp hematoma. CT CERVICAL SPINE FINDINGS Alignment: Mild reversal of the normal cervical lordosis. Trace anterolisthesis of C4 on C5 and C7 on T1 and trace retrolisthesis of C5 on C6 and C6 on C7. Skull base and vertebrae: Congenital vertebral body and posterior element fusion at C3-4. No acute fracture or suspicious osseous lesion. Asymmetric left-sided C1-2 arthropathy. Soft tissues and spinal canal: No prevertebral fluid or swelling. No visible canal hematoma. Disc levels: Advanced disc degeneration at C5-6 and C6-7. Advanced facet arthrosis on the left at C2-3 and C4-5. Mild spinal stenosis and severe right neural foraminal stenosis at C5-6. Upper chest: Clear lung apices. Other: Moderate calcific atherosclerosis at the right greater than left carotid bifurcations. IMPRESSION: 1. No evidence of acute intracranial abnormality or cervical spine fracture. 2. Small right parietal scalp hematoma. Electronically Signed   By: Sebastian Ache M.D.   On: 06/01/2023 14:10    Review of Systems  Musculoskeletal:  Positive for arthralgias.  All other systems reviewed and are negative.  Blood pressure (!) 169/92, pulse (!) 101, temperature 98.7 F (37.1 C), temperature source Oral, resp. rate 17, height 5\' 2"  (1.575 m), weight 63 kg, SpO2 98%. Physical Exam Vitals reviewed.  HENT:     Head: Normocephalic.     Nose: Nose normal.     Mouth/Throat:     Mouth: Mucous membranes are moist.  Eyes:     Pupils: Pupils are equal, round, and reactive to light.  Cardiovascular:     Rate and Rhythm: Normal rate.     Pulses: Normal pulses.  Pulmonary:     Effort: Pulmonary effort is normal.  Abdominal:     General: Abdomen is flat.  Musculoskeletal:     Cervical back: Normal range of motion.   Skin:    General: Skin is warm.     Capillary Refill: Capillary refill takes less than 2 seconds.  Neurological:     General: No focal deficit present.     Mental Status: She is alert.  Psychiatric:  Mood and Affect: Mood normal.   On physical examination the patient has good range of motion of bilateral upper extremities wrist elbow and shoulder.  Radial pulse intact.  Grip strength okay bilaterally.  Neck range of motion intact.  Left lower extremity has good ankle dorsiflexion plantarflexion quad and hamstring strength with no knee effusion.  On the right-hand side she holds her hip in a flexed position.  Ankle dorsiflexion plantarflexion intact.  Pedal pulses palpable. Assessment/Plan: Impression is right hip intertrochanteric fracture.  Plan is open reduction internal fixation with intramedullary hip screw.  Risk and benefits are discussed including not limited to infection nerve and vessel damage delayed healing nonunion malunion as well as potential need for more surgery as well as delayed weightbearing and potential requirement for short-term nursing home stay.  Lesser trochanter is off and that may affect her ambulatory ability postop.  Do anticipate touchdown weightbearing for at least 2 weeks after surgery depending on bone quality and how the fracture looks.  All questions answered.  Would also like to check vitamin D level tomorrow with blood drawl in order to optimize fracture healing potential.  Burnard Bunting 06/01/2023, 5:00 PM

## 2023-06-01 NOTE — Assessment & Plan Note (Signed)
Stable, continue to monitor  ?

## 2023-06-01 NOTE — ED Provider Notes (Signed)
[facility] Provider Note   CSN: [id number] Arrival date & time: 06/01/23  1246     History  No chief complaint on file.   [name] is a 77 y.o. female.  77 year old female with prior medical history as detailed below presents for evaluation.  Patient presents from home.  She was in her kitchen when she lost her balance and fell.  She did strike her head.  She also complains of pain to the right hip after the fall.  She was unable to stand secondary to the pain in the right hip.  The history is provided by the patient and medical records.       Home Medications Prior to Admission medications   Medication Sig Start Date End Date Taking? Authorizing Provider  enalapril (VASOTEC) 20 MG tablet TAKE 1 TABLET BY MOUTH EVERY DAY 04/05/23   Nelwyn Salisbury, MD  hydrochlorothiazide (HYDRODIURIL) 25 MG tablet TAKE 1 TABLET BY MOUTH EVERY DAY 04/05/23   Nelwyn Salisbury, MD  methylPREDNISolone (MEDROL DOSEPAK) 4 MG TBPK tablet As directed 02/19/23   Nelwyn Salisbury, MD      Allergies    Amoxicillin and Meloxicam    Review of Systems   Review of Systems  All other systems reviewed and are negative.   Physical Exam Updated Vital Signs There were no vitals taken for this visit. Physical Exam Vitals and nursing note reviewed.  Constitutional:      General: She is not in acute distress.    Appearance: Normal appearance. She is well-developed.  HENT:     Head: Normocephalic.     Comments: Contusion noted to the right posterior scalp Eyes:     Conjunctiva/sclera: Conjunctivae normal.     Pupils: Pupils are equal, round, and reactive to light.  Cardiovascular:     Rate and Rhythm: Normal rate and regular rhythm.     Heart sounds: Normal heart sounds.  Pulmonary:     Effort: Pulmonary effort is normal. No respiratory distress.     Breath sounds: Normal breath sounds.  Abdominal:     General: There is no distension.     Palpations:  Abdomen is soft.     Tenderness: There is no abdominal tenderness.  Musculoskeletal:        General: Tenderness present. No deformity. Normal range of motion.     Cervical back: Normal range of motion and neck supple.  Skin:    General: Skin is warm and dry.  Neurological:     General: No focal deficit present.     Mental Status: She is alert and oriented to person, place, and time.     ED Results / Procedures / Treatments   Labs (all labs ordered are listed, but only abnormal results are displayed) Labs Reviewed - No data to display  EKG None  Radiology No results found.  Procedures Procedures    Medications Ordered in ED Medications - No data to display  ED Course/ Medical Decision Making/ A&P                                 Medical Decision Making Amount and/or Complexity of Data Reviewed Labs: ordered. Radiology: ordered.  Risk Prescription drug management.    Medical Screen Complete  This patient presented to the ED with complaint of fall, right hip pain.  This complaint involves an extensive number of treatment  options. The initial differential diagnosis includes, but is not limited to, from fall  This presentation is: Acute, Self-Limited, Previously Undiagnosed, Uncertain Prognosis, Complicated, Systemic Symptoms, and Threat to Life/Bodily Function  Patient presents after fall from standing that occurred at home.  Patient did strike her head.  CT imaging of the head is without acute abnormality.  Patient with likely right hip fracture as well on exam.  Imaging confirms right hip fracture.  Dr. [name], orthopedics, he is aware of case.  He would like to intervene on the right hip sometime this evening.  Screening labs did show potassium 3.1.  Hospitalist service made aware of case and need for admission.   Additional history obtained:  External records from outside sources obtained and reviewed including prior ED visits and prior Inpatient  records.    Lab Tests:  I ordered and personally interpreted labs.  The pertinent results include: CBC, CMP   Imaging Studies ordered:  I ordered imaging studies including CT head, CT C-spine, CT pelvis, plain films of chest, right femur, right hip I independently visualized and interpreted obtained imaging which showed right hip fracture I agree with the radiologist interpretation.   Cardiac Monitoring:  The patient was maintained on a cardiac monitor.  I personally viewed and interpreted the cardiac monitor which showed an underlying rhythm of: NSR   Medicines ordered:  I ordered medication including potassium, morphine for hypokalemia, pain Reevaluation of the patient after these medicines showed that the patient: improved    Problem List / ED Course:  Fall, right hip fracture  Disposition:  After consideration of the diagnostic results and the patients response to treatment, I feel that the patent would benefit from admission.          Final Clinical Impression(s) / ED Diagnoses Final diagnoses:  Fall, initial encounter  Injury of head, initial encounter  Closed fracture of right hip, initial encounter [facility])    Rx / DC Orders ED Discharge Orders     None         Wynetta Fines, MD 06/01/23 [phone]

## 2023-06-01 NOTE — Assessment & Plan Note (Signed)
Check magnesium Given in ED, give additional Trend

## 2023-06-01 NOTE — Assessment & Plan Note (Signed)
77 year old presenting with mechanical fall at home and comminuted right intertrochanteric femur fracture with varus angulation.  -admit to tele -mechanical fall, no prodromal symptoms  -prolonged qt in setting of hypokalemia, keep on tele  -ortho consulted (Dr. [name]) with plans for OR today  -NPO -check INR, type and screen, UA -SCDs -pain medication with tylenol, norco and morphine for severe pain -hip fracture protocol

## 2023-06-01 NOTE — Assessment & Plan Note (Signed)
Optimize electrolytes Keep on telemetry Avoid qt prolonging drugs  Repeat ekg in AM   

## 2023-06-02 ENCOUNTER — Inpatient Hospital Stay (HOSPITAL_COMMUNITY): Payer: Medicare Other

## 2023-06-02 DIAGNOSIS — D62 Acute posthemorrhagic anemia: Secondary | ICD-10-CM

## 2023-06-02 DIAGNOSIS — S72144D Nondisplaced intertrochanteric fracture of right femur, subsequent encounter for closed fracture with routine healing: Secondary | ICD-10-CM | POA: Diagnosis not present

## 2023-06-02 DIAGNOSIS — R338 Other retention of urine: Secondary | ICD-10-CM | POA: Diagnosis not present

## 2023-06-02 LAB — BASIC METABOLIC PANEL
Anion gap: 10 (ref 5–15)
BUN: 20 mg/dL (ref 8–23)
CO2: 23 mmol/L (ref 22–32)
Calcium: 8.2 mg/dL — ABNORMAL LOW (ref 8.9–10.3)
Chloride: 102 mmol/L (ref 98–111)
Creatinine, Ser: 0.84 mg/dL (ref 0.44–1.00)
GFR, Estimated: >60 mL/min (ref >60)
Glucose, Bld: 146 mg/dL — ABNORMAL HIGH (ref 70–99)
Potassium: 3.7 mmol/L (ref 3.5–5.1)
Sodium: 135 mmol/L (ref 135–145)

## 2023-06-02 LAB — CBC
HCT: 23.7 % — ABNORMAL LOW (ref 36.0–46.0)
HCT: 23.5 % — ABNORMAL LOW (ref 36.0–46.0)
Hemoglobin: 7.6 g/dL — ABNORMAL LOW (ref 12.0–15.0)
Hemoglobin: 7.9 g/dL — ABNORMAL LOW (ref 12.0–15.0)
MCH: 28.1 pg (ref 26.0–34.0)
MCH: 29.7 pg (ref 26.0–34.0)
MCHC: 32.1 g/dL (ref 30.0–36.0)
MCHC: 33.6 g/dL (ref 30.0–36.0)
MCV: 87.8 fL (ref 80.0–100.0)
MCV: 88.3 fL (ref 80.0–100.0)
Platelets: 144 10*3/uL — ABNORMAL LOW (ref 150–400)
Platelets: 143 10*3/uL — ABNORMAL LOW (ref 150–400)
RBC: 2.7 MIL/uL — ABNORMAL LOW (ref 3.87–5.11)
RBC: 2.66 MIL/uL — ABNORMAL LOW (ref 3.87–5.11)
RDW: 14.7 % (ref 11.5–15.5)
RDW: 14.8 % (ref 11.5–15.5)
WBC: 7.4 10*3/uL (ref 4.0–10.5)
WBC: 10.2 10*3/uL (ref 4.0–10.5)
nRBC: 0 % (ref 0.0–0.2)
nRBC: 0 % (ref 0.0–0.2)

## 2023-06-02 LAB — MAGNESIUM: Magnesium: 1.5 mg/dL — ABNORMAL LOW (ref 1.7–2.4)

## 2023-06-02 MED ORDER — MAGNESIUM SULFATE 2 GM/50ML IV SOLN
2.0000 g | Freq: Once | INTRAVENOUS | Status: AC
  Administered 2023-06-02: 2 g via INTRAVENOUS
  Filled 2023-06-02: qty 50

## 2023-06-02 MED ORDER — PANTOPRAZOLE SODIUM 40 MG PO TBEC
40.0000 mg | DELAYED_RELEASE_TABLET | Freq: Every day | ORAL | Status: DC
  Administered 2023-06-03 – 2023-06-04 (×2): 40 mg via ORAL
  Filled 2023-06-02 (×3): qty 1

## 2023-06-02 MED ORDER — VITAMIN D (ERGOCALCIFEROL) 1.25 MG (50000 UNIT) PO CAPS: 50000.0000 [IU] | ORAL_CAPSULE | ORAL | Status: DC

## 2023-06-02 NOTE — Progress Notes (Signed)
  Subjective: Patient stable.  Pain controlled.   Objective: Vital signs in last 24 hours: Temp:  [97.3 F (36.3 C)-98.8 F (37.1 C)] 97.8 F (36.6 C) (11/17 0710) Pulse Rate:  [80-122] 95 (11/17 0710) Resp:  [10-29] 16 (11/17 0414) BP: (118-211)/(53-102) 135/67 (11/17 0710) SpO2:  [95 %-100 %] 98 % (11/17 0710) Weight:  [63 kg] 63 kg (11/16 1311)  Intake/Output from previous day: 11/16 0701 - 11/17 0700 In: 1600 [I.V.:1000; IV Piggyback:600] Out: 950 [Urine:200; Blood:750] Intake/Output this shift: No intake/output data recorded.  Exam:  Dorsiflexion/Plantar flexion intact  Labs: Recent Labs    06/01/23 1300 06/02/23 0600  HGB 14.4 7.6*   Recent Labs    06/01/23 1300 06/02/23 0600  WBC 10.3 7.4  RBC 5.07 2.70*  HCT 44.4 23.7*  PLT 181 144*   Recent Labs    06/01/23 1300 06/02/23 0600  NA 140 135  K 3.1* 3.7  CL 103 102  CO2 24 23  BUN 16 20  CREATININE 0.93 0.84  GLUCOSE 134* 146*  CALCIUM 9.4 8.2*   Recent Labs    06/01/23 1558  INR 1.1    Assessment/Plan: Plan at this time is nonweightbearing right lower extremity.  Anticipate skilled nursing home placement Tuesday.  Vitamin D level pending to optimize metabolic conditions for fracture healing.  Hemoglobin low and she may need a transfusion depending on where she is at tomorrow.   G Scott Kyrra Prada 06/02/2023, 8:01 AM

## 2023-06-02 NOTE — Op Note (Signed)
[name], [place] MEDICAL RECORD NO: [id number] ACCOUNT NO: [id number] DATE OF BIRTH: October 27, 1945 FACILITY: [facility] LOCATION: [facility] PHYSICIAN: Graylin Shiver. [name], MD  Operative Report   DATE OF PROCEDURE: 06/02/2023  PREOPERATIVE DIAGNOSIS:  Right hip intertrochanteric fracture.  POSTOPERATIVE DIAGNOSIS:  Right hip intertrochanteric fracture.  PROCEDURE:  Right hip intertrochanteric fracture open reduction internal fixation using Smith and Nephew 10 x 36 cm intramedullary hip screw with one compression and one lag screw and no distal interlock.  SURGEON:  Graylin Shiver.  [name], MD  ASSISTANT:  Karenann Cai, PA  INDICATIONS:  This is an ambulatory 77 year old patient with PMR who presents for operative management of right hip intertrochanteric fracture following a fall.  He presents for operative management after explanation of risks and benefits.  DESCRIPTION OF PROCEDURE:  The patient was brought to the operating room where a general anesthetic was induced.  Preoperative antibiotics were administered.  Timeout was called.  TXA was administered.  The patient was placed on the fracture bed with the  hip in some traction and slight internal rotation.  Fracture reduction was achieved under fluoroscopy in terms of alignment, but there remained approximately 1 cm gap at the fracture line in the intertrochanteric region.  At this time, the right hip was  prescrubbed with alcohol and Betadine, allowed to air dry, prepped with DuraPrep solution, and draped in a sterile manner.  After calling a timeout, an incision was made at the level of the trochanteric region.  A bone hook was then placed and the tip  of the trochanteric region was pulled back laterally to facilitate optimal reduction of the fracture.  Guide pin was then placed under fluoroscopic guidance into the femoral canal.  With the fracture reduced, proximal reaming was performed.  Her canal  was small enough that it also required reaming up to  11.5 mm.  In accordance with preoperative templating, a size 10 nail was placed.  Initial pin for central lag screw placement was placed in the center-center portion of the head and neck.  The lag  screw was then placed.  Compression screw was then placed as well and we were able to achieve 10 mm of compression; however, because of the poor bone quality, the more the bone was compressed, the lag screw migrated superiorly.  Definitely remained  within the head, but was in the upper half of the neck and head region, which is not an ideal location.  Nonetheless, because the fracture is reduced, the plan was to keep the patient nonweightbearing to allow for fracture healing prior to initiation of  weightbearing, which could precipitate screw cutout.  Thorough irrigation was performed on all three incisions.  The incisions were then closed using a combination of #1 Vicryl suture, #0 Vicryl suture, 2-0 Vicryl suture, and 3-0 Monocryl with  Steri-Strips and Aquacel dressings applied.  The patient tolerated the procedure well without immediate complications and was transferred to the recovery room in stable condition.  Luke's assistance was required at all times for retraction, opening,  closing, and mobilization of tissue.  His assistance was a medical necessity.   PUS D: 06/02/2023 8:14:33 am T: 06/02/2023 8:40:00 am  JOB: [id number]/ [id number]

## 2023-06-02 NOTE — Evaluation (Signed)
Physical Therapy Evaluation Patient Details Name: [name] MRN: [id number] DOB: 1945-12-05 Today's Date: 06/02/2023  History of Present Illness  77 y.o. female presents to [facility] 06/01/23 after losing her balance and falling. Diagnosed w/ R intertrochanteric fx, s/p IM nail 11/16. PMHx: anemia, anxiety, aortic atherosclerosis, arthritis, hemiplegia unspecified, polymyalgia, R rotator cuff injury, HTN   Clinical Impression  Pt in bed upon arrival and agreeable to PT eval. Prior to admission, pt was independent with mobility with a RW or cane. Pt reports having two falls in the past ~8 months after feeling like her feet "got caught". Pt reports increased pain and weakness in B UE and LE in the weeks prior to admission. Pt had increased difficulty mobilizing and required MaxA to totalA for bed mobility. Attempted to stand x2, however, pt only able to minimally clear bottom. Pt had difficulty understanding WB precautions and how she would attempt to stand. Pt presents to therapy session with decreased LE strength, balance, activity tolerance, and mobility. Pt would benefit from acute skilled PT to address functional impairments. Recommending post-acute rehab <3 hrs to return to previous level of independence. Acute PT to follow.          If plan is discharge home, recommend the following: A lot of help with walking and/or transfers;A lot of help with bathing/dressing/bathroom;Assist for transportation;Help with stairs or ramp for entrance   Can travel by private vehicle   No    Equipment Recommendations None recommended by PT (TBD at next level of care)     Functional Status Assessment Patient has had a recent decline in their functional status and demonstrates the ability to make significant improvements in function in a reasonable and predictable amount of time.     Precautions / Restrictions Precautions Precautions: Fall Restrictions Weight Bearing Restrictions: Yes RLE Weight  Bearing: Non weight bearing      Mobility  Bed Mobility Overal bed mobility: Needs Assistance Bed Mobility: Supine to Sit, Sit to Supine     Supine to sit: Max assist, HOB elevated, Used rails Sit to supine: Total assist, HOB elevated, Used rails   General bed mobility comments: pt able to assist minimally with moving LE's towards EOB. MaxA for remainder of sup/sit. TotalA for all aspects for sit/supine    Transfers Overall transfer level: Needs assistance Equipment used: Rolling walker (2 wheels), None Transfers: Sit to/from Stand Sit to Stand: Total assist      General transfer comment: attempted STS x2, pt able to minimally clear bottom with anterior approach and totalA.    Ambulation/Gait General Gait Details: pt unable to clear bottom from bed       Balance Overall balance assessment: Needs assistance Sitting-balance support: Feet supported, Single extremity supported Sitting balance-Leahy Scale: Fair    Standing balance comment: pt unable to stand and maintain WB precautions         Pertinent Vitals/Pain Pain Assessment Pain Assessment: Faces Faces Pain Scale: Hurts whole lot Pain Location: R hip Pain Descriptors / Indicators: Aching, Grimacing, Discomfort Pain Intervention(s): Limited activity within patient's tolerance, Monitored during session, Repositioned, RN gave pain meds during session    Home Living Family/patient expects to be discharged to:: Skilled nursing facility    Home Equipment: Agricultural consultant (2 wheels);Cane - single point      Prior Function Prior Level of Function : Independent/Modified Independent;Driving;History of Falls (last six months)    Mobility Comments: Uses cane or RW for mobility. Has been having difficulty walking longer distance.  Fell while not using AD, "foot caught". Fall 6 months prior with "foot caught" ADLs Comments: independent with ADLs     Extremity/Trunk Assessment   Upper Extremity Assessment Upper  Extremity Assessment: Defer to OT evaluation    Lower Extremity Assessment Lower Extremity Assessment: Generalized weakness (limited L knee ext and L hip flexion due to pain. Pt unable lift L hip off the bed)    Cervical / Trunk Assessment Cervical / Trunk Assessment: Kyphotic  Communication   Communication Communication: No apparent difficulties Cueing Techniques: Verbal cues  Cognition Arousal: Alert Behavior During Therapy: WFL for tasks assessed/performed, Anxious Overall Cognitive Status: Impaired/Different from baseline    General Comments: Needs multiple cues to adhere to task, single step commands with increased time to process. Pt would repeatedly state that she did not know what was going on        General Comments General comments (skin integrity, edema, etc.): VSS on RA    Exercises General Exercises - Upper Extremity Chair Push Up: AROM, Both, 5 reps, Seated   Assessment/Plan    PT Assessment Patient needs continued PT services  PT Problem List Decreased strength;Decreased range of motion;Decreased activity tolerance;Decreased balance;Decreased mobility;Decreased coordination;Decreased knowledge of use of DME;Decreased safety awareness       PT Treatment Interventions DME instruction;Gait training;Functional mobility training;Therapeutic activities;Therapeutic exercise;Balance training;Patient/family education;Neuromuscular re-education    PT Goals (Current goals can be found in the Care Plan section)  Acute Rehab PT Goals Patient Stated Goal: to get better PT Goal Formulation: With patient/family Time For Goal Achievement: 06/16/23 Potential to Achieve Goals: Good    Frequency Min 1X/week        AM-PAC PT "6 Clicks" Mobility  Outcome Measure Help needed turning from your back to your side while in a flat bed without using bedrails?: A Lot Help needed moving from lying on your back to sitting on the side of a flat bed without using bedrails?: A  Lot Help needed moving to and from a bed to a chair (including a wheelchair)?: Total Help needed standing up from a chair using your arms (e.g., wheelchair or bedside chair)?: Total Help needed to walk in hospital room?: Total Help needed climbing 3-5 steps with a railing? : Total 6 Click Score: 8    End of Session Equipment Utilized During Treatment: Gait belt Activity Tolerance: Patient limited by pain Patient left: in bed;with call bell/phone within reach;with bed alarm set;with family/visitor present Nurse Communication: Mobility status;Patient requests pain meds PT Visit Diagnosis: Unsteadiness on feet (R26.81);Repeated falls (R29.6);Muscle weakness (generalized) (M62.81)    Time: 1610-9604 PT Time Calculation (min) (ACUTE ONLY): 45 min   Charges:   PT Evaluation $PT Eval Low Complexity: 1 Low PT Treatments $Therapeutic Activity: 8-22 mins PT General Charges $$ ACUTE PT VISIT: 1 Visit         Hilton Cork, PT, DPT Secure Chat Preferred  Rehab Office [phone]   Arturo Morton Brion Aliment 06/02/2023, 4:36 PM

## 2023-06-02 NOTE — Evaluation (Signed)
Clinical/Bedside Swallow Evaluation Patient Details  Name: [name] MRN: [id number] Date of Birth: May 16, 1946  Today's Date: 06/02/2023 Time: SLP Start Time (ACUTE ONLY): 1105 SLP Stop Time (ACUTE ONLY): 1130 SLP Time Calculation (min) (ACUTE ONLY): 25 min  Past Medical History:  Past Medical History:  Diagnosis Date   Agatston coronary artery calcium score greater than 400    827 coronary calcium score which is 92nd percentile for age, race and sex matched controls   Anemia    Anxiety    Aortic atherosclerosis (HCC)    Arthritis    Backache, unspecified    Blood transfusion without reported diagnosis    Cataract    bil eyes   Depression    Hemiplegia, unspecified, affecting unspecified side    Lacunar infarction (HCC)    Other and unspecified hyperlipidemia    Polymyalgia (HCC)    Retinal defect, unspecified    tear of retina   Rotator cuff injury    right shoulder   Unspecified essential hypertension    Venous tributary (branch) occlusion of retina    Past Surgical History:  Past Surgical History:  Procedure Laterality Date   ABDOMINAL HYSTERECTOMY     CHOLECYSTECTOMY     COLONOSCOPY  01/11/2010   per Dr. Carman Ching, internal hemorrhoids only, repeat in 10 yrs    SPHINCTEROTOMY     UPPER GASTROINTESTINAL ENDOSCOPY     HPI:  Patient is a 77 y.o. female with PMH: CVA, HTN, HLD who presented to the hospital on 06/01/2023 after a ground-level fall on her right hip. She did hit her head but no LOC. She was found to have a right hip intertrochanteric fracture following a fall. She underwent an ORIF on 06/01/23. SLP ordered due to some concern regarding patient's swallowing.    Assessment / Plan / Recommendation  Clinical Impression  Patient is not presenting with clinical s/s of oropharyngeal dysphagia but is presenting with s/s of a potential esophageal dysphagia. She reported to SLP that she has been having swallowing difficulties for the past 4-5 years which  consist of globus sensation (points to sternum) with solid foods, getting "strangled" on liquids at times and having excessive saliva production resulting in her spitting out thick/foamy mucous. SLP assessed her swallow as she at small amount of her breakfast meal (piece of english muffin and sips of thin liquids). No overt s/s aspiration observed but patient did exhibit frequent belching and c/o globus sensation. She told SLP that in the past, she has felt better after belching and wonders if her swallowing difficulties are from "pressure" in her esophagus. SLP suspects that patient is sufferring from an esophageal dysphagia. She may benefit from a PPI as well as GI f/u. SLP Visit Diagnosis: Dysphagia, unspecified (R13.10)    Aspiration Risk  No limitations    Diet Recommendation Regular;Thin liquid    Liquid Administration via: Cup;Straw Medication Administration: Whole meds with liquid Supervision: Patient able to self feed Compensations: Slow rate;Small sips/bites Postural Changes: Seated upright at 90 degrees;Remain upright for at least 30 minutes after po intake    Other  Recommendations Recommended Consults: Consider GI evaluation;Consider esophageal assessment Oral Care Recommendations: Oral care BID    Recommendations for follow up therapy are one component of a multi-disciplinary discharge planning process, led by the attending physician.  Recommendations may be updated based on patient status, additional functional criteria and insurance authorization.  Follow up Recommendations No SLP follow up      Assistance Recommended at Discharge  N/A  Functional Status Assessment Patient has not had a recent decline in their functional status  Frequency and Duration     N/A       Prognosis   N/A     Swallow Study   General Date of Onset: 06/01/23 HPI: Patient is a 77 y.o. female with PMH: CVA, HTN, HLD who presented to the hospital on 06/01/2023 after a ground-level fall on her  right hip. She did hit her head but no LOC. She was found to have a right hip intertrochanteric fracture following a fall. She underwent an ORIF on 06/01/23. SLP ordered due to some concern regarding patient's swallowing. Type of Study: Bedside Swallow Evaluation Previous Swallow Assessment: EGD in 2023 with no significant findings Diet Prior to this Study: Regular;Thin liquids (Level 0) Temperature Spikes Noted: No Respiratory Status: Room air History of Recent Intubation: Yes Total duration of intubation (days):  (for surgery only) Behavior/Cognition: Alert;Cooperative;Pleasant mood Oral Cavity Assessment: Within Functional Limits Oral Care Completed by SLP: No Oral Cavity - Dentition: Adequate natural dentition Vision: Functional for self-feeding Self-Feeding Abilities: Able to feed self Patient Positioning: Upright in bed Baseline Vocal Quality: Normal Volitional Swallow: Able to elicit    Oral/Motor/Sensory Function Overall Oral Motor/Sensory Function: Within functional limits   Ice Chips     Thin Liquid Thin Liquid: Within functional limits Presentation: Straw;Self Fed    Nectar Thick     Honey Thick     Puree     Solid     Solid: Within functional limits Presentation: Self Fed      [name] Nevin, MA, CCC-SLP Speech Therapy

## 2023-06-02 NOTE — Progress Notes (Addendum)
PROGRESS NOTE   [name]  UEA:[id number]    DOB: August 02, 1945    DOA: 06/01/2023  PCP: Nelwyn Salisbury, MD   I have briefly reviewed patients previous medical records in [facility].  Chief Complaint  Patient presents with   [facility] Course:  77 year old female, lives with her family, ambulates with a walker, medical history significant for CVA, HTN, HLD, PMR who presented to the ED after a ground-level mechanical fall hard onto her right hip followed by right hip pain.  She hit her head on the chair, no LOC.  Admitted for comminuted right intertrochanteric femur fracture.  Orthopedics consulted and s/p IM nail 11/16.  Postop course complicated by acute blood loss anemia and acute urinary retention.   Assessment & Plan:  Principal Problem:   Closed intertrochanteric fracture of right hip, initial encounter [facility]) Active Problems:   Hypokalemia   Prolonged QT interval   Essential hypertension   Polymyalgia rheumatica (HCC)   CKD stage G2/A2, GFR 60-89 and albumin creatinine ratio 30-299 mg/g   Agatston coronary artery calcium score greater than 400   Right intertrochanteric fracture: Sustained after ground-level mechanical fall Orthopedics consulted and patient is s/p IM nail 11/16 Per orthopedic follow-up: NWB RLE, check and replace vitamin D levels as needed, on aspirin 81 Mg twice daily for postop DVT prophylaxis, therapies evaluation pending and may need SNF Multimodality pain control, minimize opioids Bowel regimen Delirium precautions  Acute blood loss anemia Hemoglobin dropped from 14.4 preop to 7.6 POD 1 is disproportionate to surgical blood loss/EBL 100 mL and the fracture. Repeat CBC at noon today to confirm if this degree of anemia is accurate.  It may be possible because patient did have a hematoma around the fracture site on CT. Follow CBC closely and transfuse if hemoglobin 7 g or less. No other evidence of overt  bleeding.  Thrombocytopenia Mild.  Likely related to acute blood loss. Follow CBC.  Acute urinary retention: Multifactorial due to pain, pain meds, immobility Mobilize, minimize opioids and encourage spontaneous voiding If unable then as needed In-N-Out catheterization.  Hypokalemia Replaced.  Replace magnesium  Hypomagnesemia Replace and follow.  Vitamin D insufficiency Vitamin D, 25-hydroxy: 18.48 Started vitamin D supplements 1000 units daily. Repeat vitamin D levels in 3 months and if continued to be low then may need to be on escalated dose.  Mechanical fall CT head and C-spine without acute findings PT and OT evaluation  Prolonged QT interval QTc on 11/16: 535 ms Replacing hypokalemia and hypomagnesemia Follow repeat EKG today. Minimize QT prolonging drugs. Not on telemetry this morning.    Essential hypertension Controlled. Continue Vasotec 20 mg daily.  Hold HCTZ for now.   Polymyalgia rheumatica (HCC) Followed by rheumatology, but on no medication  Uses walker to ambulate    CKD stage II Stable, continue to monitor   ?  Difficulty swallowing Documented by ED RN on admission night SLP consulted.   Agatston coronary artery calcium score greater than 400 Elevated coronary calcium score at 827 which is 92nd percentile for age, race and sex matched controls.  Patient has been referred to lipid clinic as patient was supposed to start Crestor but did not want to.  Was supposed to start aspirin 81 mg daily, but has not done this.  Outpatient follow-up.  Body mass index is 25.42 kg/m.   DVT prophylaxis: SCDs Start: 06/01/23 2049 SCDs Start: 06/01/23 1547 also per orthopedics, is on postop DVT prophylaxis with  aspirin 81 Mg twice daily.   Code Status: Full Code:  Family Communication: None at bedside Disposition:  Status is: Inpatient Remains inpatient appropriate because: Immediate postop, acute blood loss anemia needing close monitoring and management,  electrolyte abnormalities, therapies evaluation     Consultants:   Orthopedic/Dr. [name]  Procedures:   Right hip IM nail 11/16  Antimicrobials:      Subjective:  Right hip pain is better, worse with movements.  Overnight events noted, difficulty urinating and dysphagia  Objective:   Vitals:   06/01/23 2053 06/02/23 0034 06/02/23 0414 06/02/23 0710  BP: 130/65 125/62 (!) 118/53 135/67  Pulse: 86 95 93 95  Resp:  16 16   Temp: 97.7 F (36.5 C) 98.4 F (36.9 C) 98.6 F (37 C) 97.8 F (36.6 C)  TempSrc: Oral Oral Oral Oral  SpO2: 100% 96% 95% 98%  Weight:      Height:      Patient was interviewed and examined along with her female RN at bedside.  General exam: Elderly female, moderately built and nourished lying comfortably propped up in bed without distress. Respiratory system: Clear to auscultation. Respiratory effort normal. Cardiovascular system: S1 & S2 heard, RRR. No JVD, murmurs, rubs, gallops or clicks. No pedal edema.  Not on telemetry. Gastrointestinal system: Abdomen is nondistended, soft and nontender. No organomegaly or masses felt. Normal bowel sounds heard.  No better palpable. Central nervous system: Alert and oriented. No focal neurological deficits. Extremities: Symmetric 5 x 5 power.  Right hip surgical site dressing clean, dry and intact.  Restricted right lower extremity movements from pain. Skin: No rashes, lesions or ulcers Psychiatry: Judgement and insight appear normal. Mood & affect appears anxious.     Data Reviewed:   I have personally reviewed following labs and imaging studies   CBC: Recent Labs  Lab 06/01/23 1300 06/02/23 0600  WBC 10.3 7.4  NEUTROABS 8.5*  --   HGB 14.4 7.6*  HCT 44.4 23.7*  MCV 87.6 87.8  PLT 181 144*    Basic Metabolic Panel: Recent Labs  Lab 06/01/23 1300 06/01/23 1558 06/02/23 0600  NA 140  --  135  K 3.1*  --  3.7  CL 103  --  102  CO2 24  --  23  GLUCOSE 134*  --  146*  BUN 16  --  20   CREATININE 0.93  --  0.84  CALCIUM 9.4  --  8.2*  MG  --  1.6* 1.5*    Liver Function Tests: Recent Labs  Lab 06/01/23 1300  AST 26  ALT 14  ALKPHOS 61  BILITOT 1.1  PROT 7.4  ALBUMIN 3.7    CBG: No results for input(s): "GLUCAP" in the last 168 hours.  Microbiology Studies:   Recent Results (from the past 240 hour(s))  Surgical pcr screen     Status: Abnormal   Collection Time: 06/01/23  4:49 PM   Specimen: Nasal Mucosa; Nasal Swab  Result Value Ref Range Status   MRSA, PCR NEGATIVE NEGATIVE Final   Staphylococcus aureus POSITIVE (A) NEGATIVE Final    Comment: (NOTE) The Xpert SA Assay (FDA approved for NASAL specimens in patients 61 years of age and older), is one component of a comprehensive surveillance program. It is not intended to diagnose infection nor to guide or monitor treatment. Performed at [facility], [address], [place], [place] [address]     Radiology Studies:  DG HIP UNILAT WITH PELVIS 2-3 VIEWS RIGHT  Result Date: 06/02/2023 CLINICAL DATA:  Follow up ORIF. EXAM: DG HIP (WITH OR WITHOUT PELVIS) 3V RIGHT COMPARISON:  06/01/2023. FINDINGS: Patient is status post ORIF right femoral neck fracture with grossly anatomic alignment. There is bilateral hip degenerative change with joint space narrowing and small osteophytes. Pelvic ring is intact. No acute fracture, dislocation or subluxation. No osteolytic or osteoblastic lesions. IMPRESSION: Grossly anatomic alignment status post ORIF. Bilateral hip degenerative changes. Electronically Signed   By: Layla Maw M.D.   On: 06/02/2023 09:42   DG FEMUR, MIN 2 VIEWS RIGHT  Result Date: 06/01/2023 CLINICAL DATA:  Elective surgery. EXAM: RIGHT FEMUR 2 VIEWS COMPARISON:  Radiograph earlier today. FINDINGS: Six fluoroscopic spot views of the right femur obtained in the operating room. Femoral intramedullary nail with trans trochanteric and distal locking screw fixation traversing comminuted proximal  femur fracture. Fluoroscopy time 1 minutes 4 seconds. Dose 6.73 mGy. IMPRESSION: Intraoperative fluoroscopy during proximal femur fracture fixation. Electronically Signed   By: Narda Rutherford M.D.   On: 06/01/2023 19:10   DG C-Arm 1-60 Min-No Report  Result Date: 06/01/2023 Fluoroscopy was utilized by the requesting physician.  No radiographic interpretation.   DG C-Arm 1-60 Min-No Report  Result Date: 06/01/2023 Fluoroscopy was utilized by the requesting physician.  No radiographic interpretation.   DG Femur Min 2 Views Right  Result Date: 06/01/2023 CLINICAL DATA:  Fall, right hip pain EXAM: RIGHT FEMUR 2 VIEWS; PELVIS - 1-2 VIEW COMPARISON:  09/25/2021 FINDINGS: Acute, comminuted intertrochanteric fracture of the proximal right femur with 90 degrees of varus angulation. There is shortening of the femoral shaft. Lesser trochanteric fragment is mildly displaced. Hip joint alignment is maintained without dislocation. No pelvic diastasis. No lytic or sclerotic bone lesion identified. IMPRESSION: Acute, comminuted intertrochanteric fracture of the proximal right femur with varus angulation. Electronically Signed   By: Duanne Guess D.O.   On: 06/01/2023 15:14   DG Pelvis 1-2 Views  Result Date: 06/01/2023 CLINICAL DATA:  Fall, right hip pain EXAM: RIGHT FEMUR 2 VIEWS; PELVIS - 1-2 VIEW COMPARISON:  09/25/2021 FINDINGS: Acute, comminuted intertrochanteric fracture of the proximal right femur with 90 degrees of varus angulation. There is shortening of the femoral shaft. Lesser trochanteric fragment is mildly displaced. Hip joint alignment is maintained without dislocation. No pelvic diastasis. No lytic or sclerotic bone lesion identified. IMPRESSION: Acute, comminuted intertrochanteric fracture of the proximal right femur with varus angulation. Electronically Signed   By: Duanne Guess D.O.   On: 06/01/2023 15:14   DG Chest 1 View  Result Date: 06/01/2023 CLINICAL DATA:  Status post  fall. EXAM: CHEST  1 VIEW COMPARISON:  September 25, 2021 FINDINGS: The heart size and mediastinal contours are within normal limits. Both lungs are clear. The visualized skeletal structures are unremarkable. Calcific atherosclerotic disease of the aorta. IMPRESSION: No active disease. Electronically Signed   By: Ted Mcalpine M.D.   On: 06/01/2023 15:13   CT PELVIS WO CONTRAST  Result Date: 06/01/2023 CLINICAL DATA:  Fall with right thigh pain EXAM: CT PELVIS WITHOUT CONTRAST TECHNIQUE: Multidetector CT imaging of the pelvis was performed following the standard protocol without intravenous contrast. RADIATION DOSE REDUCTION: This exam was performed according to the departmental dose-optimization program which includes automated exposure control, adjustment of the mA and/or kV according to patient size and/or use of iterative reconstruction technique. COMPARISON:  Same-day pelvis radiograph FINDINGS: Urinary Tract: No focal bladder wall thickening. Bowel: Partially imaged bowel without bowel wall thickening, distention, or inflammatory changes. Colonic diverticulosis without  acute diverticulitis. Normal appendix. Vascular/Lymphatic: Atherosclerosis. No enlarged abdominal or pelvic lymph nodes. Reproductive: No adnexal masses. Other: No free fluid, fluid collection, or free air. Musculoskeletal: Comminuted right intertrochanteric fracture with varus angulation. Asymmetric expansion of the musculature surrounding the right greater trochanter with increased surrounding soft tissue density. The right femoroacetabular joint is intact. Degenerative changes of the bilateral hips. IMPRESSION: 1. Comminuted right intertrochanteric femur fracture with varus angulation. 2. Asymmetric expansion of the musculature surrounding the right greater trochanter with increased surrounding soft tissue density, likely hematoma. Electronically Signed   By: Agustin Cree M.D.   On: 06/01/2023 15:01   CT Head Wo Contrast  Result Date:  06/01/2023 CLINICAL DATA:  Head trauma, minor (Age >= 65y); Neck trauma (Age >= 65y). Lost balance and fell in the kitchen, a hitting the right side of the head on a chair. EXAM: CT HEAD WITHOUT CONTRAST CT CERVICAL SPINE WITHOUT CONTRAST TECHNIQUE: Multidetector CT imaging of the head and cervical spine was performed following the standard protocol without intravenous contrast. Multiplanar CT image reconstructions of the cervical spine were also generated. RADIATION DOSE REDUCTION: This exam was performed according to the departmental dose-optimization program which includes automated exposure control, adjustment of the mA and/or kV according to patient size and/or use of iterative reconstruction technique. COMPARISON:  CT head 09/10/2022 FINDINGS: CT HEAD FINDINGS Brain: There is no evidence of an acute infarct, intracranial hemorrhage, mass, midline shift, or extra-axial fluid collection. The ventricles and sulci are within normal limits for age. Cerebral white matter hypodensities are similar to the prior CT and are nonspecific but compatible with mild chronic small vessel ischemic disease. A small chronic left pontine infarct is again noted. Vascular: Calcified atherosclerosis at the skull base. No hyperdense vessel. Skull: No acute fracture or suspicious osseous lesion. Sinuses/Orbits: Minimal mucosal thickening in the paranasal sinuses. Clear mastoid air cells. Bilateral cataract extraction. Other: Small right parietal scalp hematoma. CT CERVICAL SPINE FINDINGS Alignment: Mild reversal of the normal cervical lordosis. Trace anterolisthesis of C4 on C5 and C7 on T1 and trace retrolisthesis of C5 on C6 and C6 on C7. Skull base and vertebrae: Congenital vertebral body and posterior element fusion at C3-4. No acute fracture or suspicious osseous lesion. Asymmetric left-sided C1-2 arthropathy. Soft tissues and spinal canal: No prevertebral fluid or swelling. No visible canal hematoma. Disc levels: Advanced disc  degeneration at C5-6 and C6-7. Advanced facet arthrosis on the left at C2-3 and C4-5. Mild spinal stenosis and severe right neural foraminal stenosis at C5-6. Upper chest: Clear lung apices. Other: Moderate calcific atherosclerosis at the right greater than left carotid bifurcations. IMPRESSION: 1. No evidence of acute intracranial abnormality or cervical spine fracture. 2. Small right parietal scalp hematoma. Electronically Signed   By: Sebastian Ache M.D.   On: 06/01/2023 14:10   CT Cervical Spine Wo Contrast  Result Date: 06/01/2023 CLINICAL DATA:  Head trauma, minor (Age >= 65y); Neck trauma (Age >= 65y). Lost balance and fell in the kitchen, a hitting the right side of the head on a chair. EXAM: CT HEAD WITHOUT CONTRAST CT CERVICAL SPINE WITHOUT CONTRAST TECHNIQUE: Multidetector CT imaging of the head and cervical spine was performed following the standard protocol without intravenous contrast. Multiplanar CT image reconstructions of the cervical spine were also generated. RADIATION DOSE REDUCTION: This exam was performed according to the departmental dose-optimization program which includes automated exposure control, adjustment of the mA and/or kV according to patient size and/or use of iterative reconstruction technique. COMPARISON:  CT  head 09/10/2022 FINDINGS: CT HEAD FINDINGS Brain: There is no evidence of an acute infarct, intracranial hemorrhage, mass, midline shift, or extra-axial fluid collection. The ventricles and sulci are within normal limits for age. Cerebral white matter hypodensities are similar to the prior CT and are nonspecific but compatible with mild chronic small vessel ischemic disease. A small chronic left pontine infarct is again noted. Vascular: Calcified atherosclerosis at the skull base. No hyperdense vessel. Skull: No acute fracture or suspicious osseous lesion. Sinuses/Orbits: Minimal mucosal thickening in the paranasal sinuses. Clear mastoid air cells. Bilateral cataract  extraction. Other: Small right parietal scalp hematoma. CT CERVICAL SPINE FINDINGS Alignment: Mild reversal of the normal cervical lordosis. Trace anterolisthesis of C4 on C5 and C7 on T1 and trace retrolisthesis of C5 on C6 and C6 on C7. Skull base and vertebrae: Congenital vertebral body and posterior element fusion at C3-4. No acute fracture or suspicious osseous lesion. Asymmetric left-sided C1-2 arthropathy. Soft tissues and spinal canal: No prevertebral fluid or swelling. No visible canal hematoma. Disc levels: Advanced disc degeneration at C5-6 and C6-7. Advanced facet arthrosis on the left at C2-3 and C4-5. Mild spinal stenosis and severe right neural foraminal stenosis at C5-6. Upper chest: Clear lung apices. Other: Moderate calcific atherosclerosis at the right greater than left carotid bifurcations. IMPRESSION: 1. No evidence of acute intracranial abnormality or cervical spine fracture. 2. Small right parietal scalp hematoma. Electronically Signed   By: Sebastian Ache M.D.   On: 06/01/2023 14:10    Scheduled Meds:    acetaminophen  1,000 mg Oral Q6H   aspirin  81 mg Oral BID   Chlorhexidine Gluconate Cloth  6 each Topical Q0600   docusate sodium  100 mg Oral BID   enalapril  20 mg Oral Daily   [START ON 06/03/2023] hydrochlorothiazide  25 mg Oral QODAY   mupirocin ointment  1 Application Nasal BID   Vitamin D (Ergocalciferol)  50,000 Units Oral Q7 days    Continuous Infusions:    dextrose 5 % and 0.9 % NaCl 40 mL/hr at 06/01/23 2239     LOS: 1 day     Marcellus Scott, MD,  FACP, [facility], [facility], [facility]   [facility] [place]      To contact the attending provider between 7A-7P or the covering provider during after hours 7P-7A, please log into the web site [email or web address] and access using universal [place] password for that web site. If you do not have the password, please call the hospital operator.  06/02/2023, 9:58 AM

## 2023-06-03 DIAGNOSIS — G8918 Other acute postprocedural pain: Secondary | ICD-10-CM | POA: Diagnosis not present

## 2023-06-03 DIAGNOSIS — D696 Thrombocytopenia, unspecified: Secondary | ICD-10-CM

## 2023-06-03 DIAGNOSIS — D62 Acute posthemorrhagic anemia: Secondary | ICD-10-CM | POA: Diagnosis not present

## 2023-06-03 LAB — CBC
HCT: 21.3 % — ABNORMAL LOW (ref 36.0–46.0)
Hemoglobin: 7.1 g/dL — ABNORMAL LOW (ref 12.0–15.0)
MCH: 29.6 pg (ref 26.0–34.0)
MCHC: 33.3 g/dL (ref 30.0–36.0)
MCV: 88.8 fL (ref 80.0–100.0)
Platelets: 136 10*3/uL — ABNORMAL LOW (ref 150–400)
RBC: 2.4 MIL/uL — ABNORMAL LOW (ref 3.87–5.11)
RDW: 14.9 % (ref 11.5–15.5)
WBC: 9.4 10*3/uL (ref 4.0–10.5)
nRBC: 0 % (ref 0.0–0.2)

## 2023-06-03 LAB — BASIC METABOLIC PANEL
Anion gap: 8 (ref 5–15)
BUN: 25 mg/dL — ABNORMAL HIGH (ref 8–23)
CO2: 25 mmol/L (ref 22–32)
Calcium: 8.5 mg/dL — ABNORMAL LOW (ref 8.9–10.3)
Chloride: 103 mmol/L (ref 98–111)
Creatinine, Ser: 0.82 mg/dL (ref 0.44–1.00)
GFR, Estimated: >60 mL/min (ref >60)
Glucose, Bld: 107 mg/dL — ABNORMAL HIGH (ref 70–99)
Potassium: 3.6 mmol/L (ref 3.5–5.1)
Sodium: 136 mmol/L (ref 135–145)

## 2023-06-03 LAB — HEMOGLOBIN AND HEMATOCRIT, BLOOD
HCT: 24.7 % — ABNORMAL LOW (ref 36.0–46.0)
Hemoglobin: 8.3 g/dL — ABNORMAL LOW (ref 12.0–15.0)

## 2023-06-03 LAB — MAGNESIUM: Magnesium: 2 mg/dL (ref 1.7–2.4)

## 2023-06-03 LAB — ABO/RH: ABO/RH(D): O NEG

## 2023-06-03 LAB — PREPARE RBC (CROSSMATCH)

## 2023-06-03 MED ORDER — ENSURE ENLIVE PO LIQD
237.0000 mL | Freq: Two times a day (BID) | ORAL | Status: DC
  Administered 2023-06-03 – 2023-06-04 (×2): 237 mL via ORAL

## 2023-06-03 MED ORDER — SODIUM CHLORIDE 0.9% IV SOLUTION: Freq: Once | INTRAVENOUS | Status: AC

## 2023-06-03 NOTE — Progress Notes (Signed)
Initial Nutrition Assessment  DOCUMENTATION CODES:   Not applicable  INTERVENTION:   -Change diet texture to dysphagia III per patient preference in effort to improve intakes.  -Provide Ensure Enlive po BID, each supplement provides 350 kcal and 20 grams of protein.    NUTRITION DIAGNOSIS:   Inadequate oral intake related to other (see comment) (possibly decreased appetite secondary to pain, lethargic, complaint of swallow difficulty) as evidenced by meal completion < 25%.    GOAL:   Patient will meet greater than or equal to 90% of their needs    MONITOR:   PO intake, Supplement acceptance, Skin, Labs, Weight trends  REASON FOR ASSESSMENT:   Consult Assessment of nutrition requirement/status  ASSESSMENT: 77 y/o female presented to the ED after a fall. Uses a walker to ambulate at baseline. Found to have a closed intertrochanteric fracture of right hip. PMH: CVA, HLD, PMR, HTN, coronary artery calcium score in 92%ile for age, race and sex, anemia, anxiety, aortic atherosclerosis, depression, hemiplegia, lacunar infarction, polymyalgia, venous branch occlusion of retina, cholecystectomy.  11/16-s/p IM nail.   Patient appears quite sleepy and lethargic-possibly due to medication as she has experienced pain per EMR. Answers questions with brief statements. Denies any change in diet at home, eats one meal daily, she did not provide details. . Notes some difficulty swallowing, SLP cleared for regular diet 11/17.  B tray on shelf appeared barely consumed. Patient agrees to mechanical soft meal textures. Accepting of Ensure.  Per EMR, no significant weight change past 24 months identified.  Edema non-pitting to right lower extremity.  Medications reviewed and include colace, PPI, vitamin D 1.25 mg every 7 days.  Labs reviewed    NUTRITION - FOCUSED PHYSICAL EXAM:  Flowsheet Row Most Recent Value  Orbital Region No depletion  Upper Arm Region No depletion  Thoracic and  Lumbar Region No depletion  Buccal Region Mild depletion  [note may be usual for this patient.]  Temple Region Mild depletion  Clavicle Bone Region Mild depletion  Clavicle and Acromion Bone Region Mild depletion  Scapular Bone Region Unable to assess  [patient would not reposition.]  Dorsal Hand Mild depletion  Patellar Region No depletion  Anterior Thigh Region No depletion  Posterior Calf Region No depletion  Edema (RD Assessment) None  Hair Reviewed  Eyes Reviewed  Mouth Reviewed  Skin Reviewed  Nails Reviewed       Diet Order:   Diet Order             Diet regular Room service appropriate? Yes; Fluid consistency: Thin  Diet effective now                   EDUCATION NEEDS:   Not appropriate for education at this time  Skin:  Skin Assessment: Skin Integrity Issues: Skin Integrity Issues:: Incisions Incisions: R hip  Last BM:  05/31/23  Height:   Ht Readings from Last 1 Encounters:  06/01/23 5\' 2"  (1.575 m)    Weight:   Wt Readings from Last 1 Encounters:  06/01/23 63 kg    Ideal Body Weight:  52.3 kg  BMI:  Body mass index is 25.42 kg/m.  Estimated Nutritional Needs:   Kcal:  1350-1600 kcal/day  Protein:  68-78 gm/day  Fluid:  1500-1600 mL/day    Alvino Chapel, RDLD Clinical Dietitian See AMION for contact information

## 2023-06-03 NOTE — Evaluation (Signed)
Occupational Therapy Evaluation Patient Details Name: [name] MRN: [id number] DOB: Apr 09, 1946 Today's Date: 06/03/2023   History of Present Illness Pt is a 77 y.o. female presents to [facility] 06/01/23 after losing her balance and falling. Diagnosed w/ R intertrochanteric fx, s/p IM nail 11/16. PMHx: anemia, anxiety, aortic atherosclerosis, arthritis, hemiplegia unspecified, polymyalgia, R rotator cuff injury, HTN   Clinical Impression   PTA patient reports independent ADLs, mobility using RW.  Admitted for above and presents with problem list below.  Pt requires max assist for bed mobility, min assist to min guard for sitting balance EOB, minA to total assist for Adls.  Limited activity tolerance, requesting to lay back down after sitting for EOB for a few minutes.  Pt oriented and follows simple commands with increased time, but demonstrating decreased attention and problem solving throughout session. Based on performance today, believe patient will best benefit from continued OT services acutely and after dc at inpatient setting with <3hrs/day to optimize independence, safety with ADLs and mobility.      If plan is discharge home, recommend the following: Two people to help with walking and/or transfers;Two people to help with bathing/dressing/bathroom    Functional Status Assessment  Patient has had a recent decline in their functional status and demonstrates the ability to make significant improvements in function in a reasonable and predictable amount of time.  Equipment Recommendations  Other (comment) (defer)    Recommendations for Other Services       Precautions / Restrictions Precautions Precautions: Fall Restrictions Weight Bearing Restrictions: Yes RLE Weight Bearing: Non weight bearing      Mobility Bed Mobility Overal bed mobility: Needs Assistance Bed Mobility: Rolling, Sidelying to Sit, Sit to Sidelying Rolling: Max assist Sidelying to sit: Max assist      Sit to sidelying: Max assist General bed mobility comments: pt assists minimally in all parts but overall requires max assist to fully ascend to EOB    Transfers                   General transfer comment: deferred      Balance Overall balance assessment: Needs assistance Sitting-balance support: No upper extremity supported, Feet supported Sitting balance-Leahy Scale: Fair Sitting balance - Comments: min guard statically, preference to at least 1 UE support                                   ADL either performed or assessed with clinical judgement   ADL Overall ADL's : Needs assistance/impaired     Grooming: Sitting;Minimal assistance           Upper Body Dressing : Minimal assistance;Sitting   Lower Body Dressing: Total assistance;Sitting/lateral leans;Bed level     Toilet Transfer Details (indicate cue type and reason): deferred         Functional mobility during ADLs: Maximal assistance       Vision   Additional Comments: NT     Perception         Praxis         Pertinent Vitals/Pain Pain Assessment Pain Assessment: Faces Faces Pain Scale: Hurts whole lot Pain Location: R hip Pain Descriptors / Indicators: Aching, Grimacing, Discomfort Pain Intervention(s): Limited activity within patient's tolerance, Monitored during session, Repositioned     Extremity/Trunk Assessment Upper Extremity Assessment Upper Extremity Assessment: Generalized weakness   Lower Extremity Assessment Lower Extremity Assessment: Defer to PT evaluation  Cervical / Trunk Assessment Cervical / Trunk Assessment: Kyphotic   Communication Communication Communication: No apparent difficulties Cueing Techniques: Verbal cues   Cognition Arousal: Alert Behavior During Therapy: WFL for tasks assessed/performed, Anxious Overall Cognitive Status: No family/caregiver present to determine baseline cognitive functioning Area of Impairment: Attention,  Memory, Following commands, Safety/judgement, Awareness, Problem solving                   Current Attention Level: Focused Memory: Decreased recall of precautions, Decreased short-term memory Following Commands: Follows one step commands inconsistently, Follows one step commands with increased time Safety/Judgement: Decreased awareness of safety, Decreased awareness of deficits Awareness: Emergent Problem Solving: Slow processing, Difficulty sequencing, Requires verbal cues, Decreased initiation General Comments: Patient oriented and following some simple commands with repetition.  She has slow processing and decreased problem sovling.     General Comments       Exercises     Shoulder Instructions      Home Living Family/patient expects to be discharged to:: Private residence Living Arrangements: Children;Spouse/significant other Available Help at Discharge: Family;Available 24 hours/day Type of Home: House Home Access: Stairs to enter [employer] of Steps: 1 Entrance Stairs-Rails: None       Bathroom Shower/Tub: Producer, television/film/video: Standard     Home Equipment: Agricultural consultant (2 wheels);Cane - single point;Shower seat          Prior Functioning/Environment Prior Level of Function : Independent/Modified Independent;Driving;History of Falls (last six months)             Mobility Comments: Uses cane or RW for mobility. Has been having difficulty walking longer distance. Fell while not using AD, "foot caught". Fall 6 months prior with "foot caught" ADLs Comments: independent with ADLs, managing meds, light IADLs        OT Problem List: Decreased strength;Decreased activity tolerance;Impaired balance (sitting and/or standing);Pain;Decreased knowledge of precautions;Decreased knowledge of use of DME or AE;Decreased safety awareness;Decreased cognition      OT Treatment/Interventions: Self-care/ADL training;Therapeutic exercise;DME  and/or AE instruction;Therapeutic activities;Cognitive remediation/compensation;Balance training;Patient/family education    OT Goals(Current goals can be found in the care plan section) Acute Rehab OT Goals Patient Stated Goal: none stated OT Goal Formulation: Patient unable to participate in goal setting Time For Goal Achievement: 06/17/23 Potential to Achieve Goals: Fair  OT Frequency: Min 1X/week    Co-evaluation              AM-PAC OT "6 Clicks" Daily Activity     Outcome Measure Help from another person eating meals?: A Little Help from another person taking care of personal grooming?: A Little Help from another person toileting, which includes using toliet, bedpan, or urinal?: Total Help from another person bathing (including washing, rinsing, drying)?: A Lot Help from another person to put on and taking off regular upper body clothing?: A Little Help from another person to put on and taking off regular lower body clothing?: Total 6 Click Score: 13   End of Session Nurse Communication: Mobility status;Patient requests pain meds  Activity Tolerance: Patient limited by fatigue;Patient limited by pain Patient left: in bed;with call bell/phone within reach;with bed alarm set  OT Visit Diagnosis: Other abnormalities of gait and mobility (R26.89);Muscle weakness (generalized) (M62.81);History of falling (Z91.81);Pain;Other symptoms and signs involving cognitive function Pain - Right/Left: Right Pain - part of body: Hip;Leg                Time: 9604-5409 OT Time Calculation (min): 18 min  Charges:  OT General Charges $OT Visit: 1 Visit OT Evaluation $OT Eval Moderate Complexity: 1 Mod  Barry Brunner, OT Acute Rehabilitation Services Office [phone]   Chancy Milroy 06/03/2023, 11:02 AM

## 2023-06-03 NOTE — Progress Notes (Addendum)
PROGRESS NOTE   CATERIA FLEEGLE  VWU:[id number]    DOB: November 01, 1945    DOA: 06/01/2023  PCP: Nelwyn Salisbury, MD   I have briefly reviewed patients previous medical records in [facility].  Chief Complaint  Patient presents with   [facility] Course:  77 year old female, lives with her family, ambulates with a walker, medical history significant for CVA, HTN, HLD, PMR who presented to the ED after a ground-level mechanical fall hard onto her right hip followed by right hip pain.  She hit her head on the chair, no LOC.  Admitted for comminuted right intertrochanteric femur fracture.  Orthopedics consulted and s/p IM nail 11/16.  Postop course complicated by acute blood loss anemia and acute urinary retention.   Assessment & Plan:  Principal Problem:   Closed intertrochanteric fracture of right hip, initial encounter [facility]) Active Problems:   Hypokalemia   Prolonged QT interval   Essential hypertension   Polymyalgia rheumatica (HCC)   CKD stage G2/A2, GFR 60-89 and albumin creatinine ratio 30-299 mg/g   Agatston coronary artery calcium score greater than 400   Right intertrochanteric fracture: Sustained after ground-level mechanical fall Orthopedics consulted and patient is s/p IM nail 11/16 Per orthopedic follow-up: NWB RLE, check and replace vitamin D levels as needed, on aspirin 81 Mg twice daily for postop DVT prophylaxis, therapies evaluation pending and may need SNF Multimodality pain control, minimize opioids Bowel regimen Delirium precautions PT has seen and recommended SNF.  Acute blood loss anemia Hemoglobin dropped from 14.4 preop to 7.6 POD 1 is disproportionate to surgical blood loss/EBL 100 mL and the fracture. It may be possible because patient did have a hematoma around the fracture site on CT. also has a frontal scalp hematoma. Hemoglobin stable in the mid 7 g range, this morning labs pending. Follow CBC closely and transfuse if hemoglobin 7 g or  less. No other evidence of overt bleeding.  Addendum: Hemoglobin down to 7.1.  Patient complaining of being quite weak.  Agrees to a unit of PRBC transfusion.  Thrombocytopenia Mild.  Likely related to acute blood loss. Follow CBC.  This morning labs pending.  Acute urinary retention: Multifactorial due to pain, pain meds, immobility Mobilize, minimize opioids and encourage spontaneous voiding If unable then as needed In-N-Out catheterization. Appears to be voiding spontaneously.  Hypokalemia Replaced.  Replace magnesium.  Follow labs.  Hypomagnesemia Replace and follow.  Follow labs.  Vitamin D insufficiency Vitamin D, 25-hydroxy: 18.48 Ortho PDX have started vitamin D 50,000 units q. weekly.  Probably at discharge could do 1000 units daily Repeat vitamin D levels in 3 months and if continued to be low then may need to be on escalated dose.  Mechanical fall CT head and C-spine without acute findings PT and OT evaluation.  As above  Prolonged QT interval QTc on 11/16: 535 ms Replacing hypokalemia and hypomagnesemia Repeat EKG 11/17: QTc 427. Minimize QT prolonging drugs. Resolved.   Essential hypertension Controlled. Continue Vasotec 20 mg daily.  Hold HCTZ for now.   Polymyalgia rheumatica (HCC) Followed by rheumatology, but on no medication  Uses walker to ambulate    CKD stage II Stable, continue to monitor   ?  Difficulty swallowing Documented by ED RN on admission night Per communication with SLP, initiated PPI and outpatient follow-up.  GI.   Agatston coronary artery calcium score greater than 400 Elevated coronary calcium score at 827 which is 92nd percentile for age, race and sex matched  controls.  Patient has been referred to lipid clinic as patient was supposed to start Crestor but did not want to.  Was supposed to start aspirin 81 mg daily, but has not done this.  Outpatient follow-up.  Body mass index is 25.42 kg/m.   DVT prophylaxis: SCDs  Start: 06/01/23 2049 SCDs Start: 06/01/23 1547 also per orthopedics, is on postop DVT prophylaxis with aspirin 81 Mg twice daily.   Code Status: Full Code:  Family Communication: Spouse and daughter via phone. Disposition:  Status is: Inpatient Postop pain needing bladder management and further therapy evaluation.     Consultants:   Orthopedic/Dr. [name]  Procedures:   Right hip IM nail 11/16  Antimicrobials:      Subjective:  Reports bilateral lower extremity pain and spasms.  Got p.o. meds at around 5:30 AM.  May need IV meds.  Communicated with RN.  Objective:   Vitals:   06/02/23 1515 06/02/23 2036 06/03/23 0509 06/03/23 0749  BP: (!) 142/75 (!) 150/54 (!) 139/59 (!) 135/54  Pulse: 94 95 (!) 105 (!) 103  Resp:  18 14   Temp: 98.3 F (36.8 C)  98 F (36.7 C) 98 F (36.7 C)  TempSrc: Oral   Oral  SpO2: 99% 98% 95% 93%  Weight:      Height:        General exam: Elderly female, moderately built and thinly nourished lying uncomfortably propped up in bed in some painful distress.  Untouched breakfast tray at bedside. Respiratory system: Clear to auscultation. Respiratory effort normal. Cardiovascular system: S1 & S2 heard, RRR. No JVD, murmurs, rubs, gallops or clicks. No pedal edema.  Not on telemetry. Gastrointestinal system: Abdomen is nondistended, soft and nontender. No organomegaly or masses felt. Normal bowel sounds heard.  No better palpable. Central nervous system: Alert and oriented. No focal neurological deficits. Extremities: Symmetric 5 x 5 power.  Right hip surgical site dressing clean, dry and intact, unchanged and stable.  Restricted right lower extremity movements from pain.  No acute findings in bilateral lower extremities, neurovascular bundle intact. Skin: No rashes, lesions or ulcers Psychiatry: Judgement and insight appear normal. Mood & affect, in pain.     Data Reviewed:   I have personally reviewed following labs and imaging  studies   CBC: Recent Labs  Lab 06/01/23 1300 06/02/23 0600 06/02/23 1133 06/03/23 0759  WBC 10.3 7.4 10.2 9.4  NEUTROABS 8.5*  --   --   --   HGB 14.4 7.6* 7.9* 7.1*  HCT 44.4 23.7* 23.5* 21.3*  MCV 87.6 87.8 88.3 88.8  PLT 181 144* 143* 136*    Basic Metabolic Panel: Recent Labs  Lab 06/01/23 1300 06/01/23 1558 06/02/23 0600 06/03/23 0759  NA 140  --  135 136  K 3.1*  --  3.7 3.6  CL 103  --  102 103  CO2 24  --  23 25  GLUCOSE 134*  --  146* 107*  BUN 16  --  20 25*  CREATININE 0.93  --  0.84 0.82  CALCIUM 9.4  --  8.2* 8.5*  MG  --  1.6* 1.5* 2.0    Liver Function Tests: Recent Labs  Lab 06/01/23 1300  AST 26  ALT 14  ALKPHOS 61  BILITOT 1.1  PROT 7.4  ALBUMIN 3.7    CBG: No results for input(s): "GLUCAP" in the last 168 hours.  Microbiology Studies:   Recent Results (from the past 240 hour(s))  Surgical pcr screen  Status: Abnormal   Collection Time: 06/01/23  4:49 PM   Specimen: Nasal Mucosa; Nasal Swab  Result Value Ref Range Status   MRSA, PCR NEGATIVE NEGATIVE Final   Staphylococcus aureus POSITIVE (A) NEGATIVE Final    Comment: (NOTE) The Xpert SA Assay (FDA approved for NASAL specimens in patients 49 years of age and older), is one component of a comprehensive surveillance program. It is not intended to diagnose infection nor to guide or monitor treatment. Performed at [facility], [address], [place], [place] [address]     Radiology Studies:  DG HIP UNILAT WITH PELVIS 2-3 VIEWS RIGHT  Result Date: 06/02/2023 CLINICAL DATA:  Follow up ORIF. EXAM: DG HIP (WITH OR WITHOUT PELVIS) 3V RIGHT COMPARISON:  06/01/2023. FINDINGS: Patient is status post ORIF right femoral neck fracture with grossly anatomic alignment. There is bilateral hip degenerative change with joint space narrowing and small osteophytes. Pelvic ring is intact. No acute fracture, dislocation or subluxation. No osteolytic or osteoblastic lesions. IMPRESSION:  Grossly anatomic alignment status post ORIF. Bilateral hip degenerative changes. Electronically Signed   By: Layla Maw M.D.   On: 06/02/2023 09:42   DG FEMUR, MIN 2 VIEWS RIGHT  Result Date: 06/01/2023 CLINICAL DATA:  Elective surgery. EXAM: RIGHT FEMUR 2 VIEWS COMPARISON:  Radiograph earlier today. FINDINGS: Six fluoroscopic spot views of the right femur obtained in the operating room. Femoral intramedullary nail with trans trochanteric and distal locking screw fixation traversing comminuted proximal femur fracture. Fluoroscopy time 1 minutes 4 seconds. Dose 6.73 mGy. IMPRESSION: Intraoperative fluoroscopy during proximal femur fracture fixation. Electronically Signed   By: Narda Rutherford M.D.   On: 06/01/2023 19:10   DG C-Arm 1-60 Min-No Report  Result Date: 06/01/2023 Fluoroscopy was utilized by the requesting physician.  No radiographic interpretation.   DG C-Arm 1-60 Min-No Report  Result Date: 06/01/2023 Fluoroscopy was utilized by the requesting physician.  No radiographic interpretation.    Scheduled Meds:    acetaminophen  1,000 mg Oral Q6H   aspirin  81 mg Oral BID   Chlorhexidine Gluconate Cloth  6 each Topical Q0600   docusate sodium  100 mg Oral BID   enalapril  20 mg Oral Daily   feeding supplement  237 mL Oral BID BM   mupirocin ointment  1 Application Nasal BID   pantoprazole  40 mg Oral Daily   Vitamin D (Ergocalciferol)  50,000 Units Oral Q7 days    Continuous Infusions:       LOS: 2 days     Marcellus Scott, MD,  FACP, [facility], [facility], [facility]   [facility] [place]      To contact the attending provider between 7A-7P or the covering provider during after hours 7P-7A, please log into the web site [email or web address] and access using universal [place] password for that web site. If you do not have the password, please call the hospital operator.  06/03/2023, 2:33 PM

## 2023-06-03 NOTE — Progress Notes (Signed)
  Subjective: [name] is a 77 y.o. female s/p right hip IM nail.  They are POD 2.  Pt's pain is moderate but controlled.  No chest pain or shortness of breath.  She does feel fairly fatigued.  Hospitalist team monitoring hemoglobin with consideration for transfusion if this drops below 7.0..    Objective: Vital signs in last 24 hours: Temp:  [98 F (36.7 C)-98.3 F (36.8 C)] 98 F (36.7 C) (11/18 0749) Pulse Rate:  [94-105] 103 (11/18 0749) Resp:  [14-18] 14 (11/18 0509) BP: (135-150)/(54-75) 135/54 (11/18 0749) SpO2:  [93 %-99 %] 93 % (11/18 0749)  Intake/Output from previous day: 11/17 0701 - 11/18 0700 In: 240 [P.O.:240] Out: 700 [Urine:700] Intake/Output this shift: Total I/O In: -  Out: 400 [Urine:400]  Exam:  No gross blood or drainage overlying the dressing 2+ DP pulse Sensation intact distally in the right foot Able to dorsiflex and plantarflex the right foot No calf tenderness.  Negative Homans' sign bilaterally.   Labs: Recent Labs    06/01/23 1300 06/02/23 0600 06/02/23 1133 06/03/23 0759  HGB 14.4 7.6* 7.9* 7.1*   Recent Labs    06/02/23 1133 06/03/23 0759  WBC 10.2 9.4  RBC 2.66* 2.40*  HCT 23.5* 21.3*  PLT 143* 136*   Recent Labs    06/02/23 0600 06/03/23 0759  NA 135 136  K 3.7 3.6  CL 102 103  CO2 23 25  BUN 20 25*  CREATININE 0.84 0.82  GLUCOSE 146* 107*  CALCIUM 8.2* 8.5*   Recent Labs    06/01/23 1558  INR 1.1    Assessment/Plan: Pt is POD 2 s/p right hip IM nail.    -Disposition pending medical team clearance and decision  -Nonweightbearing to right lower extremity  -DVT Prophylaxis: Aspirin twice daily and SCDs  -Plan for follow-up in clinic 2 to 3 weeks postop for radiographic check and initiation of weightbearing around 3 weeks postop  -Spoke with patient today and clarified weightbearing status     [employer] 06/03/2023, 11:05 AM

## 2023-06-03 NOTE — Plan of Care (Signed)
  Problem: Health Behavior/Discharge Planning: Goal: Ability to manage health-related needs will improve Outcome: Progressing   Problem: Activity: Goal: Risk for activity intolerance will decrease Outcome: Progressing   Problem: Elimination: Goal: Will not experience complications related to urinary retention Outcome: Progressing

## 2023-06-03 NOTE — Plan of Care (Signed)
  Problem: Education: Goal: Knowledge of General Education information will improve Description Including pain rating scale, medication(s)/side effects and non-pharmacologic comfort measures Outcome: Progressing   

## 2023-06-04 ENCOUNTER — Encounter (HOSPITAL_COMMUNITY): Payer: Self-pay | Admitting: Orthopedic Surgery

## 2023-06-04 DIAGNOSIS — D62 Acute posthemorrhagic anemia: Secondary | ICD-10-CM | POA: Diagnosis not present

## 2023-06-04 DIAGNOSIS — S72144D Nondisplaced intertrochanteric fracture of right femur, subsequent encounter for closed fracture with routine healing: Secondary | ICD-10-CM | POA: Diagnosis not present

## 2023-06-04 LAB — BPAM RBC
Blood Product Expiration Date: 202411212359
ISSUE DATE / TIME: 202411181625
Unit Type and Rh: 9500

## 2023-06-04 LAB — CBC
HCT: 25 % — ABNORMAL LOW (ref 36.0–46.0)
Hemoglobin: 8.3 g/dL — ABNORMAL LOW (ref 12.0–15.0)
MCH: 28.7 pg (ref 26.0–34.0)
MCHC: 33.2 g/dL (ref 30.0–36.0)
MCV: 86.5 fL (ref 80.0–100.0)
Platelets: 150 10*3/uL (ref 150–400)
RBC: 2.89 MIL/uL — ABNORMAL LOW (ref 3.87–5.11)
RDW: 16 % — ABNORMAL HIGH (ref 11.5–15.5)
WBC: 7.6 10*3/uL (ref 4.0–10.5)
nRBC: 0 % (ref 0.0–0.2)

## 2023-06-04 LAB — TYPE AND SCREEN
ABO/RH(D): O NEG
Antibody Screen: NEGATIVE
Unit division: 0

## 2023-06-04 MED ORDER — VITAMIN D (ERGOCALCIFEROL) 1.25 MG (50000 UNIT) PO CAPS: 50000.0000 [IU] | ORAL_CAPSULE | ORAL | Status: AC

## 2023-06-04 MED ORDER — OXYCODONE HCL 5 MG PO TABS: 5.0000 mg | ORAL_TABLET | Freq: Three times a day (TID) | ORAL | 0 refills | Status: DC | PRN

## 2023-06-04 MED ORDER — PANTOPRAZOLE SODIUM 40 MG PO TBEC: 40.0000 mg | DELAYED_RELEASE_TABLET | Freq: Every day | ORAL | Status: DC

## 2023-06-04 MED ORDER — ACETAMINOPHEN 325 MG PO TABS: 650.0000 mg | ORAL_TABLET | Freq: Four times a day (QID) | ORAL | Status: AC | PRN

## 2023-06-04 MED ORDER — HYDROCHLOROTHIAZIDE 25 MG PO TABS
25.0000 mg | ORAL_TABLET | ORAL | Status: DC
  Administered 2023-06-04: 25 mg via ORAL
  Filled 2023-06-04: qty 1

## 2023-06-04 MED ORDER — HYDRALAZINE HCL 20 MG/ML IJ SOLN: 10.0000 mg | Freq: Four times a day (QID) | INTRAMUSCULAR | Status: DC | PRN

## 2023-06-04 MED ORDER — DOCUSATE SODIUM 100 MG PO CAPS: 100.0000 mg | ORAL_CAPSULE | Freq: Two times a day (BID) | ORAL | Status: DC

## 2023-06-04 MED ORDER — ASPIRIN 81 MG PO CHEW: 81.0000 mg | CHEWABLE_TABLET | Freq: Two times a day (BID) | ORAL | Status: AC

## 2023-06-04 NOTE — Care Management Important Message (Signed)
Important Message  Patient Details  Name: [name] MRN: [id number] Date of Birth: 07-27-1945   Important Message Given:  Yes - Medicare IM     Sherilyn Banker 06/04/2023, 2:03 PM

## 2023-06-04 NOTE — Discharge Planning (Signed)
Patient alert and oriented. Discharge teaching / hand off report given to [place] at [facility] rehab. Dishcarge summary placed in discharge packet along with written prescription written by discharge provider. Patient will be transported via [facility].

## 2023-06-04 NOTE — [place] FL2 (Signed)
MEDICAID FL2 LEVEL OF CARE FORM     IDENTIFICATION  Patient Name: [name] Birthdate: 03/18/1946 Sex: female Admission Date (Current Location): 06/01/2023  [facility] and [place] Number:  Producer, television/film/video and Address:  [facility] [place], [address] [place], [place] [address]      Provider Number: [id number]  Attending Physician Name and Address:  Elease Etienne, MD  Relative Name and Phone Number:  Nixon, Rulli [phone]  [phone]    Current Level of Care: Hospital Recommended Level of Care: Skilled Nursing Facility Prior Approval Number:    Date Approved/Denied:   PASRR Number: [id number]  Discharge Plan: SNF    Current Diagnoses: Patient Active Problem List   Diagnosis Date Noted   Prolonged QT interval 06/01/2023   Closed intertrochanteric fracture of right hip, initial encounter (HCC) 06/01/2023   Agatston coronary artery calcium score greater than 400 12/30/2021   Dysphagia 10/04/2021   Aortic atherosclerosis (HCC) 10/04/2021   Lesion of tonsil 03/15/2020   AKI (acute kidney injury) (HCC) 03/06/2020   CKD stage G2/A2, GFR 60-89 and albumin creatinine ratio 30-299 mg/g 03/06/2020   Hypomagnesemia 03/06/2020   Polymyalgia rheumatica (HCC) 03/06/2020   Hypercalcemia 03/04/2020   Confusion 03/02/2020   Memory loss 03/02/2020   B12 deficiency 01/09/2019   Vertigo 10/31/2018   Hypokalemia 10/31/2018   Chest pain 10/30/2018   Carpal tunnel syndrome, bilateral 08/11/2018   Cerebral artery occlusion with cerebral infarction (HCC) 11/22/2009   BACK PAIN, CHRONIC 11/22/2009   UNSPECIFIED RETINAL DEFECT 11/08/2009   ROTATOR CUFF INJURY, RIGHT SHOULDER 11/08/2009   BRANCH RETINAL VEIN OCCLUSION 08/12/2007   Essential hypertension 08/12/2007    Orientation RESPIRATION BLADDER Height & Weight     Self, Time, Situation, Place  Normal Incontinent Weight: 139 lb (63 kg) Height:  5\' 2"  (157.5 cm)   BEHAVIORAL SYMPTOMS/MOOD NEUROLOGICAL BOWEL NUTRITION STATUS      Continent Diet (see discharge summary)  AMBULATORY STATUS COMMUNICATION OF NEEDS Skin   Total Care Verbally Surgical wounds, Other (Comment) (ecchymosis)                       Personal Care Assistance Level of Assistance  Bathing, Feeding, Dressing, Total care Bathing Assistance: Maximum assistance Feeding assistance: Limited assistance Dressing Assistance: Maximum assistance Total Care Assistance: Maximum assistance   Functional Limitations Info  Sight, Hearing, Speech Sight Info: Adequate Hearing Info: Adequate Speech Info: Adequate    SPECIAL CARE FACTORS FREQUENCY  PT (By licensed PT), OT (By licensed OT)     PT Frequency: 5x week OT Frequency: 5x week            Contractures Contractures Info: Not present    Additional Factors Info  Code Status, Allergies Code Status Info: full Allergies Info: : Amoxicillin, Meloxicam           Current Medications (06/04/2023):  This is the current hospital active medication list Current Facility-Administered Medications  Medication Dose Route Frequency Provider Last Rate Last Admin   acetaminophen (TYLENOL) tablet 325-650 mg  325-650 mg Oral Q6H PRN Cammy Copa, MD   650 mg at 06/02/23 0809   aspirin chewable tablet 81 mg  81 mg Oral BID Cammy Copa, MD   81 mg at 06/04/23 1022   Chlorhexidine Gluconate Cloth 2 % PADS 6 each  6 each Topical Q0600 Orland Mustard, MD   6 each at 06/04/23 0504   docusate sodium (COLACE) capsule  100 mg  100 mg Oral BID Cammy Copa, MD   100 mg at 06/04/23 1022   enalapril (VASOTEC) tablet 20 mg  20 mg Oral Daily Orland Mustard, MD   20 mg at 06/04/23 1023   feeding supplement (ENSURE ENLIVE / ENSURE PLUS) liquid 237 mL  237 mL Oral BID BM Hongalgi, Anand D, MD   237 mL at 06/04/23 1022   HYDROmorphone (DILAUDID) injection 0.5 mg  0.5 mg Intravenous Q4H PRN Cammy Copa, MD   0.5 mg at 06/02/23  2043   menthol-cetylpyridinium (CEPACOL) lozenge 3 mg  1 lozenge Oral PRN Cammy Copa, MD       Or   phenol (CHLORASEPTIC) mouth spray 1 spray  1 spray Mouth/Throat PRN Cammy Copa, MD       methocarbamol (ROBAXIN) tablet 500 mg  500 mg Oral Q6H PRN Cammy Copa, MD   500 mg at 06/03/23 1847   Or   methocarbamol (ROBAXIN) injection 500 mg  500 mg Intravenous Q6H PRN Cammy Copa, MD       mupirocin ointment (BACTROBAN) 2 % 1 Application  1 Application Nasal BID Orland Mustard, MD   1 Application at 06/04/23 1022   oxyCODONE (Oxy IR/ROXICODONE) immediate release tablet 5 mg  5 mg Oral Q6H PRN Cammy Copa, MD   5 mg at 06/03/23 0518   pantoprazole (PROTONIX) EC tablet 40 mg  40 mg Oral Daily Marcellus Scott D, MD   40 mg at 06/04/23 1022   Vitamin D (Ergocalciferol) (DRISDOL) 1.25 MG (50000 UNIT) capsule 50,000 Units  50,000 Units Oral Q7 days Julieanne Cotton, PA-C   50,000 Units at 06/01/23 2252     Discharge Medications: Please see discharge summary for a list of discharge medications.  Relevant Imaging Results:  Relevant Lab Results:   Additional Information SSN: [id number]  Lorri Frederick, LCSW

## 2023-06-04 NOTE — TOC Transition Note (Signed)
Transition of Care [facility]) - CM/SW Discharge Note   Patient Details  Name: [name] MRN: [id number] Date of Birth: 1945/11/08  Transition of Care [facility] CM/SW Contact:  Lorri Frederick, LCSW Phone Number: 06/04/2023, 4:06 PM   Clinical Narrative:   Pt discharging to [facility].  RN call report to [phone].     Final next level of care: Skilled Nursing Facility Barriers to Discharge: Barriers Resolved   Patient Goals and CMS Choice CMS Medicare.gov Compare Post Acute Care list provided to:: Patient Choice offered to / list presented to : Patient  Discharge Placement                Patient chooses bed at: [facility] Patient to be transferred to facility by: ptar Name of family member notified: daughter Lynnea Ferrier Patient and family notified of of transfer: 06/04/23  Discharge Plan and Services Additional resources added to the After Visit Summary for   In-house Referral: Clinical Social Work   Post Acute Care Choice: Skilled Nursing Facility                               Social Determinants of Health (SDOH) Interventions SDOH Screenings   Food Insecurity: No Food Insecurity (06/01/2023)  Housing: Low Risk  (06/01/2023)  Transportation Needs: No Transportation Needs (06/01/2023)  Utilities: Not At Risk (06/01/2023)  Alcohol Screen: Low Risk  (02/13/2023)  Depression (PHQ2-9): Low Risk  (02/13/2023)  Financial Resource Strain: Low Risk  (02/13/2023)  Physical Activity: Inactive (02/13/2023)  Social Connections: Socially Integrated (02/13/2023)  Stress: No Stress Concern Present (02/13/2023)  Tobacco Use: Low Risk  (06/01/2023)  Health Literacy: Adequate Health Literacy (02/13/2023)     Readmission Risk Interventions     No data to display

## 2023-06-04 NOTE — Discharge Instructions (Signed)

## 2023-06-04 NOTE — Discharge Summary (Signed)
Physician Discharge Summary  [name] LKG:[id number] DOB: 02/23/1946  PCP: Elizabeth Salisbury, MD  Admitted from: Home Discharged to: SNF  Admit date: 06/01/2023 Discharge date: 06/04/2023  Recommendations for Outpatient Follow-up:    Contact information for follow-up providers     MD at SNF. Schedule an appointment as soon as possible for a visit.   Why: To be seen with repeat labs in 2 days (CBC & BMP).        Elizabeth Salisbury, MD. Schedule an appointment as soon as possible for a visit.   Specialty: Family Medicine Why: To be seen upon discharge from SNF. Contact information: [address] [name] [address] [phone]         Elizabeth Copa, MD. Schedule an appointment as soon as possible for a visit in 2 week(s).   Specialty: Orthopedic Surgery Why: Postop follow-up. Contact information: [address] [place] [address] [phone]              Contact information for after-discharge care     Destination     [facility] Preferred SNF .   Service: Skilled Nursing Contact information: [address] [place] [address] [phone]                      Home Health: None    Equipment/Devices: TBD at [facility]    Discharge Condition: Improved and stable.   Code Status: Full Code Diet recommendation:  Discharge Diet Orders (From admission, onward)     Start     Ordered   06/04/23 0000  Diet - low sodium heart healthy       Comments: Diet consistency per speech therapy evaluation: Dysphagia 3 diet and thin liquids.   06/04/23 1551             Discharge Diagnoses:  Principal Problem:   Closed intertrochanteric fracture of right hip, initial encounter [name]) Active Problems:   Hypokalemia   Prolonged QT interval   Essential hypertension   Polymyalgia rheumatica (HCC)   CKD stage G2/A2, GFR 60-89 and albumin creatinine ratio 30-299 mg/g   Agatston coronary  artery calcium score greater than 400   Brief Summary: 77 year old female, lives with her family, ambulates with a walker, medical history significant for CVA, HTN, HLD, PMR who presented to the ED after a ground-level mechanical fall hard onto her right hip followed by right hip pain.  She hit her head on the chair, no LOC.  Admitted for comminuted right intertrochanteric femur fracture.  Orthopedics consulted and s/p IM nail 11/16.  Postop course complicated by acute blood loss anemia and acute urinary retention.     Assessment & Plan:    Right intertrochanteric fracture: Sustained after ground-level mechanical fall Orthopedics consulted and patient is s/p IM nail 11/16 Per orthopedic follow-up: NWB RLE, replacing vitamin D insufficiency, on aspirin 81 Mg twice daily x 30 days for postop DVT prophylaxis, therapies evaluated and recommend SNF Multimodality pain control, minimize opioids.  Has used very little of pain meds. Bowel regimen Delirium precautions Communicated with orthopedic APP, Harriette Bouillon, PA-C who has cleared patient for discharge to SNF with above recommendations.   Acute blood loss anemia Hemoglobin dropped from 14.4 preop to 7.6 POD 1 is disproportionate to surgical blood loss/EBL 100 mL and the fracture. It may be possible because patient did have a hematoma around the fracture site on CT. also has a frontal scalp hematoma. Hemoglobin down to 7.1  on 11/18.  S/p 1 unit PRBC transfusion and hemoglobin up to 8.3 last night followed by 8.3 this morning. No other overt bleeding noted. Follow CBC closely at SNF, next draw should be on 11/21.   Thrombocytopenia Mild.  Likely related to acute blood loss. Resolved.   Acute urinary retention: Multifactorial due to pain, pain meds, immobility Mobilize, minimize opioids and encourage spontaneous voiding If unable then as needed In-N-Out catheterization. Resolved.   Hypokalemia Replaced   Hypomagnesemia Replaced    Vitamin D insufficiency Vitamin D, 25-hydroxy: 18.48 Orthopedics have started vitamin D 50,000 units q. weekly.   Repeat vitamin D levels in 3 months   Mechanical fall CT head and C-spine without acute findings PT and OT evaluation.  As above   Prolonged QT interval QTc on 11/16: 535 ms Replacing hypokalemia and hypomagnesemia Repeat EKG 11/17: QTc 427. Minimize QT prolonging drugs. Resolved.   Essential hypertension Mildly uncontrolled today.  Continued home dose of Vasotec 20 Mg daily and resumed home dose of HCTZ 20 mg every other day.  Monitor at SNF.   Polymyalgia rheumatica (HCC) Followed by rheumatology, but on no medication  Uses walker to ambulate    CKD stage II Stable, continue to monitor    ?  Difficulty swallowing Documented by ED RN on admission night Per communication with SLP, initiated PPI and outpatient follow-up with GI.  Recommend outpatient GI consultation for further evaluation and management   Agatston coronary artery calcium score greater than 400 Elevated coronary calcium score at 827 which is 92nd percentile for age, race and sex matched controls.  Patient has been referred to lipid clinic as patient was supposed to start Crestor but did not want to.  Was supposed to start aspirin 81 mg daily, but has not done this.  Outpatient follow-up.   Body mass index is 25.42 kg/m.      Consultants:   Orthopedic/Dr. [name]   Procedures:   Right hip IM nail 11/16    Discharge Instructions  Discharge Instructions     Call MD for:  difficulty breathing, headache or visual disturbances   Complete by: As directed    Call MD for:  extreme fatigue   Complete by: As directed    Call MD for:  persistant dizziness or light-headedness   Complete by: As directed    Call MD for:  persistant nausea and vomiting   Complete by: As directed    Call MD for:  redness, tenderness, or signs of infection (pain, swelling, redness, odor or green/yellow discharge  around incision site)   Complete by: As directed    Call MD for:  severe uncontrolled pain   Complete by: As directed    Call MD for:  temperature >100.4   Complete by: As directed    Diet - low sodium heart healthy   Complete by: As directed    Diet consistency per speech therapy evaluation: Dysphagia 3 diet and thin liquids.   Discharge instructions   Complete by: As directed    Per orthopedic follow-up: NWB RLE   Increase activity slowly   Complete by: As directed         Medication List     TAKE these medications    acetaminophen 325 MG tablet Commonly known as: TYLENOL Take 2 tablets (650 mg total) by mouth every 6 (six) hours as needed for mild pain (pain score 1-3) or fever (pain score 1-3 or temp > 100.5).   aspirin 81 MG chewable tablet Chew  1 tablet (81 mg total) by mouth 2 (two) times daily.   docusate sodium 100 MG capsule Commonly known as: COLACE Take 1 capsule (100 mg total) by mouth 2 (two) times daily.   enalapril 20 MG tablet Commonly known as: VASOTEC TAKE 1 TABLET BY MOUTH EVERY DAY   hydrochlorothiazide 25 MG tablet Commonly known as: HYDRODIURIL TAKE 1 TABLET BY MOUTH EVERY DAY   oxyCODONE 5 MG immediate release tablet Commonly known as: Oxy IR/ROXICODONE Take 1 tablet (5 mg total) by mouth every 8 (eight) hours as needed for severe pain (pain score 7-10) or moderate pain (pain score 4-6) (pain score 4-6).   pantoprazole 40 MG tablet Commonly known as: PROTONIX Take 1 tablet (40 mg total) by mouth daily. Start taking on: June 05, 2023   Vitamin D (Ergocalciferol) 1.25 MG (50000 UNIT) Caps capsule Commonly known as: DRISDOL Take 1 capsule (50,000 Units total) by mouth every 7 (seven) days for 3 doses. Start taking on: June 08, 2023       Allergies  Allergen Reactions   Amoxicillin Nausea Only   Meloxicam Other (See Comments)    Muscle weakness       Procedures/Studies: DG HIP UNILAT WITH PELVIS 2-3 VIEWS RIGHT  Result  Date: 06/02/2023 CLINICAL DATA:  Follow up ORIF. EXAM: DG HIP (WITH OR WITHOUT PELVIS) 3V RIGHT COMPARISON:  06/01/2023. FINDINGS: Patient is status post ORIF right femoral neck fracture with grossly anatomic alignment. There is bilateral hip degenerative change with joint space narrowing and small osteophytes. Pelvic ring is intact. No acute fracture, dislocation or subluxation. No osteolytic or osteoblastic lesions. IMPRESSION: Grossly anatomic alignment status post ORIF. Bilateral hip degenerative changes. Electronically Signed   By: Layla Maw M.D.   On: 06/02/2023 09:42   DG FEMUR, MIN 2 VIEWS RIGHT  Result Date: 06/01/2023 CLINICAL DATA:  Elective surgery. EXAM: RIGHT FEMUR 2 VIEWS COMPARISON:  Radiograph earlier today. FINDINGS: Six fluoroscopic spot views of the right femur obtained in the operating room. Femoral intramedullary nail with trans trochanteric and distal locking screw fixation traversing comminuted proximal femur fracture. Fluoroscopy time 1 minutes 4 seconds. Dose 6.73 mGy. IMPRESSION: Intraoperative fluoroscopy during proximal femur fracture fixation. Electronically Signed   By: Narda Rutherford M.D.   On: 06/01/2023 19:10   DG C-Arm 1-60 Min-No Report  Result Date: 06/01/2023 Fluoroscopy was utilized by the requesting physician.  No radiographic interpretation.   DG C-Arm 1-60 Min-No Report  Result Date: 06/01/2023 Fluoroscopy was utilized by the requesting physician.  No radiographic interpretation.   DG Femur Min 2 Views Right  Result Date: 06/01/2023 CLINICAL DATA:  Fall, right hip pain EXAM: RIGHT FEMUR 2 VIEWS; PELVIS - 1-2 VIEW COMPARISON:  09/25/2021 FINDINGS: Acute, comminuted intertrochanteric fracture of the proximal right femur with 90 degrees of varus angulation. There is shortening of the femoral shaft. Lesser trochanteric fragment is mildly displaced. Hip joint alignment is maintained without dislocation. No pelvic diastasis. No lytic or sclerotic bone  lesion identified. IMPRESSION: Acute, comminuted intertrochanteric fracture of the proximal right femur with varus angulation. Electronically Signed   By: Duanne Guess D.O.   On: 06/01/2023 15:14   DG Pelvis 1-2 Views  Result Date: 06/01/2023 CLINICAL DATA:  Fall, right hip pain EXAM: RIGHT FEMUR 2 VIEWS; PELVIS - 1-2 VIEW COMPARISON:  09/25/2021 FINDINGS: Acute, comminuted intertrochanteric fracture of the proximal right femur with 90 degrees of varus angulation. There is shortening of the femoral shaft. Lesser trochanteric fragment is mildly displaced. Hip joint alignment  is maintained without dislocation. No pelvic diastasis. No lytic or sclerotic bone lesion identified. IMPRESSION: Acute, comminuted intertrochanteric fracture of the proximal right femur with varus angulation. Electronically Signed   By: Duanne Guess D.O.   On: 06/01/2023 15:14   DG Chest 1 View  Result Date: 06/01/2023 CLINICAL DATA:  Status post fall. EXAM: CHEST  1 VIEW COMPARISON:  September 25, 2021 FINDINGS: The heart size and mediastinal contours are within normal limits. Both lungs are clear. The visualized skeletal structures are unremarkable. Calcific atherosclerotic disease of the aorta. IMPRESSION: No active disease. Electronically Signed   By: Ted Mcalpine M.D.   On: 06/01/2023 15:13   CT PELVIS WO CONTRAST  Result Date: 06/01/2023 CLINICAL DATA:  Fall with right thigh pain EXAM: CT PELVIS WITHOUT CONTRAST TECHNIQUE: Multidetector CT imaging of the pelvis was performed following the standard protocol without intravenous contrast. RADIATION DOSE REDUCTION: This exam was performed according to the departmental dose-optimization program which includes automated exposure control, adjustment of the mA and/or kV according to patient size and/or use of iterative reconstruction technique. COMPARISON:  Same-day pelvis radiograph FINDINGS: Urinary Tract: No focal bladder wall thickening. Bowel: Partially imaged bowel  without bowel wall thickening, distention, or inflammatory changes. Colonic diverticulosis without acute diverticulitis. Normal appendix. Vascular/Lymphatic: Atherosclerosis. No enlarged abdominal or pelvic lymph nodes. Reproductive: No adnexal masses. Other: No free fluid, fluid collection, or free air. Musculoskeletal: Comminuted right intertrochanteric fracture with varus angulation. Asymmetric expansion of the musculature surrounding the right greater trochanter with increased surrounding soft tissue density. The right femoroacetabular joint is intact. Degenerative changes of the bilateral hips. IMPRESSION: 1. Comminuted right intertrochanteric femur fracture with varus angulation. 2. Asymmetric expansion of the musculature surrounding the right greater trochanter with increased surrounding soft tissue density, likely hematoma. Electronically Signed   By: Agustin Cree M.D.   On: 06/01/2023 15:01   CT Head Wo Contrast  Result Date: 06/01/2023 CLINICAL DATA:  Head trauma, minor (Age >= 65y); Neck trauma (Age >= 65y). Lost balance and fell in the kitchen, a hitting the right side of the head on a chair. EXAM: CT HEAD WITHOUT CONTRAST CT CERVICAL SPINE WITHOUT CONTRAST TECHNIQUE: Multidetector CT imaging of the head and cervical spine was performed following the standard protocol without intravenous contrast. Multiplanar CT image reconstructions of the cervical spine were also generated. RADIATION DOSE REDUCTION: This exam was performed according to the departmental dose-optimization program which includes automated exposure control, adjustment of the mA and/or kV according to patient size and/or use of iterative reconstruction technique. COMPARISON:  CT head 09/10/2022 FINDINGS: CT HEAD FINDINGS Brain: There is no evidence of an acute infarct, intracranial hemorrhage, mass, midline shift, or extra-axial fluid collection. The ventricles and sulci are within normal limits for age. Cerebral white matter  hypodensities are similar to the prior CT and are nonspecific but compatible with mild chronic small vessel ischemic disease. A small chronic left pontine infarct is again noted. Vascular: Calcified atherosclerosis at the skull base. No hyperdense vessel. Skull: No acute fracture or suspicious osseous lesion. Sinuses/Orbits: Minimal mucosal thickening in the paranasal sinuses. Clear mastoid air cells. Bilateral cataract extraction. Other: Small right parietal scalp hematoma. CT CERVICAL SPINE FINDINGS Alignment: Mild reversal of the normal cervical lordosis. Trace anterolisthesis of C4 on C5 and C7 on T1 and trace retrolisthesis of C5 on C6 and C6 on C7. Skull base and vertebrae: Congenital vertebral body and posterior element fusion at C3-4. No acute fracture or suspicious osseous lesion. Asymmetric left-sided C1-2 arthropathy.  Soft tissues and spinal canal: No prevertebral fluid or swelling. No visible canal hematoma. Disc levels: Advanced disc degeneration at C5-6 and C6-7. Advanced facet arthrosis on the left at C2-3 and C4-5. Mild spinal stenosis and severe right neural foraminal stenosis at C5-6. Upper chest: Clear lung apices. Other: Moderate calcific atherosclerosis at the right greater than left carotid bifurcations. IMPRESSION: 1. No evidence of acute intracranial abnormality or cervical spine fracture. 2. Small right parietal scalp hematoma. Electronically Signed   By: Sebastian Ache M.D.   On: 06/01/2023 14:10   CT Cervical Spine Wo Contrast  Result Date: 06/01/2023 CLINICAL DATA:  Head trauma, minor (Age >= 65y); Neck trauma (Age >= 65y). Lost balance and fell in the kitchen, a hitting the right side of the head on a chair. EXAM: CT HEAD WITHOUT CONTRAST CT CERVICAL SPINE WITHOUT CONTRAST TECHNIQUE: Multidetector CT imaging of the head and cervical spine was performed following the standard protocol without intravenous contrast. Multiplanar CT image reconstructions of the cervical spine were also  generated. RADIATION DOSE REDUCTION: This exam was performed according to the departmental dose-optimization program which includes automated exposure control, adjustment of the mA and/or kV according to patient size and/or use of iterative reconstruction technique. COMPARISON:  CT head 09/10/2022 FINDINGS: CT HEAD FINDINGS Brain: There is no evidence of an acute infarct, intracranial hemorrhage, mass, midline shift, or extra-axial fluid collection. The ventricles and sulci are within normal limits for age. Cerebral white matter hypodensities are similar to the prior CT and are nonspecific but compatible with mild chronic small vessel ischemic disease. A small chronic left pontine infarct is again noted. Vascular: Calcified atherosclerosis at the skull base. No hyperdense vessel. Skull: No acute fracture or suspicious osseous lesion. Sinuses/Orbits: Minimal mucosal thickening in the paranasal sinuses. Clear mastoid air cells. Bilateral cataract extraction. Other: Small right parietal scalp hematoma. CT CERVICAL SPINE FINDINGS Alignment: Mild reversal of the normal cervical lordosis. Trace anterolisthesis of C4 on C5 and C7 on T1 and trace retrolisthesis of C5 on C6 and C6 on C7. Skull base and vertebrae: Congenital vertebral body and posterior element fusion at C3-4. No acute fracture or suspicious osseous lesion. Asymmetric left-sided C1-2 arthropathy. Soft tissues and spinal canal: No prevertebral fluid or swelling. No visible canal hematoma. Disc levels: Advanced disc degeneration at C5-6 and C6-7. Advanced facet arthrosis on the left at C2-3 and C4-5. Mild spinal stenosis and severe right neural foraminal stenosis at C5-6. Upper chest: Clear lung apices. Other: Moderate calcific atherosclerosis at the right greater than left carotid bifurcations. IMPRESSION: 1. No evidence of acute intracranial abnormality or cervical spine fracture. 2. Small right parietal scalp hematoma. Electronically Signed   By: Sebastian Ache M.D.   On: 06/01/2023 14:10      Subjective: Seen this morning.  Looks and feels much better.  Weakness has improved but not completely resolved.  Pain controlled on meds.  States that we should be communicating with her daughter because her spouse has dementia.  Discharge Exam:  Vitals:   06/04/23 0411 06/04/23 0815 06/04/23 1404 06/04/23 1425  BP: (!) 156/72 (!) 167/85 (!) 184/80 (!) 170/76  Pulse: 93 90 (!) 113   Resp:  18 18   Temp: 98.4 F (36.9 C) 99.3 F (37.4 C) 99 F (37.2 C)   TempSrc: Oral Oral Oral   SpO2: 96% 98% 97%   Weight:      Height:        General exam: Elderly female, moderately built and thinly  nourished lying comfortably propped up in bed.  Looks much improved compared to yesterday.  Appears to be in good spirits.  Smiling. Respiratory system: Clear to auscultation.  Clear to auscultation. Cardiovascular system: S1 & S2 heard, RRR. No JVD, murmurs, rubs, gallops or clicks. No pedal edema.  Not on telemetry. Gastrointestinal system: Abdomen is nondistended, soft and nontender. No organomegaly or masses felt. Normal bowel sounds heard.   Central nervous system: Alert and oriented. No focal neurological deficits. Extremities: Symmetric 5 x 5 power.  Right hip surgical site dressing clean, dry and intact, remains unchanged and stable without acute findings.  Restricted right lower extremity movements from pain.   Skin: No rashes, lesions or ulcers Psychiatry: Judgement and insight appear normal. Mood & affect pleasant and appropriate    The results of significant diagnostics from this hospitalization (including imaging, microbiology, ancillary and laboratory) are listed below for reference.     Microbiology: Recent Results (from the past 240 hour(s))  Surgical pcr screen     Status: Abnormal   Collection Time: 06/01/23  4:49 PM   Specimen: Nasal Mucosa; Nasal Swab  Result Value Ref Range Status   MRSA, PCR NEGATIVE NEGATIVE Final   Staphylococcus  aureus POSITIVE (A) NEGATIVE Final    Comment: (NOTE) The Xpert SA Assay (FDA approved for NASAL specimens in patients 46 years of age and older), is one component of a comprehensive surveillance program. It is not intended to diagnose infection nor to guide or monitor treatment. Performed at [facility], [address] [place], [place] [address]      Labs: CBC: Recent Labs  Lab 06/01/23 1300 06/02/23 0600 06/02/23 1133 06/03/23 0759 06/03/23 2126 06/04/23 0724  WBC 10.3 7.4 10.2 9.4  --  7.6  NEUTROABS 8.5*  --   --   --   --   --   HGB 14.4 7.6* 7.9* 7.1* 8.3* 8.3*  HCT 44.4 23.7* 23.5* 21.3* 24.7* 25.0*  MCV 87.6 87.8 88.3 88.8  --  86.5  PLT 181 144* 143* 136*  --  150    Basic Metabolic Panel: Recent Labs  Lab 06/01/23 1300 06/01/23 1558 06/02/23 0600 06/03/23 0759  NA 140  --  135 136  K 3.1*  --  3.7 3.6  CL 103  --  102 103  CO2 24  --  23 25  GLUCOSE 134*  --  146* 107*  BUN 16  --  20 25*  CREATININE 0.93  --  0.84 0.82  CALCIUM 9.4  --  8.2* 8.5*  MG  --  1.6* 1.5* 2.0    Liver Function Tests: Recent Labs  Lab 06/01/23 1300  AST 26  ALT 14  ALKPHOS 61  BILITOT 1.1  PROT 7.4  ALBUMIN 3.7   Discussed in detail with patient's daughter via phone, updated care and answered all questions.  Time coordinating discharge: 35 minutes  SIGNED:  Marcellus Scott, MD,  FACP, [facility], [facility], [facility]   [facility] Advisor [place]     To contact the attending provider between 7A-7P or the covering provider during after hours 7P-7A, please log into the web site [email or web address] and access using universal [place] password for that web site. If you do not have the password, please call the hospital operator.

## 2023-06-04 NOTE — TOC Initial Note (Addendum)
Transition of Care [facility]) - Initial/Assessment Note    Patient Details  Name: [name] MRN: [id number] Date of Birth: [birth year]  Transition of Care [facility]) CM/SW Contact:    Lorri Frederick, LCSW Phone Number: 06/04/2023, 10:32 AM  Clinical Narrative:   CSW met with pt regarding PT recommendations for SNF.  Pt is agreeable to SNF, medicare choice document provided, permission given to send out referral on hub.  Pt from home with husband Rayna Sexton, daughter Lorina Rabon, permission given to speak with them.  No current services.  Referral sent out in hub.                 1210: CSW attempted to call husband, left message.  CSW spoke with pt daughter Lynnea Ferrier, she is on her way to hospital, will review offers when she gets here.   1315: CSW met with pt, daughter Lynnea Ferrier and granddaughter, provided bed offers.  They are requesting response from [facility] and [facility].  CSW reached out to those facilities.  1535: Kerri calls back, wants to accept offer at [facility].  CSW confirmed with Rhonda[facility] that they can receive pt today.  Auth request submitted in [place] and approved: [id number], 7 days: 11/19-11/25.  MD informed.    Expected Discharge Plan: Skilled Nursing Facility Barriers to Discharge: Continued Medical Work up, SNF Pending bed offer   Patient Goals and CMS Choice Patient states their goals for this hospitalization and ongoing recovery are:: able to do normal activities CMS Medicare.gov Compare Post Acute Care list provided to:: Patient Choice offered to / list presented to : Patient      Expected Discharge Plan and Services In-house Referral: Clinical Social Work   Post Acute Care Choice: Skilled Nursing Facility Living arrangements for the past 2 months: Single Family Home                                      Prior Living Arrangements/Services Living arrangements for the past 2 months: Single Family Home Lives with:: Spouse, Adult Children (with husband  and daughter) Patient language and need for interpreter reviewed:: Yes Do you feel safe going back to the place where you live?: Yes      Need for Family Participation in Patient Care: Yes (Comment) Care giver support system in place?: Yes (comment) Current home services: Other (comment) (none) Criminal Activity/Legal Involvement Pertinent to Current Situation/Hospitalization: No - Comment as needed  Activities of Daily Living   ADL Screening (condition at time of admission) Independently performs ADLs?: Yes (appropriate for developmental age) Is the patient deaf or have difficulty hearing?: Yes Does the patient have difficulty seeing, even when wearing glasses/contacts?: No Does the patient have difficulty concentrating, remembering, or making decisions?: Yes  Permission Sought/Granted Permission sought to share information with : Family Supports Permission granted to share information with : Yes, Verbal Permission Granted  Share Information with NAME: husband Rayna Sexton, daughter Lorina Rabon  Permission granted to share info w AGENCY: SNF        Emotional Assessment Appearance:: Appears stated age Attitude/Demeanor/Rapport: Engaged Affect (typically observed): Appropriate, Pleasant Orientation: : Oriented to Self, Oriented to Place, Oriented to  Time, Oriented to Situation      Admission diagnosis:  Injury of head, initial encounter [S09.90XA] Fall, initial encounter [W19.XXXA] Closed fracture of right hip, initial encounter (HCC) [S72.001A] Closed intertrochanteric fracture of right hip, initial encounter [facility]) [S72.141A] Patient Active Problem List  Diagnosis Date Noted   Prolonged QT interval 06/01/2023   Closed intertrochanteric fracture of right hip, initial encounter (HCC) 06/01/2023   Agatston coronary artery calcium score greater than 400 12/30/2021   Dysphagia 10/04/2021   Aortic atherosclerosis (HCC) 10/04/2021   Lesion of tonsil 03/15/2020   AKI (acute kidney injury)  (HCC) 03/06/2020   CKD stage G2/A2, GFR 60-89 and albumin creatinine ratio 30-299 mg/g 03/06/2020   Hypomagnesemia 03/06/2020   Polymyalgia rheumatica (HCC) 03/06/2020   Hypercalcemia 03/04/2020   Confusion 03/02/2020   Memory loss 03/02/2020   B12 deficiency 01/09/2019   Vertigo 10/31/2018   Hypokalemia 10/31/2018   Chest pain 10/30/2018   Carpal tunnel syndrome, bilateral 08/11/2018   Cerebral artery occlusion with cerebral infarction (HCC) 11/22/2009   BACK PAIN, CHRONIC 11/22/2009   UNSPECIFIED RETINAL DEFECT 11/08/2009   ROTATOR CUFF INJURY, RIGHT SHOULDER 11/08/2009   BRANCH RETINAL VEIN OCCLUSION 08/12/2007   Essential hypertension 08/12/2007   PCP:  Nelwyn Salisbury, MD Pharmacy:   [facility] [phone] - [place],  - [address] AT [address] OF [facility] [address] [place] [address] Phone: [phone] Fax: [phone]     Social Determinants of Health (SDOH) Social History: SDOH Screenings   Food Insecurity: No Food Insecurity (06/01/2023)  Housing: Low Risk  (06/01/2023)  Transportation Needs: No Transportation Needs (06/01/2023)  Utilities: Not At Risk (06/01/2023)  Alcohol Screen: Low Risk  (02/13/2023)  Depression (PHQ2-9): Low Risk  (02/13/2023)  Financial Resource Strain: Low Risk  (02/13/2023)  Physical Activity: Inactive (02/13/2023)  Social Connections: Socially Integrated (02/13/2023)  Stress: No Stress Concern Present (02/13/2023)  Tobacco Use: Low Risk  (06/01/2023)  Health Literacy: Adequate Health Literacy (02/13/2023)   SDOH Interventions:     Readmission Risk Interventions     No data to display

## 2023-06-17 ENCOUNTER — Other Ambulatory Visit (INDEPENDENT_AMBULATORY_CARE_PROVIDER_SITE_OTHER): Payer: Medicare Other

## 2023-06-17 ENCOUNTER — Ambulatory Visit: Payer: Medicare Other | Admitting: Surgical

## 2023-06-17 DIAGNOSIS — S72141A Displaced intertrochanteric fracture of right femur, initial encounter for closed fracture: Secondary | ICD-10-CM

## 2023-06-23 ENCOUNTER — Encounter: Payer: Self-pay | Admitting: Surgical

## 2023-06-23 NOTE — Progress Notes (Signed)
Post-Op Visit Note   Patient: [name]           Date of Birth: 04/11/1946           MRN: [id number] Visit Date: 06/17/2023 PCP: Nelwyn Salisbury, MD   Assessment & Plan:  Chief Complaint:  Chief Complaint  Patient presents with   Right Hip - Routine Post Op    06/01/23 right IM nail   Visit Diagnoses:  1. Closed intertrochanteric fracture of right hip, initial encounter [facility])     Plan: Patient is a 77 year old female who presents s/p right hip intramedullary femoral nail for intertrochanteric femur fracture on 06/01/2023.  Doing well.  She has been nonweightbearing.  Pain is controlled.  No chest pain or shortness of breath or calf pain.  Taking aspirin for DVT prophylaxis according to documentation from the SNF.  On exam, patient has intact hip flexion though it is predictably weak.  She has minimal pain with axially loading the operative hip.  She has palpable DP pulse of the operative extremity.  Intact ankle dorsiflexion and plantarflexion.  No significant calf swelling or any calf tenderness.  Negative Homans' sign.  Incisions are healing well without evidence of infection or dehiscence.  Steri-Strips in place.  Plan is to continue with maintaining nonweightbearing status.  No evidence of hardware failure at this time.  Need to give [name] more time to heal this fracture prior to allowing her to weight-bear.  She will follow-up in 2 weeks for clinical recheck and new radiographs at that time.  If hardware appears stable, plan to progress to partial weightbearing with walker.  Follow-Up Instructions: No follow-ups on file.   Orders:  Orders Placed This Encounter  Procedures   XR FEMUR, MIN 2 VIEWS RIGHT   No orders of the defined types were placed in this encounter.   Imaging: No results found.  PMFS History: Patient Active Problem List   Diagnosis Date Noted   Prolonged QT interval 06/01/2023   Closed intertrochanteric fracture of right hip, initial encounter  (HCC) 06/01/2023   Agatston coronary artery calcium score greater than 400 12/30/2021   Dysphagia 10/04/2021   Aortic atherosclerosis (HCC) 10/04/2021   Lesion of tonsil 03/15/2020   AKI (acute kidney injury) (HCC) 03/06/2020   CKD stage G2/A2, GFR 60-89 and albumin creatinine ratio 30-299 mg/g 03/06/2020   Hypomagnesemia 03/06/2020   Polymyalgia rheumatica (HCC) 03/06/2020   Hypercalcemia 03/04/2020   Confusion 03/02/2020   Memory loss 03/02/2020   B12 deficiency 01/09/2019   Vertigo 10/31/2018   Hypokalemia 10/31/2018   Chest pain 10/30/2018   Carpal tunnel syndrome, bilateral 08/11/2018   Cerebral artery occlusion with cerebral infarction (HCC) 11/22/2009   BACK PAIN, CHRONIC 11/22/2009   UNSPECIFIED RETINAL DEFECT 11/08/2009   ROTATOR CUFF INJURY, RIGHT SHOULDER 11/08/2009   BRANCH RETINAL VEIN OCCLUSION 08/12/2007   Essential hypertension 08/12/2007   Past Medical History:  Diagnosis Date   Agatston coronary artery calcium score greater than 400    827 coronary calcium score which is 92nd percentile for age, race and sex matched controls   Anemia    Anxiety    Aortic atherosclerosis (HCC)    Arthritis    Backache, unspecified    Blood transfusion without reported diagnosis    Cataract    bil eyes   Depression    Hemiplegia, unspecified, affecting unspecified side    Lacunar infarction (HCC)    Other and unspecified hyperlipidemia    Polymyalgia (HCC)  Retinal defect, unspecified    tear of retina   Rotator cuff injury    right shoulder   Unspecified essential hypertension    Venous tributary (branch) occlusion of retina     Family History  Problem Relation Age of Onset   Heart disease Mother        died at age 64   Hyperlipidemia Brother    Hypertension Brother    Diabetes Brother    Heart disease Brother        CAD/MI/CABG   Thyroid disease Daughter    Colon cancer Neg Hx    Esophageal cancer Neg Hx    Rectal cancer Neg Hx    Stomach cancer Neg Hx      Past Surgical History:  Procedure Laterality Date   ABDOMINAL HYSTERECTOMY     CHOLECYSTECTOMY     COLONOSCOPY  01/11/2010   per Dr. Carman Ching, internal hemorrhoids only, repeat in 10 yrs    INTRAMEDULLARY (IM) NAIL INTERTROCHANTERIC Right 06/01/2023   Procedure: INTRAMEDULLARY (IM) NAIL INTERTROCHANTERIC;  Surgeon: Cammy Copa, MD;  Location: [facility] OR;  Service: Orthopedics;  Laterality: Right;   SPHINCTEROTOMY     UPPER GASTROINTESTINAL ENDOSCOPY     Social History   Occupational History   Not on file  Tobacco Use   Smoking status: Never   Smokeless tobacco: Never  Vaping Use   Vaping status: Never Used  Substance and Sexual Activity   Alcohol use: No    Alcohol/week: 0.0 standard drinks of alcohol   Drug use: No   Sexual activity: Not Currently

## 2023-07-01 ENCOUNTER — Encounter: Payer: Self-pay | Admitting: Orthopedic Surgery

## 2023-07-01 ENCOUNTER — Other Ambulatory Visit (INDEPENDENT_AMBULATORY_CARE_PROVIDER_SITE_OTHER): Payer: Medicare Other

## 2023-07-01 ENCOUNTER — Ambulatory Visit (INDEPENDENT_AMBULATORY_CARE_PROVIDER_SITE_OTHER): Payer: Medicare Other | Admitting: Orthopedic Surgery

## 2023-07-01 DIAGNOSIS — S72141A Displaced intertrochanteric fracture of right femur, initial encounter for closed fracture: Secondary | ICD-10-CM

## 2023-07-01 NOTE — Progress Notes (Signed)
Post-Op Visit Note   Patient: [name]           Date of Birth: Oct 20, 1945           MRN: [id number] Visit Date: 07/01/2023 PCP: Nelwyn Salisbury, MD   Assessment & Plan:  Chief Complaint:  Chief Complaint  Patient presents with   Right Leg - Routine Post Op    06/01/23 right IM nail   Visit Diagnoses:  1. Closed intertrochanteric fracture of right hip, initial encounter [facility])     Plan: [name] is a 77 year old patient is a month out right hip intertrochanteric nailing for fracture.  She has been a collapse nursing home.  On examination minimal pain with circumduction of the right hip.  Hip flexion strength on the right is about 4+ out of 5 on the left 5+ out of 5.  Radiographs show some callus formation present.  No hardware position change particular of the compression screw.  No cut out.  Plan is partial weightbearing 50% with 2-week return and repeat radiographs and if they look the same then we will progress to full weightbearing.  Follow-Up Instructions: No follow-ups on file.   Orders:  Orders Placed This Encounter  Procedures   XR FEMUR, MIN 2 VIEWS RIGHT   No orders of the defined types were placed in this encounter.   Imaging: No results found.  PMFS History: Patient Active Problem List   Diagnosis Date Noted   Prolonged QT interval 06/01/2023   Closed intertrochanteric fracture of right hip, initial encounter (HCC) 06/01/2023   Agatston coronary artery calcium score greater than 400 12/30/2021   Dysphagia 10/04/2021   Aortic atherosclerosis (HCC) 10/04/2021   Lesion of tonsil 03/15/2020   AKI (acute kidney injury) (HCC) 03/06/2020   CKD stage G2/A2, GFR 60-89 and albumin creatinine ratio 30-299 mg/g 03/06/2020   Hypomagnesemia 03/06/2020   Polymyalgia rheumatica (HCC) 03/06/2020   Hypercalcemia 03/04/2020   Confusion 03/02/2020   Memory loss 03/02/2020   B12 deficiency 01/09/2019   Vertigo 10/31/2018   Hypokalemia 10/31/2018   Chest pain  10/30/2018   Carpal tunnel syndrome, bilateral 08/11/2018   Cerebral artery occlusion with cerebral infarction (HCC) 11/22/2009   BACK PAIN, CHRONIC 11/22/2009   UNSPECIFIED RETINAL DEFECT 11/08/2009   ROTATOR CUFF INJURY, RIGHT SHOULDER 11/08/2009   BRANCH RETINAL VEIN OCCLUSION 08/12/2007   Essential hypertension 08/12/2007   Past Medical History:  Diagnosis Date   Agatston coronary artery calcium score greater than 400    827 coronary calcium score which is 92nd percentile for age, race and sex matched controls   Anemia    Anxiety    Aortic atherosclerosis (HCC)    Arthritis    Backache, unspecified    Blood transfusion without reported diagnosis    Cataract    bil eyes   Depression    Hemiplegia, unspecified, affecting unspecified side    Lacunar infarction (HCC)    Other and unspecified hyperlipidemia    Polymyalgia (HCC)    Retinal defect, unspecified    tear of retina   Rotator cuff injury    right shoulder   Unspecified essential hypertension    Venous tributary (branch) occlusion of retina     Family History  Problem Relation Age of Onset   Heart disease Mother        died at age 34   Hyperlipidemia Brother    Hypertension Brother    Diabetes Brother    Heart disease Brother  CAD/MI/CABG   Thyroid disease Daughter    Colon cancer Neg Hx    Esophageal cancer Neg Hx    Rectal cancer Neg Hx    Stomach cancer Neg Hx     Past Surgical History:  Procedure Laterality Date   ABDOMINAL HYSTERECTOMY     CHOLECYSTECTOMY     COLONOSCOPY  01/11/2010   per Dr. Carman Ching, internal hemorrhoids only, repeat in 10 yrs    INTRAMEDULLARY (IM) NAIL INTERTROCHANTERIC Right 06/01/2023   Procedure: INTRAMEDULLARY (IM) NAIL INTERTROCHANTERIC;  Surgeon: Cammy Copa, MD;  Location: [facility] OR;  Service: Orthopedics;  Laterality: Right;   SPHINCTEROTOMY     UPPER GASTROINTESTINAL ENDOSCOPY     Social History   Occupational History   Not on file  Tobacco Use    Smoking status: Never   Smokeless tobacco: Never  Vaping Use   Vaping status: Never Used  Substance and Sexual Activity   Alcohol use: No    Alcohol/week: 0.0 standard drinks of alcohol   Drug use: No   Sexual activity: Not Currently

## 2023-07-03 ENCOUNTER — Other Ambulatory Visit: Payer: Self-pay | Admitting: Family Medicine

## 2023-07-03 DIAGNOSIS — I10 Essential (primary) hypertension: Secondary | ICD-10-CM

## 2023-07-15 ENCOUNTER — Encounter: Payer: Self-pay | Admitting: Podiatry

## 2023-07-15 ENCOUNTER — Ambulatory Visit (INDEPENDENT_AMBULATORY_CARE_PROVIDER_SITE_OTHER): Payer: Medicare Other | Admitting: Podiatry

## 2023-07-15 DIAGNOSIS — B351 Tinea unguium: Secondary | ICD-10-CM

## 2023-07-15 DIAGNOSIS — M79674 Pain in right toe(s): Secondary | ICD-10-CM

## 2023-07-15 DIAGNOSIS — M79675 Pain in left toe(s): Secondary | ICD-10-CM

## 2023-07-15 MED ORDER — GABAPENTIN 300 MG PO CAPS: 300.0000 mg | ORAL_CAPSULE | Freq: Three times a day (TID) | ORAL | 1 refills | Status: DC

## 2023-07-15 NOTE — Progress Notes (Signed)
   Chief Complaint  Patient presents with   Nail Problem    Patient states she is here to get her toe nails cut for bilateral feet .     SUBJECTIVE Patient presents to office today complaining of elongated, thickened nails that cause pain while ambulating in shoes.  Patient is unable to trim their own nails. Patient is here for further evaluation and treatment.  Past Medical History:  Diagnosis Date   Agatston coronary artery calcium score greater than 400    827 coronary calcium score which is 92nd percentile for age, race and sex matched controls   Anemia    Anxiety    Aortic atherosclerosis (HCC)    Arthritis    Backache, unspecified    Blood transfusion without reported diagnosis    Cataract    bil eyes   Depression    Hemiplegia, unspecified, affecting unspecified side    Lacunar infarction (HCC)    Other and unspecified hyperlipidemia    Polymyalgia (HCC)    Retinal defect, unspecified    tear of retina   Rotator cuff injury    right shoulder   Unspecified essential hypertension    Venous tributary (branch) occlusion of retina     Allergies  Allergen Reactions   Amoxicillin Nausea Only   Meloxicam Other (See Comments)    Muscle weakness      OBJECTIVE General Patient is awake, alert, and oriented x 3 and in no acute distress. Derm Skin is dry and supple bilateral. Negative open lesions or macerations. Remaining integument unremarkable. Nails are tender, long, thickened and dystrophic with subungual debris, consistent with onychomycosis, 1-5 bilateral. No signs of infection noted. Vasc  DP and PT pedal pulses palpable bilaterally. Temperature gradient within normal limits.  Neuro Epicritic and protective threshold sensation grossly intact bilaterally.  Musculoskeletal Exam No symptomatic pedal deformities noted bilateral. Muscular strength within normal limits.  ASSESSMENT 1.  Pain due to onychomycosis of toenails both  PLAN OF CARE 1. Patient evaluated  today.  2. Instructed to maintain good pedal hygiene and foot care.  3. Mechanical debridement of nails 1-5 bilaterally performed using a nail nipper. Filed with dremel without incident.  4. Return to clinic in 3 mos.    Felecia Shelling, DPM [facility]  Dr. Felecia Shelling, DPM    [address] [place], [place] [address]                Office [phone]  Fax [phone]

## 2023-07-18 ENCOUNTER — Telehealth: Payer: Self-pay | Admitting: Podiatry

## 2023-07-18 ENCOUNTER — Encounter: Payer: Self-pay | Admitting: Surgical

## 2023-07-18 ENCOUNTER — Other Ambulatory Visit (INDEPENDENT_AMBULATORY_CARE_PROVIDER_SITE_OTHER): Payer: Medicare HMO

## 2023-07-18 ENCOUNTER — Ambulatory Visit (INDEPENDENT_AMBULATORY_CARE_PROVIDER_SITE_OTHER): Payer: Medicare HMO | Admitting: Surgical

## 2023-07-18 DIAGNOSIS — M79604 Pain in right leg: Secondary | ICD-10-CM | POA: Diagnosis not present

## 2023-07-18 DIAGNOSIS — S72141A Displaced intertrochanteric fracture of right femur, initial encounter for closed fracture: Secondary | ICD-10-CM

## 2023-07-18 NOTE — Telephone Encounter (Signed)
 Patient came in. She received a prescription for gabapentin and said she was never supposed to have a prescription for anything.Marland KitchenMarland Kitchen

## 2023-07-19 ENCOUNTER — Encounter: Payer: Self-pay | Admitting: Surgical

## 2023-07-19 NOTE — Progress Notes (Signed)
 Post-Op Visit Note   Patient: [name]           Date of Birth: 30-Jul-1945           MRN: [id number] Visit Date: 07/18/2023 PCP: Johnny Garnette LABOR, MD   Assessment & Plan:  Chief Complaint:  Chief Complaint  Patient presents with   Right Leg - Routine Post Op   Visit Diagnoses:  1. Closed intertrochanteric fracture of right hip, initial encounter (HCC)   2. Pain in right leg     Plan: Patient is a 78 year old female who presents s/p right intramedullary nail placement for intertrochanteric femur fracture on 06/01/2023.  She states that she is progressing well.  She has returned home from skilled nursing facility.  Ambulating with a wheelchair and partial weightbearing with a walker.  Last saw Dr. Addie about 2 weeks ago and she is here today for repeat radiographs in order to assess for any fracture displacement or change in appearance of the hardware.  Her main complaint is that she has difficulty with hip flexion if she tries to bend over to put her sock on.  She is not taking any pain medication.  No longer taking aspirin  and she was recommended to continue taking the aspirin  now that she is home in order to prevent blood clot while she is still less mobile than is typical for her.  On exam, patient does have intact hip flexion with excellent strength rated 5 -/5.  She has positive FADIR sign reproducing mild hip pain.  No pain with axial loading of the hip.  Incisions are well-healed.  No calf tenderness.  Negative Homans' sign.  Palpable DP pulse.  No knee effusion noted.  Plan is progress weightbearing status from partial weightbearing with walker to full weightbearing with walker.  Radiographs taken today demonstrate no change in position or alignment of the fracture or of the intramedullary nail.  All questions answered.  We will have her weight-bear with a walker and she will work with home health physical therapy.  Follow-up in 4 weeks for clinical recheck with myself or Dr.  Addie.  Follow-Up Instructions: No follow-ups on file.   Orders:  Orders Placed This Encounter  Procedures   XR FEMUR, MIN 2 VIEWS RIGHT   No orders of the defined types were placed in this encounter.   Imaging: No results found.  PMFS History: Patient Active Problem List   Diagnosis Date Noted   Prolonged QT interval 06/01/2023   Closed intertrochanteric fracture of right hip, initial encounter (HCC) 06/01/2023   Agatston coronary artery calcium  score greater than 400 12/30/2021   Dysphagia 10/04/2021   Aortic atherosclerosis (HCC) 10/04/2021   Lesion of tonsil 03/15/2020   AKI (acute kidney injury) (HCC) 03/06/2020   CKD stage G2/A2, GFR 60-89 and albumin  creatinine ratio 30-299 mg/g 03/06/2020   Hypomagnesemia 03/06/2020   Polymyalgia rheumatica (HCC) 03/06/2020   Hypercalcemia 03/04/2020   Confusion 03/02/2020   Memory loss 03/02/2020   B12 deficiency 01/09/2019   Vertigo 10/31/2018   Hypokalemia 10/31/2018   Chest pain 10/30/2018   Carpal tunnel syndrome, bilateral 08/11/2018   Cerebral artery occlusion with cerebral infarction (HCC) 11/22/2009   BACK PAIN, CHRONIC 11/22/2009   UNSPECIFIED RETINAL DEFECT 11/08/2009   ROTATOR CUFF INJURY, RIGHT SHOULDER 11/08/2009   BRANCH RETINAL VEIN OCCLUSION 08/12/2007   Essential hypertension 08/12/2007   Past Medical History:  Diagnosis Date   Agatston coronary artery calcium  score greater than 400    827  coronary calcium  score which is 92nd percentile for age, race and sex matched controls   Anemia    Anxiety    Aortic atherosclerosis (HCC)    Arthritis    Backache, unspecified    Blood transfusion without reported diagnosis    Cataract    bil eyes   Depression    Hemiplegia, unspecified, affecting unspecified side    Lacunar infarction (HCC)    Other and unspecified hyperlipidemia    Polymyalgia (HCC)    Retinal defect, unspecified    tear of retina   Rotator cuff injury    right shoulder   Unspecified  essential hypertension    Venous tributary (branch) occlusion of retina     Family History  Problem Relation Age of Onset   Heart disease Mother        died at age 70   Hyperlipidemia Brother    Hypertension Brother    Diabetes Brother    Heart disease Brother        CAD/MI/CABG   Thyroid  disease Daughter    Colon cancer Neg Hx    Esophageal cancer Neg Hx    Rectal cancer Neg Hx    Stomach cancer Neg Hx     Past Surgical History:  Procedure Laterality Date   ABDOMINAL HYSTERECTOMY     CHOLECYSTECTOMY     COLONOSCOPY  01/11/2010   per Dr. Lynwood Bohr, internal hemorrhoids only, repeat in 10 yrs    INTRAMEDULLARY (IM) NAIL INTERTROCHANTERIC Right 06/01/2023   Procedure: INTRAMEDULLARY (IM) NAIL INTERTROCHANTERIC;  Surgeon: Addie Cordella Hamilton, MD;  Location: [facility] OR;  Service: Orthopedics;  Laterality: Right;   SPHINCTEROTOMY     UPPER GASTROINTESTINAL ENDOSCOPY     Social History   Occupational History   Not on file  Tobacco Use   Smoking status: Never   Smokeless tobacco: Never  Vaping Use   Vaping status: Never Used  Substance and Sexual Activity   Alcohol use: No    Alcohol/week: 0.0 standard drinks of alcohol   Drug use: No   Sexual activity: Not Currently

## 2023-07-23 DIAGNOSIS — S0003XD Contusion of scalp, subsequent encounter: Secondary | ICD-10-CM | POA: Diagnosis not present

## 2023-07-23 DIAGNOSIS — Z8673 Personal history of transient ischemic attack (TIA), and cerebral infarction without residual deficits: Secondary | ICD-10-CM | POA: Diagnosis not present

## 2023-07-23 DIAGNOSIS — M1990 Unspecified osteoarthritis, unspecified site: Secondary | ICD-10-CM | POA: Diagnosis not present

## 2023-07-23 DIAGNOSIS — D62 Acute posthemorrhagic anemia: Secondary | ICD-10-CM | POA: Diagnosis not present

## 2023-07-23 DIAGNOSIS — Z9181 History of falling: Secondary | ICD-10-CM | POA: Diagnosis not present

## 2023-07-23 DIAGNOSIS — E559 Vitamin D deficiency, unspecified: Secondary | ICD-10-CM | POA: Diagnosis not present

## 2023-07-23 DIAGNOSIS — Z96649 Presence of unspecified artificial hip joint: Secondary | ICD-10-CM | POA: Diagnosis not present

## 2023-07-23 DIAGNOSIS — E876 Hypokalemia: Secondary | ICD-10-CM | POA: Diagnosis not present

## 2023-07-23 DIAGNOSIS — I4581 Long QT syndrome: Secondary | ICD-10-CM | POA: Diagnosis not present

## 2023-07-23 DIAGNOSIS — R1314 Dysphagia, pharyngoesophageal phase: Secondary | ICD-10-CM | POA: Diagnosis not present

## 2023-07-23 DIAGNOSIS — S72141D Displaced intertrochanteric fracture of right femur, subsequent encounter for closed fracture with routine healing: Secondary | ICD-10-CM | POA: Diagnosis not present

## 2023-07-23 DIAGNOSIS — R41841 Cognitive communication deficit: Secondary | ICD-10-CM | POA: Diagnosis not present

## 2023-07-23 DIAGNOSIS — E785 Hyperlipidemia, unspecified: Secondary | ICD-10-CM | POA: Diagnosis not present

## 2023-07-23 DIAGNOSIS — W19XXXD Unspecified fall, subsequent encounter: Secondary | ICD-10-CM | POA: Diagnosis not present

## 2023-07-23 DIAGNOSIS — M353 Polymyalgia rheumatica: Secondary | ICD-10-CM | POA: Diagnosis not present

## 2023-07-23 DIAGNOSIS — N182 Chronic kidney disease, stage 2 (mild): Secondary | ICD-10-CM | POA: Diagnosis not present

## 2023-07-23 DIAGNOSIS — I129 Hypertensive chronic kidney disease with stage 1 through stage 4 chronic kidney disease, or unspecified chronic kidney disease: Secondary | ICD-10-CM | POA: Diagnosis not present

## 2023-07-24 DIAGNOSIS — M353 Polymyalgia rheumatica: Secondary | ICD-10-CM | POA: Diagnosis not present

## 2023-07-24 DIAGNOSIS — N182 Chronic kidney disease, stage 2 (mild): Secondary | ICD-10-CM | POA: Diagnosis not present

## 2023-07-24 DIAGNOSIS — E559 Vitamin D deficiency, unspecified: Secondary | ICD-10-CM | POA: Diagnosis not present

## 2023-07-24 DIAGNOSIS — E876 Hypokalemia: Secondary | ICD-10-CM | POA: Diagnosis not present

## 2023-07-24 DIAGNOSIS — R1314 Dysphagia, pharyngoesophageal phase: Secondary | ICD-10-CM | POA: Diagnosis not present

## 2023-07-24 DIAGNOSIS — R41841 Cognitive communication deficit: Secondary | ICD-10-CM | POA: Diagnosis not present

## 2023-07-24 DIAGNOSIS — E785 Hyperlipidemia, unspecified: Secondary | ICD-10-CM | POA: Diagnosis not present

## 2023-07-24 DIAGNOSIS — M1990 Unspecified osteoarthritis, unspecified site: Secondary | ICD-10-CM | POA: Diagnosis not present

## 2023-07-24 DIAGNOSIS — W19XXXD Unspecified fall, subsequent encounter: Secondary | ICD-10-CM | POA: Diagnosis not present

## 2023-07-24 DIAGNOSIS — Z96649 Presence of unspecified artificial hip joint: Secondary | ICD-10-CM | POA: Diagnosis not present

## 2023-07-24 DIAGNOSIS — Z8673 Personal history of transient ischemic attack (TIA), and cerebral infarction without residual deficits: Secondary | ICD-10-CM | POA: Diagnosis not present

## 2023-07-24 DIAGNOSIS — S72141D Displaced intertrochanteric fracture of right femur, subsequent encounter for closed fracture with routine healing: Secondary | ICD-10-CM | POA: Diagnosis not present

## 2023-07-24 DIAGNOSIS — S0003XD Contusion of scalp, subsequent encounter: Secondary | ICD-10-CM | POA: Diagnosis not present

## 2023-07-24 DIAGNOSIS — D62 Acute posthemorrhagic anemia: Secondary | ICD-10-CM | POA: Diagnosis not present

## 2023-07-24 DIAGNOSIS — I4581 Long QT syndrome: Secondary | ICD-10-CM | POA: Diagnosis not present

## 2023-07-24 DIAGNOSIS — Z9181 History of falling: Secondary | ICD-10-CM | POA: Diagnosis not present

## 2023-07-24 DIAGNOSIS — I129 Hypertensive chronic kidney disease with stage 1 through stage 4 chronic kidney disease, or unspecified chronic kidney disease: Secondary | ICD-10-CM | POA: Diagnosis not present

## 2023-07-30 DIAGNOSIS — D62 Acute posthemorrhagic anemia: Secondary | ICD-10-CM | POA: Diagnosis not present

## 2023-07-30 DIAGNOSIS — Z8673 Personal history of transient ischemic attack (TIA), and cerebral infarction without residual deficits: Secondary | ICD-10-CM | POA: Diagnosis not present

## 2023-07-30 DIAGNOSIS — Z9181 History of falling: Secondary | ICD-10-CM | POA: Diagnosis not present

## 2023-07-30 DIAGNOSIS — N182 Chronic kidney disease, stage 2 (mild): Secondary | ICD-10-CM | POA: Diagnosis not present

## 2023-07-30 DIAGNOSIS — Z96649 Presence of unspecified artificial hip joint: Secondary | ICD-10-CM | POA: Diagnosis not present

## 2023-07-30 DIAGNOSIS — E876 Hypokalemia: Secondary | ICD-10-CM | POA: Diagnosis not present

## 2023-07-30 DIAGNOSIS — W19XXXD Unspecified fall, subsequent encounter: Secondary | ICD-10-CM | POA: Diagnosis not present

## 2023-07-30 DIAGNOSIS — I4581 Long QT syndrome: Secondary | ICD-10-CM | POA: Diagnosis not present

## 2023-07-30 DIAGNOSIS — S72141D Displaced intertrochanteric fracture of right femur, subsequent encounter for closed fracture with routine healing: Secondary | ICD-10-CM | POA: Diagnosis not present

## 2023-07-30 DIAGNOSIS — I129 Hypertensive chronic kidney disease with stage 1 through stage 4 chronic kidney disease, or unspecified chronic kidney disease: Secondary | ICD-10-CM | POA: Diagnosis not present

## 2023-07-30 DIAGNOSIS — E559 Vitamin D deficiency, unspecified: Secondary | ICD-10-CM | POA: Diagnosis not present

## 2023-07-30 DIAGNOSIS — E785 Hyperlipidemia, unspecified: Secondary | ICD-10-CM | POA: Diagnosis not present

## 2023-07-30 DIAGNOSIS — S0003XD Contusion of scalp, subsequent encounter: Secondary | ICD-10-CM | POA: Diagnosis not present

## 2023-07-30 DIAGNOSIS — R1314 Dysphagia, pharyngoesophageal phase: Secondary | ICD-10-CM | POA: Diagnosis not present

## 2023-07-30 DIAGNOSIS — M353 Polymyalgia rheumatica: Secondary | ICD-10-CM | POA: Diagnosis not present

## 2023-07-30 DIAGNOSIS — R41841 Cognitive communication deficit: Secondary | ICD-10-CM | POA: Diagnosis not present

## 2023-07-30 DIAGNOSIS — M1990 Unspecified osteoarthritis, unspecified site: Secondary | ICD-10-CM | POA: Diagnosis not present

## 2023-08-01 DIAGNOSIS — E559 Vitamin D deficiency, unspecified: Secondary | ICD-10-CM | POA: Diagnosis not present

## 2023-08-01 DIAGNOSIS — W19XXXD Unspecified fall, subsequent encounter: Secondary | ICD-10-CM | POA: Diagnosis not present

## 2023-08-01 DIAGNOSIS — E876 Hypokalemia: Secondary | ICD-10-CM | POA: Diagnosis not present

## 2023-08-01 DIAGNOSIS — R41841 Cognitive communication deficit: Secondary | ICD-10-CM | POA: Diagnosis not present

## 2023-08-01 DIAGNOSIS — S72141D Displaced intertrochanteric fracture of right femur, subsequent encounter for closed fracture with routine healing: Secondary | ICD-10-CM | POA: Diagnosis not present

## 2023-08-01 DIAGNOSIS — I129 Hypertensive chronic kidney disease with stage 1 through stage 4 chronic kidney disease, or unspecified chronic kidney disease: Secondary | ICD-10-CM | POA: Diagnosis not present

## 2023-08-01 DIAGNOSIS — M1990 Unspecified osteoarthritis, unspecified site: Secondary | ICD-10-CM | POA: Diagnosis not present

## 2023-08-01 DIAGNOSIS — Z8673 Personal history of transient ischemic attack (TIA), and cerebral infarction without residual deficits: Secondary | ICD-10-CM | POA: Diagnosis not present

## 2023-08-01 DIAGNOSIS — I4581 Long QT syndrome: Secondary | ICD-10-CM | POA: Diagnosis not present

## 2023-08-01 DIAGNOSIS — E785 Hyperlipidemia, unspecified: Secondary | ICD-10-CM | POA: Diagnosis not present

## 2023-08-01 DIAGNOSIS — S0003XD Contusion of scalp, subsequent encounter: Secondary | ICD-10-CM | POA: Diagnosis not present

## 2023-08-01 DIAGNOSIS — Z9181 History of falling: Secondary | ICD-10-CM | POA: Diagnosis not present

## 2023-08-01 DIAGNOSIS — R1314 Dysphagia, pharyngoesophageal phase: Secondary | ICD-10-CM | POA: Diagnosis not present

## 2023-08-01 DIAGNOSIS — N182 Chronic kidney disease, stage 2 (mild): Secondary | ICD-10-CM | POA: Diagnosis not present

## 2023-08-01 DIAGNOSIS — Z96649 Presence of unspecified artificial hip joint: Secondary | ICD-10-CM | POA: Diagnosis not present

## 2023-08-01 DIAGNOSIS — M353 Polymyalgia rheumatica: Secondary | ICD-10-CM | POA: Diagnosis not present

## 2023-08-01 DIAGNOSIS — D62 Acute posthemorrhagic anemia: Secondary | ICD-10-CM | POA: Diagnosis not present

## 2023-08-02 ENCOUNTER — Telehealth: Payer: Self-pay | Admitting: Family Medicine

## 2023-08-02 DIAGNOSIS — D62 Acute posthemorrhagic anemia: Secondary | ICD-10-CM | POA: Diagnosis not present

## 2023-08-02 DIAGNOSIS — S0003XD Contusion of scalp, subsequent encounter: Secondary | ICD-10-CM | POA: Diagnosis not present

## 2023-08-02 DIAGNOSIS — W19XXXD Unspecified fall, subsequent encounter: Secondary | ICD-10-CM | POA: Diagnosis not present

## 2023-08-02 DIAGNOSIS — Z96649 Presence of unspecified artificial hip joint: Secondary | ICD-10-CM | POA: Diagnosis not present

## 2023-08-02 DIAGNOSIS — R1314 Dysphagia, pharyngoesophageal phase: Secondary | ICD-10-CM | POA: Diagnosis not present

## 2023-08-02 DIAGNOSIS — E876 Hypokalemia: Secondary | ICD-10-CM | POA: Diagnosis not present

## 2023-08-02 DIAGNOSIS — I129 Hypertensive chronic kidney disease with stage 1 through stage 4 chronic kidney disease, or unspecified chronic kidney disease: Secondary | ICD-10-CM | POA: Diagnosis not present

## 2023-08-02 DIAGNOSIS — E785 Hyperlipidemia, unspecified: Secondary | ICD-10-CM | POA: Diagnosis not present

## 2023-08-02 DIAGNOSIS — M1990 Unspecified osteoarthritis, unspecified site: Secondary | ICD-10-CM | POA: Diagnosis not present

## 2023-08-02 DIAGNOSIS — M353 Polymyalgia rheumatica: Secondary | ICD-10-CM | POA: Diagnosis not present

## 2023-08-02 DIAGNOSIS — Z9181 History of falling: Secondary | ICD-10-CM | POA: Diagnosis not present

## 2023-08-02 DIAGNOSIS — I4581 Long QT syndrome: Secondary | ICD-10-CM | POA: Diagnosis not present

## 2023-08-02 DIAGNOSIS — Z8673 Personal history of transient ischemic attack (TIA), and cerebral infarction without residual deficits: Secondary | ICD-10-CM | POA: Diagnosis not present

## 2023-08-02 DIAGNOSIS — E559 Vitamin D deficiency, unspecified: Secondary | ICD-10-CM | POA: Diagnosis not present

## 2023-08-02 DIAGNOSIS — R41841 Cognitive communication deficit: Secondary | ICD-10-CM | POA: Diagnosis not present

## 2023-08-02 DIAGNOSIS — S72141D Displaced intertrochanteric fracture of right femur, subsequent encounter for closed fracture with routine healing: Secondary | ICD-10-CM | POA: Diagnosis not present

## 2023-08-02 DIAGNOSIS — N182 Chronic kidney disease, stage 2 (mild): Secondary | ICD-10-CM | POA: Diagnosis not present

## 2023-08-02 NOTE — Telephone Encounter (Signed)
Copied from CRM [id number]. Topic: General - Other >> Aug 02, 2023  3:23 PM [name] wrote: Reason for CRM: Dava from [facility] called to report that the pt canceled her occupational therapy today and will continue next week. Please advise.

## 2023-08-05 DIAGNOSIS — E559 Vitamin D deficiency, unspecified: Secondary | ICD-10-CM | POA: Diagnosis not present

## 2023-08-05 DIAGNOSIS — M1990 Unspecified osteoarthritis, unspecified site: Secondary | ICD-10-CM | POA: Diagnosis not present

## 2023-08-05 DIAGNOSIS — Z9181 History of falling: Secondary | ICD-10-CM | POA: Diagnosis not present

## 2023-08-05 DIAGNOSIS — I129 Hypertensive chronic kidney disease with stage 1 through stage 4 chronic kidney disease, or unspecified chronic kidney disease: Secondary | ICD-10-CM | POA: Diagnosis not present

## 2023-08-05 DIAGNOSIS — N182 Chronic kidney disease, stage 2 (mild): Secondary | ICD-10-CM | POA: Diagnosis not present

## 2023-08-05 DIAGNOSIS — M353 Polymyalgia rheumatica: Secondary | ICD-10-CM | POA: Diagnosis not present

## 2023-08-05 DIAGNOSIS — S72141D Displaced intertrochanteric fracture of right femur, subsequent encounter for closed fracture with routine healing: Secondary | ICD-10-CM | POA: Diagnosis not present

## 2023-08-05 DIAGNOSIS — E876 Hypokalemia: Secondary | ICD-10-CM | POA: Diagnosis not present

## 2023-08-05 DIAGNOSIS — I4581 Long QT syndrome: Secondary | ICD-10-CM | POA: Diagnosis not present

## 2023-08-05 DIAGNOSIS — D62 Acute posthemorrhagic anemia: Secondary | ICD-10-CM | POA: Diagnosis not present

## 2023-08-05 DIAGNOSIS — Z8673 Personal history of transient ischemic attack (TIA), and cerebral infarction without residual deficits: Secondary | ICD-10-CM | POA: Diagnosis not present

## 2023-08-05 DIAGNOSIS — E785 Hyperlipidemia, unspecified: Secondary | ICD-10-CM | POA: Diagnosis not present

## 2023-08-05 DIAGNOSIS — S0003XD Contusion of scalp, subsequent encounter: Secondary | ICD-10-CM | POA: Diagnosis not present

## 2023-08-05 DIAGNOSIS — R41841 Cognitive communication deficit: Secondary | ICD-10-CM | POA: Diagnosis not present

## 2023-08-05 DIAGNOSIS — R1314 Dysphagia, pharyngoesophageal phase: Secondary | ICD-10-CM | POA: Diagnosis not present

## 2023-08-05 DIAGNOSIS — W19XXXD Unspecified fall, subsequent encounter: Secondary | ICD-10-CM | POA: Diagnosis not present

## 2023-08-05 DIAGNOSIS — Z96649 Presence of unspecified artificial hip joint: Secondary | ICD-10-CM | POA: Diagnosis not present

## 2023-08-05 NOTE — Telephone Encounter (Signed)
FYI

## 2023-08-06 DIAGNOSIS — D62 Acute posthemorrhagic anemia: Secondary | ICD-10-CM | POA: Diagnosis not present

## 2023-08-06 DIAGNOSIS — N182 Chronic kidney disease, stage 2 (mild): Secondary | ICD-10-CM | POA: Diagnosis not present

## 2023-08-06 DIAGNOSIS — E876 Hypokalemia: Secondary | ICD-10-CM | POA: Diagnosis not present

## 2023-08-06 DIAGNOSIS — E785 Hyperlipidemia, unspecified: Secondary | ICD-10-CM | POA: Diagnosis not present

## 2023-08-06 DIAGNOSIS — W19XXXD Unspecified fall, subsequent encounter: Secondary | ICD-10-CM | POA: Diagnosis not present

## 2023-08-06 DIAGNOSIS — Z8673 Personal history of transient ischemic attack (TIA), and cerebral infarction without residual deficits: Secondary | ICD-10-CM | POA: Diagnosis not present

## 2023-08-06 DIAGNOSIS — M353 Polymyalgia rheumatica: Secondary | ICD-10-CM | POA: Diagnosis not present

## 2023-08-06 DIAGNOSIS — Z96649 Presence of unspecified artificial hip joint: Secondary | ICD-10-CM | POA: Diagnosis not present

## 2023-08-06 DIAGNOSIS — I4581 Long QT syndrome: Secondary | ICD-10-CM | POA: Diagnosis not present

## 2023-08-06 DIAGNOSIS — R1314 Dysphagia, pharyngoesophageal phase: Secondary | ICD-10-CM | POA: Diagnosis not present

## 2023-08-06 DIAGNOSIS — I129 Hypertensive chronic kidney disease with stage 1 through stage 4 chronic kidney disease, or unspecified chronic kidney disease: Secondary | ICD-10-CM | POA: Diagnosis not present

## 2023-08-06 DIAGNOSIS — M1990 Unspecified osteoarthritis, unspecified site: Secondary | ICD-10-CM | POA: Diagnosis not present

## 2023-08-06 DIAGNOSIS — Z9181 History of falling: Secondary | ICD-10-CM | POA: Diagnosis not present

## 2023-08-06 DIAGNOSIS — S72141D Displaced intertrochanteric fracture of right femur, subsequent encounter for closed fracture with routine healing: Secondary | ICD-10-CM | POA: Diagnosis not present

## 2023-08-06 DIAGNOSIS — S0003XD Contusion of scalp, subsequent encounter: Secondary | ICD-10-CM | POA: Diagnosis not present

## 2023-08-06 DIAGNOSIS — R41841 Cognitive communication deficit: Secondary | ICD-10-CM | POA: Diagnosis not present

## 2023-08-06 DIAGNOSIS — E559 Vitamin D deficiency, unspecified: Secondary | ICD-10-CM | POA: Diagnosis not present

## 2023-08-07 ENCOUNTER — Telehealth: Payer: Self-pay

## 2023-08-07 NOTE — Telephone Encounter (Signed)
Spoke with a [facility] agent provided the correct fax number, pt forms faxed and confirmation received

## 2023-08-07 NOTE — Telephone Encounter (Signed)
Copied from CRM [id number]. Topic: Clinical - Home Health Verbal Orders >> Aug 07, 2023  9:38 AM Ferdie Ping wrote: Caller/Agency: [facility] Callback Number: [phone] extension [phone] Service Requested: sign faxes that were sent on 1/17 and 1/20 Fax order numbers: [phone] Any new concerns about the patient? No

## 2023-08-08 DIAGNOSIS — Z8673 Personal history of transient ischemic attack (TIA), and cerebral infarction without residual deficits: Secondary | ICD-10-CM | POA: Diagnosis not present

## 2023-08-08 DIAGNOSIS — E785 Hyperlipidemia, unspecified: Secondary | ICD-10-CM | POA: Diagnosis not present

## 2023-08-08 DIAGNOSIS — M353 Polymyalgia rheumatica: Secondary | ICD-10-CM | POA: Diagnosis not present

## 2023-08-08 DIAGNOSIS — R1314 Dysphagia, pharyngoesophageal phase: Secondary | ICD-10-CM | POA: Diagnosis not present

## 2023-08-08 DIAGNOSIS — Z96649 Presence of unspecified artificial hip joint: Secondary | ICD-10-CM | POA: Diagnosis not present

## 2023-08-08 DIAGNOSIS — S72141D Displaced intertrochanteric fracture of right femur, subsequent encounter for closed fracture with routine healing: Secondary | ICD-10-CM | POA: Diagnosis not present

## 2023-08-08 DIAGNOSIS — E559 Vitamin D deficiency, unspecified: Secondary | ICD-10-CM | POA: Diagnosis not present

## 2023-08-08 DIAGNOSIS — M1990 Unspecified osteoarthritis, unspecified site: Secondary | ICD-10-CM | POA: Diagnosis not present

## 2023-08-08 DIAGNOSIS — E876 Hypokalemia: Secondary | ICD-10-CM | POA: Diagnosis not present

## 2023-08-08 DIAGNOSIS — Z9181 History of falling: Secondary | ICD-10-CM | POA: Diagnosis not present

## 2023-08-08 DIAGNOSIS — D62 Acute posthemorrhagic anemia: Secondary | ICD-10-CM | POA: Diagnosis not present

## 2023-08-08 DIAGNOSIS — S0003XD Contusion of scalp, subsequent encounter: Secondary | ICD-10-CM | POA: Diagnosis not present

## 2023-08-08 DIAGNOSIS — I4581 Long QT syndrome: Secondary | ICD-10-CM | POA: Diagnosis not present

## 2023-08-08 DIAGNOSIS — N182 Chronic kidney disease, stage 2 (mild): Secondary | ICD-10-CM | POA: Diagnosis not present

## 2023-08-08 DIAGNOSIS — I129 Hypertensive chronic kidney disease with stage 1 through stage 4 chronic kidney disease, or unspecified chronic kidney disease: Secondary | ICD-10-CM | POA: Diagnosis not present

## 2023-08-08 DIAGNOSIS — W19XXXD Unspecified fall, subsequent encounter: Secondary | ICD-10-CM | POA: Diagnosis not present

## 2023-08-08 DIAGNOSIS — R41841 Cognitive communication deficit: Secondary | ICD-10-CM | POA: Diagnosis not present

## 2023-08-09 ENCOUNTER — Telehealth: Payer: Self-pay

## 2023-08-09 NOTE — Telephone Encounter (Signed)
Copied from CRM [id number]. Topic: General - Other >> Aug 09, 2023 12:41 PM Pascal Lux wrote: Reason for CRM: Patient called and stated her home health therapist advised patient to call us and place a verbal order for bed side toilet chair and a rollator.

## 2023-08-12 NOTE — Telephone Encounter (Signed)
The written RX for these is ready to fax

## 2023-08-12 NOTE — Telephone Encounter (Signed)
Pt called back, I let her know we need to know where to send the RX to, pt states she was confused and didn't know what that meant. She is requesting a call back from the nurse to discuss further.

## 2023-08-12 NOTE — Telephone Encounter (Signed)
Left pt a message advised to call the office back regarding where to send the Rx for Bedside Commode/ Rolator Walker.

## 2023-08-13 NOTE — Telephone Encounter (Signed)
Called pt back to advise that VO requested for Bedside commode and Rolator walker was faxed to [facility]. Not successful speaking with pt. Will try again later

## 2023-08-14 ENCOUNTER — Telehealth: Payer: Self-pay | Admitting: Family Medicine

## 2023-08-14 DIAGNOSIS — Z9181 History of falling: Secondary | ICD-10-CM | POA: Diagnosis not present

## 2023-08-14 DIAGNOSIS — M1990 Unspecified osteoarthritis, unspecified site: Secondary | ICD-10-CM | POA: Diagnosis not present

## 2023-08-14 DIAGNOSIS — E559 Vitamin D deficiency, unspecified: Secondary | ICD-10-CM | POA: Diagnosis not present

## 2023-08-14 DIAGNOSIS — Z96649 Presence of unspecified artificial hip joint: Secondary | ICD-10-CM | POA: Diagnosis not present

## 2023-08-14 DIAGNOSIS — D62 Acute posthemorrhagic anemia: Secondary | ICD-10-CM | POA: Diagnosis not present

## 2023-08-14 DIAGNOSIS — I129 Hypertensive chronic kidney disease with stage 1 through stage 4 chronic kidney disease, or unspecified chronic kidney disease: Secondary | ICD-10-CM | POA: Diagnosis not present

## 2023-08-14 DIAGNOSIS — E785 Hyperlipidemia, unspecified: Secondary | ICD-10-CM | POA: Diagnosis not present

## 2023-08-14 DIAGNOSIS — Z8673 Personal history of transient ischemic attack (TIA), and cerebral infarction without residual deficits: Secondary | ICD-10-CM | POA: Diagnosis not present

## 2023-08-14 DIAGNOSIS — M353 Polymyalgia rheumatica: Secondary | ICD-10-CM | POA: Diagnosis not present

## 2023-08-14 DIAGNOSIS — W19XXXD Unspecified fall, subsequent encounter: Secondary | ICD-10-CM | POA: Diagnosis not present

## 2023-08-14 DIAGNOSIS — R1314 Dysphagia, pharyngoesophageal phase: Secondary | ICD-10-CM | POA: Diagnosis not present

## 2023-08-14 DIAGNOSIS — N182 Chronic kidney disease, stage 2 (mild): Secondary | ICD-10-CM | POA: Diagnosis not present

## 2023-08-14 DIAGNOSIS — R41841 Cognitive communication deficit: Secondary | ICD-10-CM | POA: Diagnosis not present

## 2023-08-14 DIAGNOSIS — S72141D Displaced intertrochanteric fracture of right femur, subsequent encounter for closed fracture with routine healing: Secondary | ICD-10-CM | POA: Diagnosis not present

## 2023-08-14 DIAGNOSIS — S0003XD Contusion of scalp, subsequent encounter: Secondary | ICD-10-CM | POA: Diagnosis not present

## 2023-08-14 DIAGNOSIS — E876 Hypokalemia: Secondary | ICD-10-CM | POA: Diagnosis not present

## 2023-08-14 DIAGNOSIS — I4581 Long QT syndrome: Secondary | ICD-10-CM | POA: Diagnosis not present

## 2023-08-14 NOTE — Telephone Encounter (Signed)
Pt order for bedside commode and Rotator walker was faxed to [facility], left pt a message, advised to call the office back for details

## 2023-08-14 NOTE — Telephone Encounter (Signed)
Please okay these orders  ?

## 2023-08-14 NOTE — Telephone Encounter (Signed)
Ok for VO

## 2023-08-14 NOTE — Telephone Encounter (Signed)
Copied from CRM [id number]. Topic: Clinical - Home Health Verbal Orders >> Aug 14, 2023  2:34 PM Eunice Blase wrote: Caller/Agency: [facility] per [facility] Number: [phone] [phone] Service Requested: Speech Therapy Frequency: 1week 6 Any new concerns about the patient? No

## 2023-08-15 ENCOUNTER — Ambulatory Visit: Payer: Self-pay | Admitting: Family Medicine

## 2023-08-15 ENCOUNTER — Encounter (HOSPITAL_BASED_OUTPATIENT_CLINIC_OR_DEPARTMENT_OTHER): Payer: Self-pay | Admitting: Emergency Medicine

## 2023-08-15 ENCOUNTER — Emergency Department (HOSPITAL_BASED_OUTPATIENT_CLINIC_OR_DEPARTMENT_OTHER)
Admission: EM | Admit: 2023-08-15 | Discharge: 2023-08-15 | Disposition: A | Discharge status: Home / Self Care | Payer: Medicare HMO | Attending: Emergency Medicine | Admitting: Emergency Medicine

## 2023-08-15 ENCOUNTER — Telehealth: Payer: Self-pay | Admitting: Family Medicine

## 2023-08-15 DIAGNOSIS — R112 Nausea with vomiting, unspecified: Secondary | ICD-10-CM | POA: Diagnosis not present

## 2023-08-15 DIAGNOSIS — D72819 Decreased white blood cell count, unspecified: Secondary | ICD-10-CM | POA: Insufficient documentation

## 2023-08-15 DIAGNOSIS — E871 Hypo-osmolality and hyponatremia: Secondary | ICD-10-CM | POA: Diagnosis not present

## 2023-08-15 DIAGNOSIS — E878 Other disorders of electrolyte and fluid balance, not elsewhere classified: Secondary | ICD-10-CM | POA: Diagnosis not present

## 2023-08-15 LAB — CBC
HCT: 42.5 % (ref 36.0–46.0)
Hemoglobin: 13.6 g/dL (ref 12.0–15.0)
MCH: 27.9 pg (ref 26.0–34.0)
MCHC: 32 g/dL (ref 30.0–36.0)
MCV: 87.3 fL (ref 80.0–100.0)
Platelets: 161 10*3/uL (ref 150–400)
RBC: 4.87 MIL/uL (ref 3.87–5.11)
RDW: 15 % (ref 11.5–15.5)
WBC: 3.3 10*3/uL — ABNORMAL LOW (ref 4.0–10.5)
nRBC: 0 % (ref 0.0–0.2)

## 2023-08-15 LAB — COMPREHENSIVE METABOLIC PANEL
ALT: 8 U/L (ref 0–44)
AST: 24 U/L (ref 15–41)
Albumin: 3.8 g/dL (ref 3.5–5.0)
Alkaline Phosphatase: 97 U/L (ref 38–126)
Anion gap: 10 (ref 5–15)
BUN: 17 mg/dL (ref 8–23)
CO2: 27 mmol/L (ref 22–32)
Calcium: 9.4 mg/dL (ref 8.9–10.3)
Chloride: 97 mmol/L — ABNORMAL LOW (ref 98–111)
Creatinine, Ser: 0.58 mg/dL (ref 0.44–1.00)
GFR, Estimated: >60 mL/min (ref >60)
Glucose, Bld: 92 mg/dL (ref 70–99)
Potassium: 3.5 mmol/L (ref 3.5–5.1)
Sodium: 134 mmol/L — ABNORMAL LOW (ref 135–145)
Total Bilirubin: 0.5 mg/dL (ref 0.0–1.2)
Total Protein: 7.5 g/dL (ref 6.5–8.1)

## 2023-08-15 LAB — URINALYSIS, ROUTINE W REFLEX MICROSCOPIC
Bacteria, UA: NONE SEEN
Bilirubin Urine: NEGATIVE
Glucose, UA: NEGATIVE mg/dL
Ketones, ur: NEGATIVE mg/dL
Leukocytes,Ua: NEGATIVE
Nitrite: NEGATIVE
Protein, ur: NEGATIVE mg/dL
Specific Gravity, Urine: 1.011 (ref 1.005–1.030)
pH: 5 (ref 5.0–8.0)

## 2023-08-15 LAB — LIPASE, BLOOD: Lipase: 44 U/L (ref 11–51)

## 2023-08-15 MED ORDER — ACETAMINOPHEN 325 MG PO TABS
650.0000 mg | ORAL_TABLET | Freq: Once | ORAL | Status: DC
  Filled 2023-08-15: qty 2

## 2023-08-15 MED ORDER — PROCHLORPERAZINE MALEATE 10 MG PO TABS
5.0000 mg | ORAL_TABLET | Freq: Once | ORAL | Status: DC
  Filled 2023-08-15: qty 1

## 2023-08-15 MED ORDER — ONDANSETRON HCL 4 MG PO TABS: 4.0000 mg | ORAL_TABLET | Freq: Three times a day (TID) | ORAL | 0 refills | Status: DC | PRN

## 2023-08-15 MED ORDER — ENALAPRIL MALEATE 20 MG PO TABS: 30.0000 mg | ORAL_TABLET | Freq: Every day | ORAL | 0 refills | Status: DC

## 2023-08-15 NOTE — Discharge Instructions (Addendum)
Today you are seen for nausea and vomiting.  Please pick up your Zofran and take as needed for nausea and vomiting.  You are found to be hypertensive while in the ED, we have increased your enalapril to 30 mg.  Please pick up your prescription and take as prescribed. Please try to increase your fluid intake to maintain adequate hydration.  Please follow-up with your primary care physician if your symptoms persist.  Thank you for letting us treat you today. After performing a physical exam and reviewing your labs, I feel you are safe to go home. Please follow up with your PCP in the next several days and provide them with your records from this visit. Return to the Emergency Room if pain becomes severe or symptoms worsen.

## 2023-08-15 NOTE — Telephone Encounter (Signed)
Left detailed message for [name] advised of pt approved VO

## 2023-08-15 NOTE — Telephone Encounter (Signed)
Copied from CRM [id number]. Topic: General - Other >> Aug 15, 2023  2:06 PM Theodis Sato wrote: Reason for CRM: Yvonna Alanis from [facility] returning a missed call from [place] but I advised her we did receive the order for speech therapy, she would like to inform Harriett Sine she has what she needs. Yvonna Alanis also states that will be faxing over another order today.

## 2023-08-15 NOTE — ED Provider Notes (Signed)
[facility] EMERGENCY DEPARTMENT AT [facility] Provider Note   CSN: [id number] Arrival date & time: 08/15/23  1625     History  Chief Complaint  Patient presents with   Abdominal Pain    [name] is a 78 y.o. female presents today for nausea, vomiting, decreased oral intake x 1 day.  Patient denies fever, abdominal pain, shortness of breath, weakness, chest pain, dizziness, or headache.  Patient states that she notices that her emesis has been mucousy yellow/brown with a metallic taste.  Patient also has a history of frequent nosebleeds.  Patient denies blood thinner use.   Abdominal Pain Associated symptoms: nausea and vomiting        Home Medications Prior to Admission medications   Medication Sig Start Date End Date Taking? Authorizing Provider  ondansetron (ZOFRAN) 4 MG tablet Take 1 tablet (4 mg total) by mouth every 8 (eight) hours as needed for nausea or vomiting. 08/15/23  Yes Dolphus Jenny, PA-C  acetaminophen (TYLENOL) 325 MG tablet Take 2 tablets (650 mg total) by mouth every 6 (six) hours as needed for mild pain (pain score 1-3) or fever (pain score 1-3 or temp > 100.5). 06/04/23   Hongalgi, Maximino Greenland, MD  docusate sodium (COLACE) 100 MG capsule Take 1 capsule (100 mg total) by mouth 2 (two) times daily. 06/04/23   Hongalgi, Maximino Greenland, MD  enalapril (VASOTEC) 20 MG tablet Take 1.5 tablets (30 mg total) by mouth daily. 08/15/23   Dolphus Jenny, PA-C  gabapentin (NEURONTIN) 300 MG capsule Take 1 capsule (300 mg total) by mouth 3 (three) times daily. 07/15/23   Felecia Shelling, DPM  hydrochlorothiazide (HYDRODIURIL) 25 MG tablet TAKE 1 TABLET BY MOUTH EVERY DAY 07/03/23   Nelwyn Salisbury, MD  oxyCODONE (OXY IR/ROXICODONE) 5 MG immediate release tablet Take 1 tablet (5 mg total) by mouth every 8 (eight) hours as needed for severe pain (pain score 7-10) or moderate pain (pain score 4-6) (pain score 4-6). 06/04/23   Hongalgi, Maximino Greenland, MD  pantoprazole (PROTONIX) 40  MG tablet Take 1 tablet (40 mg total) by mouth daily. 06/05/23   Hongalgi, Maximino Greenland, MD      Allergies    Amoxicillin and Meloxicam    Review of Systems   Review of Systems  Constitutional:  Positive for appetite change.  Gastrointestinal:  Positive for nausea and vomiting.    Physical Exam Updated Vital Signs BP (!) 175/91   Pulse 75   Temp 99.2 F (37.3 C) (Temporal)   Resp 20   SpO2 97%  Physical Exam Vitals and nursing note reviewed.  Constitutional:      General: She is not in acute distress.    Appearance: She is well-developed.  HENT:     Head: Normocephalic and atraumatic.     Mouth/Throat:     Mouth: Mucous membranes are moist.  Eyes:     Conjunctiva/sclera: Conjunctivae normal.  Cardiovascular:     Rate and Rhythm: Normal rate and regular rhythm.     Heart sounds: Normal heart sounds. No murmur heard. Pulmonary:     Effort: Pulmonary effort is normal. No respiratory distress.     Breath sounds: Normal breath sounds.  Abdominal:     General: Bowel sounds are normal.     Palpations: Abdomen is soft.     Tenderness: There is no abdominal tenderness.  Musculoskeletal:        General: No swelling.     Cervical back: Neck supple.  Skin:    General: Skin is warm and dry.     Capillary Refill: Capillary refill takes less than 2 seconds.  Neurological:     Mental Status: She is alert.  Psychiatric:        Mood and Affect: Mood normal.     ED Results / Procedures / Treatments   Labs (all labs ordered are listed, but only abnormal results are displayed) Labs Reviewed  COMPREHENSIVE METABOLIC PANEL - Abnormal; Notable for the following components:      Result Value   Sodium 134 (*)    Chloride 97 (*)    All other components within normal limits  CBC - Abnormal; Notable for the following components:   WBC 3.3 (*)    All other components within normal limits  URINALYSIS, ROUTINE W REFLEX MICROSCOPIC - Abnormal; Notable for the following components:   Hgb  urine dipstick SMALL (*)    All other components within normal limits  LIPASE, BLOOD    EKG None  Radiology No results found.  Procedures Procedures    Medications Ordered in ED Medications  acetaminophen (TYLENOL) tablet 650 mg (0 mg Oral Hold 08/15/23 2003)  prochlorperazine (COMPAZINE) tablet 5 mg (0 mg Oral Hold 08/15/23 2038)    ED Course/ Medical Decision Making/ A&P                                 Medical Decision Making Amount and/or Complexity of Data Reviewed Labs: ordered.   This patient presents to the ED with chief complaint(s) of nausea, vomiting, appetite change with pertinent past medical history of none which further complicates the presenting complaint. The complaint involves an extensive differential diagnosis and also carries with it a high risk of complications and morbidity.    The differential diagnosis includes: Viral illness, SBO, COVID, flu, RSV, URI, GI bleed  Additional history obtained: Records reviewed Primary Care Documents  ED Course and Reassessment:   Independent labs interpretation:  The following labs were independently interpreted:  CBC: Mild leukopenia 3.3 CMP: Mild hyponatremia at 134, mild hypochloremia at 97 Lipase: 44 UA: Small hemoglobi  Consultation: - Consulted or discussed management/test interpretation w/ external professional: None  Consideration for admission or further workup: Consider for mission further workup however patient's vital signs, physical exam, and labs are reassuring.  Patient was able to tolerate p.o. intake without antiemetics prior to discharge.  Patient symptoms likely due to viral illness.  Patient will given short course of Zofran outpatient for nausea and vomiting as needed.  Patient was found to be hypertensive while in the ED, increased patient's enalapril to 30 mg daily.  Patient should follow-up with primary care physician if her symptoms persist for further evaluation and  treatment.        Final Clinical Impression(s) / ED Diagnoses Final diagnoses:  Nausea and vomiting, unspecified vomiting type    Rx / DC Orders ED Discharge Orders          Ordered    enalapril (VASOTEC) 20 MG tablet  Daily        08/15/23 2119    ondansetron (ZOFRAN) 4 MG tablet  Every 8 hours PRN        08/15/23 2119              Dolphus Jenny, PA-C 08/15/23 2120    Rondel Baton, MD 08/19/23 [phone]

## 2023-08-15 NOTE — Telephone Encounter (Signed)
Copied from CRM [id number]. Topic: General - Other >> Aug 15, 2023 11:15 AM [place] A wrote: Reason for CRM: patients husband said patient woke up vomiting, bleeding, and paid. She had hip replacement in December   Chief Complaint: vomiting Symptoms: nausea Frequency: nausea ongoing since yesterday; vomiting 3-4 times today Pertinent Negatives: Patient denies pain, fever Disposition: [] ED /[] Urgent Care (no appt availability in office) / [] Appointment(In office/virtual)/ []  [facility]/ [] Home Care/ [] Refused Recommended Disposition /[] [facility]/ []  Follow-up with PCP Additional Notes: The patient reported 2 days ago her throat was sore.  Yesterday she started feeling nauseated and only ate oatmeal around 4 pm.  She has been drinking very little.  Today the nausea continues and she has thrown up yellow mucus that has a metallic taste 3-4 times.  She feels very weak.  She had a nose bleed due to vomiting.  She denied abdominal pain, fever or diarrhea.  She has a history of surgery on her femur where pins and a rod was placed due to falling and breaking her hip.  She was advised to go to the ED for further evaluation.  She was resistant to the idea but said she would try to get up and get ready to go.  Her husband was on the call too on speaker.        Reason for Disposition  [1] MODERATE vomiting (e.g., 3 - 5 times/day) AND [2] age > 60 years  Answer Assessment - Initial Assessment Questions 1. VOMITING SEVERITY: "How many times have you vomited in the past 24 hours?"     - MILD:  1 - 2 times/day    - MODERATE: 3 - 5 times/day, decreased oral intake without significant weight loss or symptoms of dehydration    - SEVERE: 6 or more times/day, vomits everything or nearly everything, with significant weight loss, symptoms of dehydration      3-4 times 2. ONSET: "When did the vomiting begin?"      Last night felt nauseous  3. FLUIDS: "What fluids or food have you vomited up  today?" "Have you been able to keep any fluids down?"     Able to keep water down; yesterday only ate oatmeal  4. ABDOMEN PAIN: "Are your having any abdomen pain?" If Yes : "How bad is it and what does it feel like?" (e.g., crampy, dull, intermittent, constant)      No pain just nauseous  5. DIARRHEA: "Is there any diarrhea?" If Yes, ask: "How many times today?"      None  6. CONTACTS: "Is there anyone else in the family with the same symptoms?"      No  7. CAUSE: "What do you think is causing your vomiting?"     Unsure possible food poisoning  8. HYDRATION STATUS: "Any signs of dehydration?" (e.g., dry mouth [not only dry lips], too weak to stand) "When did you last urinate?"     Able to urinate  9. OTHER SYMPTOMS: "Do you have any other symptoms?" (e.g., fever, headache, vertigo, vomiting blood or coffee grounds, recent head injury)     Nose bleed due to throwing up  Protocols used: Vomiting-A-AH

## 2023-08-15 NOTE — ED Triage Notes (Signed)
Pt bib wheelchair, c/o n/v today, decreased po intake today. Denies ABD pain or fever

## 2023-08-16 NOTE — Telephone Encounter (Signed)
 Noted

## 2023-08-19 ENCOUNTER — Ambulatory Visit: Payer: Self-pay | Admitting: Family Medicine

## 2023-08-19 NOTE — Telephone Encounter (Signed)
Copied from CRM [id number]. Topic: Clinical - Red Word Triage >> Aug 19, 2023  3:02 PM Chantha C wrote: Red Word that prompted transfer to Nurse Triage: Patient states throwing up yellow mucous last week, went to emergency room, patient has a stomach virus. They said her labs were ok, but husband said there was abnormal. Also, her blood pressure went up to 190 in the hospital. Blood pressure went too low 90/60, making patient really weak, dizzy, light headed, shortness of breath, hard to take a deep breath, and sometimes the food comes up when she's eating. Please advise and call back at [phone].  Chief Complaint: low blood pressure Symptoms: fatigue, lightheadedness Frequency: intermittently since Friday  Pertinent Negatives: Patient denies chest pain Disposition: [] ED /[x] Urgent Care (no appt availability in office) / [] Appointment(In office/virtual)/ []  [place]/ [] Home Care/ [x] Refused Recommended Disposition /[]  Mobile Bus/ []  Follow-up with PCP Additional Notes: The patient reported that she was seen in the emergency room 08/15/23 for nausea and vomiting.  Her blood pressure was elevated while there and her enalapril was increased.  She took her enalapril and hydrochlorothiazide Friday and since then her blood pressure has been low.  It was 90/60 on Friday and she has not taken her hydrochlorothiazide and enalapril since then.  She reported that over the weekend she felt lightheaded and weak and has had shortness of breath with exertion that began when her blood pressure was low Friday.  Today her systolic blood pressure was 115.  She did not recall her diastolic blood pressure or heart rate but she said "it was normal."  Today she is not feeling lightheaded but she continues to feel fatigued.  She reported that she has been drinking plenty of fluids.  She declined to go to the urgent care as Dr. Claris Che earliest available is Wednesday.  She said she sees her hip surgeon  Wednesday so she will just wait to see how she feels.  Routed to pcp for awareness and to advise regarding enalapril dosing.  Reason for Disposition  [1] Systolic BP 90-110 AND [2] taking blood pressure medications AND [3] dizzy, lightheaded or weak  Answer Assessment - Initial Assessment Questions 1. BLOOD PRESSURE: "What is the blood pressure?" "Did you take at least two measurements 5 minutes apart?"     115/unsure  2. ONSET: "When did you take your blood pressure?"     Friday around 11 got home before taking medication SBP 120 then 96/60 Has not taken medication since Friday  3. HOW: "How did you obtain the blood pressure?" (e.g., visiting nurse, automatic home BP monitor)     Automatic  4. HISTORY: "Do you have a history of low blood pressure?" "What is your blood pressure normally?"     Hypertension  5. MEDICINES: "Are you taking any medications for blood pressure?" If Yes, ask: "Have they been changed recently?"     Enalopril, hydrochlrorothiazide 6. PULSE RATE: "Do you know what your pulse rate is?"      Unsure but says it's normal  7. OTHER SYMPTOMS: "Have you been sick recently?" "Have you had a recent injury?"     "Lack of strength"  After eating blood pressure goes up some and goes back down  Answer Assessment - Initial Assessment Questions 1. RESPIRATORY STATUS: "Describe your breathing?" (e.g., wheezing, shortness of breath, unable to speak, severe coughing)      Shortness of breath  2. ONSET: "When did this breathing problem begin?"  This weekend  3. PATTERN "Does the difficult breathing come and go, or has it been constant since it started?"      Comes and goes with exertion  4. SEVERITY: "How bad is your breathing?" (e.g., mild, moderate, severe)    - MILD: No SOB at rest, mild SOB with walking, speaks normally in sentences, can lie down, no retractions, pulse < 100.    - MODERATE: SOB at rest, SOB with minimal exertion and prefers to sit, cannot lie down flat,  speaks in phrases, mild retractions, audible wheezing, pulse 100-120.    - SEVERE: Very SOB at rest, speaks in single words, struggling to breathe, sitting hunched forward, retractions, pulse > 120      With exertion  5. RECURRENT SYMPTOM: "Have you had difficulty breathing before?" If Yes, ask: "When was the last time?" and "What happened that time?"      Unsure  6. CARDIAC HISTORY: "Do you have any history of heart disease?" (e.g., heart attack, angina, bypass surgery, angioplasty)      Patient becoming frustrated and confused by triage questions 7. LUNG HISTORY: "Do you have any history of lung disease?"  (e.g., pulmonary embolus, asthma, emphysema)         Patient becoming frustrated and confused by triage questions 8. CAUSE: "What do you think is causing the breathing problem?"      Unsure  9. OTHER SYMPTOMS: "Do you have any other symptoms? (e.g., dizziness, runny nose, cough, chest pain, fever)     Weakness  Protocols used: Blood Pressure - Low-A-AH, Breathing Difficulty-A-AH

## 2023-08-21 ENCOUNTER — Telehealth: Payer: Self-pay

## 2023-08-21 ENCOUNTER — Other Ambulatory Visit (INDEPENDENT_AMBULATORY_CARE_PROVIDER_SITE_OTHER): Payer: Medicare HMO

## 2023-08-21 ENCOUNTER — Ambulatory Visit: Payer: Medicare HMO | Admitting: Orthopedic Surgery

## 2023-08-21 DIAGNOSIS — S72141A Displaced intertrochanteric fracture of right femur, initial encounter for closed fracture: Secondary | ICD-10-CM | POA: Diagnosis not present

## 2023-08-21 DIAGNOSIS — M79604 Pain in right leg: Secondary | ICD-10-CM | POA: Diagnosis not present

## 2023-08-21 NOTE — Telephone Encounter (Signed)
What is the chance we could get HHPT now for a right IM nail done on 06/01/23 He wants for 6 weeks to work on balance and strengthening

## 2023-08-22 ENCOUNTER — Telehealth: Payer: Self-pay | Admitting: Family Medicine

## 2023-08-22 ENCOUNTER — Encounter: Payer: Self-pay | Admitting: Orthopedic Surgery

## 2023-08-22 NOTE — Telephone Encounter (Signed)
 Disregard

## 2023-08-22 NOTE — Telephone Encounter (Signed)
 Copied from CRM [id number]. Topic: General - Other >> Aug 22, 2023  9:02 AM Franky GRADE wrote: Reason for CRM: Katleen from [facility] health is calling to confirm if a physician order was received for occupational therapy. Please call back to confirm [phone] ext [phone]. She states it was faxed 08/15/2023.  Order number [id number]

## 2023-08-22 NOTE — Progress Notes (Signed)
 Post-Op Visit Note   Patient: [name]           Date of Birth: 1946/05/11           MRN: [id number] Visit Date: 08/21/2023 PCP: Johnny Garnette LABOR, MD   Assessment & Plan:  Chief Complaint:  Chief Complaint  Patient presents with   Right Leg - Routine Post Op    06/01/23 right IM nail for IT femur fracture   Visit Diagnoses:  1. Pain in right leg   2. Closed intertrochanteric fracture of right hip, initial encounter [facility])     Plan: [name] is a 78 year old patient underwent right intertrochanteric fracture fixation with IM nail on 06/01/2023.  She is in physical therapy working with [employer].  She describes some decreased hip flexion range of motion.  On examination she does have a little bit of groin pain with internal/external Tatian of the leg but that is not too surprising based on the amount of moderate arthritis present in the hip prior to her fracture.  Abduction and adduction strength is also very good.  Radiographs show healed fracture.  Plan is physical therapy 1 time a week for the next 6 weeks in terms of home health for balance and strengthening.  She still is not really able to get out on her own.  She will follow-up with us  as needed.  Follow-Up Instructions: No follow-ups on file.   Orders:  Orders Placed This Encounter  Procedures   XR FEMUR, MIN 2 VIEWS RIGHT   No orders of the defined types were placed in this encounter.   Imaging: No results found.  PMFS History: Patient Active Problem List   Diagnosis Date Noted   Prolonged QT interval 06/01/2023   Closed intertrochanteric fracture of right hip, initial encounter (HCC) 06/01/2023   Agatston coronary artery calcium  score greater than 400 12/30/2021   Dysphagia 10/04/2021   Aortic atherosclerosis (HCC) 10/04/2021   Lesion of tonsil 03/15/2020   AKI (acute kidney injury) (HCC) 03/06/2020   CKD stage G2/A2, GFR 60-89 and albumin  creatinine ratio 30-299 mg/g 03/06/2020   Hypomagnesemia 03/06/2020    Polymyalgia rheumatica (HCC) 03/06/2020   Hypercalcemia 03/04/2020   Confusion 03/02/2020   Memory loss 03/02/2020   B12 deficiency 01/09/2019   Vertigo 10/31/2018   Hypokalemia 10/31/2018   Chest pain 10/30/2018   Carpal tunnel syndrome, bilateral 08/11/2018   Cerebral artery occlusion with cerebral infarction (HCC) 11/22/2009   BACK PAIN, CHRONIC 11/22/2009   UNSPECIFIED RETINAL DEFECT 11/08/2009   ROTATOR CUFF INJURY, RIGHT SHOULDER 11/08/2009   BRANCH RETINAL VEIN OCCLUSION 08/12/2007   Essential hypertension 08/12/2007   Past Medical History:  Diagnosis Date   Agatston coronary artery calcium  score greater than 400    827 coronary calcium  score which is 92nd percentile for age, race and sex matched controls   Anemia    Anxiety    Aortic atherosclerosis (HCC)    Arthritis    Backache, unspecified    Blood transfusion without reported diagnosis    Cataract    bil eyes   Depression    Hemiplegia, unspecified, affecting unspecified side    Lacunar infarction (HCC)    Other and unspecified hyperlipidemia    Polymyalgia (HCC)    Retinal defect, unspecified    tear of retina   Rotator cuff injury    right shoulder   Unspecified essential hypertension    Venous tributary (branch) occlusion of retina     Family History  Problem Relation Age  of Onset   Heart disease Mother        died at age 70   Hyperlipidemia Brother    Hypertension Brother    Diabetes Brother    Heart disease Brother        CAD/MI/CABG   Thyroid  disease Daughter    Colon cancer Neg Hx    Esophageal cancer Neg Hx    Rectal cancer Neg Hx    Stomach cancer Neg Hx     Past Surgical History:  Procedure Laterality Date   ABDOMINAL HYSTERECTOMY     CHOLECYSTECTOMY     COLONOSCOPY  01/11/2010   per Dr. Lynwood Bohr, internal hemorrhoids only, repeat in 10 yrs    INTRAMEDULLARY (IM) NAIL INTERTROCHANTERIC Right 06/01/2023   Procedure: INTRAMEDULLARY (IM) NAIL INTERTROCHANTERIC;  Surgeon: Addie Cordella Hamilton, MD;  Location: [facility] OR;  Service: Orthopedics;  Laterality: Right;   SPHINCTEROTOMY     UPPER GASTROINTESTINAL ENDOSCOPY     Social History   Occupational History   Not on file  Tobacco Use   Smoking status: Never   Smokeless tobacco: Never  Vaping Use   Vaping status: Never Used  Substance and Sexual Activity   Alcohol use: No    Alcohol/week: 0.0 standard drinks of alcohol   Drug use: No   Sexual activity: Not Currently

## 2023-08-22 NOTE — Telephone Encounter (Signed)
 Spoke with Katleen from [facility] confirmed one of the fax was received, awaiting for her to fax one more so Dr Alyne Babinski can sign

## 2023-08-23 NOTE — Telephone Encounter (Signed)
Please call her to see how she is doing.

## 2023-08-23 NOTE — Telephone Encounter (Signed)
Called patient left a VM to return call

## 2023-08-26 ENCOUNTER — Ambulatory Visit: Payer: Self-pay | Admitting: Family Medicine

## 2023-08-26 ENCOUNTER — Telehealth: Payer: Self-pay | Admitting: Family Medicine

## 2023-08-26 DIAGNOSIS — Z9181 History of falling: Secondary | ICD-10-CM | POA: Diagnosis not present

## 2023-08-26 DIAGNOSIS — Z96649 Presence of unspecified artificial hip joint: Secondary | ICD-10-CM | POA: Diagnosis not present

## 2023-08-26 DIAGNOSIS — Z8673 Personal history of transient ischemic attack (TIA), and cerebral infarction without residual deficits: Secondary | ICD-10-CM | POA: Diagnosis not present

## 2023-08-26 DIAGNOSIS — I4581 Long QT syndrome: Secondary | ICD-10-CM | POA: Diagnosis not present

## 2023-08-26 DIAGNOSIS — E785 Hyperlipidemia, unspecified: Secondary | ICD-10-CM | POA: Diagnosis not present

## 2023-08-26 DIAGNOSIS — N182 Chronic kidney disease, stage 2 (mild): Secondary | ICD-10-CM | POA: Diagnosis not present

## 2023-08-26 DIAGNOSIS — D62 Acute posthemorrhagic anemia: Secondary | ICD-10-CM | POA: Diagnosis not present

## 2023-08-26 DIAGNOSIS — S72141D Displaced intertrochanteric fracture of right femur, subsequent encounter for closed fracture with routine healing: Secondary | ICD-10-CM | POA: Diagnosis not present

## 2023-08-26 DIAGNOSIS — I129 Hypertensive chronic kidney disease with stage 1 through stage 4 chronic kidney disease, or unspecified chronic kidney disease: Secondary | ICD-10-CM | POA: Diagnosis not present

## 2023-08-26 DIAGNOSIS — R41841 Cognitive communication deficit: Secondary | ICD-10-CM | POA: Diagnosis not present

## 2023-08-26 DIAGNOSIS — W19XXXD Unspecified fall, subsequent encounter: Secondary | ICD-10-CM | POA: Diagnosis not present

## 2023-08-26 DIAGNOSIS — M1990 Unspecified osteoarthritis, unspecified site: Secondary | ICD-10-CM | POA: Diagnosis not present

## 2023-08-26 DIAGNOSIS — E876 Hypokalemia: Secondary | ICD-10-CM | POA: Diagnosis not present

## 2023-08-26 DIAGNOSIS — E559 Vitamin D deficiency, unspecified: Secondary | ICD-10-CM | POA: Diagnosis not present

## 2023-08-26 DIAGNOSIS — M353 Polymyalgia rheumatica: Secondary | ICD-10-CM | POA: Diagnosis not present

## 2023-08-26 DIAGNOSIS — S0003XD Contusion of scalp, subsequent encounter: Secondary | ICD-10-CM | POA: Diagnosis not present

## 2023-08-26 DIAGNOSIS — R1314 Dysphagia, pharyngoesophageal phase: Secondary | ICD-10-CM | POA: Diagnosis not present

## 2023-08-26 NOTE — Telephone Encounter (Signed)
 [name] with HH called and stated she was in home with pt and husband. She is calling with advise if there are any additional advisements from provider based on pt's recent abnormal lab work from 1/31 and Dr. Leonce Ralph advisement for pt to go to Urgent Care. She states she can be advised at: [phone] if any additional advisements are needed.   She also stated if pt needs to pick up any prescriptions to call pt directly.

## 2023-08-26 NOTE — Telephone Encounter (Signed)
  Chief Complaint: ED follow-up; lab follow-up  Additional Notes: Call rec'd from [facility] nurse, Bonnell Butcher re: abnormal labs from 01/30 ED. Per chart review, PCP attempted to contact patient on 02/06 and 2/07. Call transferred tp CAL for further advisement.    Reason for Disposition  [1] Follow-up call from patient regarding patient's clinical status AND [2] information NON-URGENT  Answer Assessment - Initial Assessment Questions 1. REASON FOR CALL or QUESTION: "What is your reason for calling today?" or "How can I best help you?" or "What question do you have that I can help answer?"     [facility] nurse wanting to know if anything needed tobe done for patients abnormal labs  2. CALLER: Document the source of call. (e.g., laboratory, patient).     [facility]  Protocols used: PCP Call - No Triage-A-AH

## 2023-08-28 ENCOUNTER — Telehealth: Payer: Self-pay

## 2023-08-28 DIAGNOSIS — E876 Hypokalemia: Secondary | ICD-10-CM | POA: Diagnosis not present

## 2023-08-28 DIAGNOSIS — N182 Chronic kidney disease, stage 2 (mild): Secondary | ICD-10-CM | POA: Diagnosis not present

## 2023-08-28 DIAGNOSIS — E785 Hyperlipidemia, unspecified: Secondary | ICD-10-CM | POA: Diagnosis not present

## 2023-08-28 DIAGNOSIS — M353 Polymyalgia rheumatica: Secondary | ICD-10-CM | POA: Diagnosis not present

## 2023-08-28 DIAGNOSIS — Z9181 History of falling: Secondary | ICD-10-CM | POA: Diagnosis not present

## 2023-08-28 DIAGNOSIS — S0003XD Contusion of scalp, subsequent encounter: Secondary | ICD-10-CM | POA: Diagnosis not present

## 2023-08-28 DIAGNOSIS — D62 Acute posthemorrhagic anemia: Secondary | ICD-10-CM | POA: Diagnosis not present

## 2023-08-28 DIAGNOSIS — Z96649 Presence of unspecified artificial hip joint: Secondary | ICD-10-CM | POA: Diagnosis not present

## 2023-08-28 DIAGNOSIS — R1314 Dysphagia, pharyngoesophageal phase: Secondary | ICD-10-CM | POA: Diagnosis not present

## 2023-08-28 DIAGNOSIS — S72141D Displaced intertrochanteric fracture of right femur, subsequent encounter for closed fracture with routine healing: Secondary | ICD-10-CM | POA: Diagnosis not present

## 2023-08-28 DIAGNOSIS — E559 Vitamin D deficiency, unspecified: Secondary | ICD-10-CM | POA: Diagnosis not present

## 2023-08-28 DIAGNOSIS — I4581 Long QT syndrome: Secondary | ICD-10-CM | POA: Diagnosis not present

## 2023-08-28 DIAGNOSIS — M1990 Unspecified osteoarthritis, unspecified site: Secondary | ICD-10-CM | POA: Diagnosis not present

## 2023-08-28 DIAGNOSIS — I129 Hypertensive chronic kidney disease with stage 1 through stage 4 chronic kidney disease, or unspecified chronic kidney disease: Secondary | ICD-10-CM | POA: Diagnosis not present

## 2023-08-28 DIAGNOSIS — R41841 Cognitive communication deficit: Secondary | ICD-10-CM | POA: Diagnosis not present

## 2023-08-28 DIAGNOSIS — W19XXXD Unspecified fall, subsequent encounter: Secondary | ICD-10-CM | POA: Diagnosis not present

## 2023-08-28 DIAGNOSIS — Z8673 Personal history of transient ischemic attack (TIA), and cerebral infarction without residual deficits: Secondary | ICD-10-CM | POA: Diagnosis not present

## 2023-08-28 NOTE — Telephone Encounter (Signed)
Copied from CRM [id number]. Topic: Clinical - Medical Advice >> Aug 28, 2023  1:33 PM Corin V wrote: Reason for CRM: Patient's nurse Aletha called while with the patient. Her blood pressure is fluctuating high and then low. Aletha feels that Dr. Clent Ridges needs to reevaluate her blood pressure medications to make sure she is on the correct dosage of both. Agent attempted to schedule patient for acute appointment this week, but patient stated she did not know the other providers and unless Dr. Clent Ridges recommended one she only wanted to be seen by him. Please let her know if Dr. Clent Ridges feels she needs to be seen this week and which provider he recommends.  Aletha also was wondering if she possibly might have a UTI. A UA was done in the hospital on 1/30. Please review those results and call patient back since she wants to get a better understanding of those.

## 2023-08-29 DIAGNOSIS — W19XXXD Unspecified fall, subsequent encounter: Secondary | ICD-10-CM | POA: Diagnosis not present

## 2023-08-29 DIAGNOSIS — E559 Vitamin D deficiency, unspecified: Secondary | ICD-10-CM | POA: Diagnosis not present

## 2023-08-29 DIAGNOSIS — S0003XD Contusion of scalp, subsequent encounter: Secondary | ICD-10-CM | POA: Diagnosis not present

## 2023-08-29 DIAGNOSIS — E785 Hyperlipidemia, unspecified: Secondary | ICD-10-CM | POA: Diagnosis not present

## 2023-08-29 DIAGNOSIS — N182 Chronic kidney disease, stage 2 (mild): Secondary | ICD-10-CM | POA: Diagnosis not present

## 2023-08-29 DIAGNOSIS — M353 Polymyalgia rheumatica: Secondary | ICD-10-CM | POA: Diagnosis not present

## 2023-08-29 DIAGNOSIS — I4581 Long QT syndrome: Secondary | ICD-10-CM | POA: Diagnosis not present

## 2023-08-29 DIAGNOSIS — D62 Acute posthemorrhagic anemia: Secondary | ICD-10-CM | POA: Diagnosis not present

## 2023-08-29 DIAGNOSIS — R41841 Cognitive communication deficit: Secondary | ICD-10-CM | POA: Diagnosis not present

## 2023-08-29 DIAGNOSIS — Z8673 Personal history of transient ischemic attack (TIA), and cerebral infarction without residual deficits: Secondary | ICD-10-CM | POA: Diagnosis not present

## 2023-08-29 DIAGNOSIS — I129 Hypertensive chronic kidney disease with stage 1 through stage 4 chronic kidney disease, or unspecified chronic kidney disease: Secondary | ICD-10-CM | POA: Diagnosis not present

## 2023-08-29 DIAGNOSIS — S72141D Displaced intertrochanteric fracture of right femur, subsequent encounter for closed fracture with routine healing: Secondary | ICD-10-CM | POA: Diagnosis not present

## 2023-08-29 DIAGNOSIS — Z96649 Presence of unspecified artificial hip joint: Secondary | ICD-10-CM | POA: Diagnosis not present

## 2023-08-29 DIAGNOSIS — R1314 Dysphagia, pharyngoesophageal phase: Secondary | ICD-10-CM | POA: Diagnosis not present

## 2023-08-29 DIAGNOSIS — Z9181 History of falling: Secondary | ICD-10-CM | POA: Diagnosis not present

## 2023-08-29 DIAGNOSIS — E876 Hypokalemia: Secondary | ICD-10-CM | POA: Diagnosis not present

## 2023-08-29 DIAGNOSIS — M1990 Unspecified osteoarthritis, unspecified site: Secondary | ICD-10-CM | POA: Diagnosis not present

## 2023-08-30 ENCOUNTER — Telehealth: Payer: Self-pay | Admitting: Family Medicine

## 2023-08-30 NOTE — Telephone Encounter (Signed)
Copied from CRM [id number]. Topic: General - Other >> Aug 30, 2023  2:37 PM Gurney Maxin H wrote: Reason for CRM: Yvonna Alanis calling following up on 6 orders 2 are add on discipline and 4 are physician orders to make sure office received orders. Order numbers [id number], [id number], [id number], [id number], [id number], [phone]  Kaitlyn [phone] Ext [phone]

## 2023-09-02 DIAGNOSIS — Z9181 History of falling: Secondary | ICD-10-CM | POA: Diagnosis not present

## 2023-09-02 DIAGNOSIS — Z96649 Presence of unspecified artificial hip joint: Secondary | ICD-10-CM | POA: Diagnosis not present

## 2023-09-02 DIAGNOSIS — M353 Polymyalgia rheumatica: Secondary | ICD-10-CM | POA: Diagnosis not present

## 2023-09-02 DIAGNOSIS — I129 Hypertensive chronic kidney disease with stage 1 through stage 4 chronic kidney disease, or unspecified chronic kidney disease: Secondary | ICD-10-CM | POA: Diagnosis not present

## 2023-09-02 DIAGNOSIS — I4581 Long QT syndrome: Secondary | ICD-10-CM | POA: Diagnosis not present

## 2023-09-02 DIAGNOSIS — W19XXXD Unspecified fall, subsequent encounter: Secondary | ICD-10-CM | POA: Diagnosis not present

## 2023-09-02 DIAGNOSIS — S0003XD Contusion of scalp, subsequent encounter: Secondary | ICD-10-CM | POA: Diagnosis not present

## 2023-09-02 DIAGNOSIS — R41841 Cognitive communication deficit: Secondary | ICD-10-CM | POA: Diagnosis not present

## 2023-09-02 DIAGNOSIS — M1990 Unspecified osteoarthritis, unspecified site: Secondary | ICD-10-CM | POA: Diagnosis not present

## 2023-09-02 DIAGNOSIS — S72141D Displaced intertrochanteric fracture of right femur, subsequent encounter for closed fracture with routine healing: Secondary | ICD-10-CM | POA: Diagnosis not present

## 2023-09-02 DIAGNOSIS — N182 Chronic kidney disease, stage 2 (mild): Secondary | ICD-10-CM | POA: Diagnosis not present

## 2023-09-02 DIAGNOSIS — E785 Hyperlipidemia, unspecified: Secondary | ICD-10-CM | POA: Diagnosis not present

## 2023-09-02 DIAGNOSIS — R1314 Dysphagia, pharyngoesophageal phase: Secondary | ICD-10-CM | POA: Diagnosis not present

## 2023-09-02 DIAGNOSIS — D62 Acute posthemorrhagic anemia: Secondary | ICD-10-CM | POA: Diagnosis not present

## 2023-09-02 DIAGNOSIS — Z8673 Personal history of transient ischemic attack (TIA), and cerebral infarction without residual deficits: Secondary | ICD-10-CM | POA: Diagnosis not present

## 2023-09-02 DIAGNOSIS — E559 Vitamin D deficiency, unspecified: Secondary | ICD-10-CM | POA: Diagnosis not present

## 2023-09-02 DIAGNOSIS — E876 Hypokalemia: Secondary | ICD-10-CM | POA: Diagnosis not present

## 2023-09-02 NOTE — Telephone Encounter (Signed)
 FYI Pt has appointment tomorrow 09/03/23

## 2023-09-02 NOTE — Telephone Encounter (Signed)
 Pt has OV with Dr Clent Ridges on 09/03/23

## 2023-09-02 NOTE — Telephone Encounter (Signed)
 Pt is scheduled for office visit with Dr Clent Ridges on 09/03/23

## 2023-09-02 NOTE — Telephone Encounter (Signed)
 Appointment for 09/03/23

## 2023-09-03 ENCOUNTER — Ambulatory Visit: Payer: Medicare HMO | Admitting: Family Medicine

## 2023-09-03 NOTE — Telephone Encounter (Signed)
We will see her today

## 2023-09-03 NOTE — Telephone Encounter (Signed)
 I will see her today

## 2023-09-05 NOTE — Telephone Encounter (Signed)
 Pt orders have all been faxed to [facility] and confirmations received

## 2023-09-09 ENCOUNTER — Telehealth: Payer: Self-pay | Admitting: Family Medicine

## 2023-09-09 DIAGNOSIS — W19XXXD Unspecified fall, subsequent encounter: Secondary | ICD-10-CM | POA: Diagnosis not present

## 2023-09-09 DIAGNOSIS — D62 Acute posthemorrhagic anemia: Secondary | ICD-10-CM | POA: Diagnosis not present

## 2023-09-09 DIAGNOSIS — M1990 Unspecified osteoarthritis, unspecified site: Secondary | ICD-10-CM | POA: Diagnosis not present

## 2023-09-09 DIAGNOSIS — Z96649 Presence of unspecified artificial hip joint: Secondary | ICD-10-CM | POA: Diagnosis not present

## 2023-09-09 DIAGNOSIS — Z8673 Personal history of transient ischemic attack (TIA), and cerebral infarction without residual deficits: Secondary | ICD-10-CM | POA: Diagnosis not present

## 2023-09-09 DIAGNOSIS — I129 Hypertensive chronic kidney disease with stage 1 through stage 4 chronic kidney disease, or unspecified chronic kidney disease: Secondary | ICD-10-CM | POA: Diagnosis not present

## 2023-09-09 DIAGNOSIS — E559 Vitamin D deficiency, unspecified: Secondary | ICD-10-CM | POA: Diagnosis not present

## 2023-09-09 DIAGNOSIS — E876 Hypokalemia: Secondary | ICD-10-CM | POA: Diagnosis not present

## 2023-09-09 DIAGNOSIS — I4581 Long QT syndrome: Secondary | ICD-10-CM | POA: Diagnosis not present

## 2023-09-09 DIAGNOSIS — S72141D Displaced intertrochanteric fracture of right femur, subsequent encounter for closed fracture with routine healing: Secondary | ICD-10-CM | POA: Diagnosis not present

## 2023-09-09 DIAGNOSIS — M353 Polymyalgia rheumatica: Secondary | ICD-10-CM | POA: Diagnosis not present

## 2023-09-09 DIAGNOSIS — R41841 Cognitive communication deficit: Secondary | ICD-10-CM | POA: Diagnosis not present

## 2023-09-09 DIAGNOSIS — Z9181 History of falling: Secondary | ICD-10-CM | POA: Diagnosis not present

## 2023-09-09 DIAGNOSIS — E785 Hyperlipidemia, unspecified: Secondary | ICD-10-CM | POA: Diagnosis not present

## 2023-09-09 DIAGNOSIS — N182 Chronic kidney disease, stage 2 (mild): Secondary | ICD-10-CM | POA: Diagnosis not present

## 2023-09-09 DIAGNOSIS — R1314 Dysphagia, pharyngoesophageal phase: Secondary | ICD-10-CM | POA: Diagnosis not present

## 2023-09-09 DIAGNOSIS — S0003XD Contusion of scalp, subsequent encounter: Secondary | ICD-10-CM | POA: Diagnosis not present

## 2023-09-09 NOTE — Telephone Encounter (Signed)
 Copied from CRM [id number]. Topic: Medical Record Request - Other >> Sep 09, 2023  3:34 PM Arm[place] J wrote: Reason for CRM: Yvonna Alanis from [facility] wanted to know if orders were ever confirmed. I notified the representative that orders were confirmed and sent back via fax. She did state that nothing was received and provided me with a good fax number: [phone]

## 2023-09-10 ENCOUNTER — Telehealth: Payer: Self-pay

## 2023-09-10 DIAGNOSIS — M1990 Unspecified osteoarthritis, unspecified site: Secondary | ICD-10-CM | POA: Diagnosis not present

## 2023-09-10 DIAGNOSIS — W19XXXD Unspecified fall, subsequent encounter: Secondary | ICD-10-CM | POA: Diagnosis not present

## 2023-09-10 DIAGNOSIS — Z96649 Presence of unspecified artificial hip joint: Secondary | ICD-10-CM | POA: Diagnosis not present

## 2023-09-10 DIAGNOSIS — I129 Hypertensive chronic kidney disease with stage 1 through stage 4 chronic kidney disease, or unspecified chronic kidney disease: Secondary | ICD-10-CM | POA: Diagnosis not present

## 2023-09-10 DIAGNOSIS — E559 Vitamin D deficiency, unspecified: Secondary | ICD-10-CM | POA: Diagnosis not present

## 2023-09-10 DIAGNOSIS — D62 Acute posthemorrhagic anemia: Secondary | ICD-10-CM | POA: Diagnosis not present

## 2023-09-10 DIAGNOSIS — R1314 Dysphagia, pharyngoesophageal phase: Secondary | ICD-10-CM | POA: Diagnosis not present

## 2023-09-10 DIAGNOSIS — I4581 Long QT syndrome: Secondary | ICD-10-CM | POA: Diagnosis not present

## 2023-09-10 DIAGNOSIS — Z9181 History of falling: Secondary | ICD-10-CM | POA: Diagnosis not present

## 2023-09-10 DIAGNOSIS — Z8673 Personal history of transient ischemic attack (TIA), and cerebral infarction without residual deficits: Secondary | ICD-10-CM | POA: Diagnosis not present

## 2023-09-10 DIAGNOSIS — R41841 Cognitive communication deficit: Secondary | ICD-10-CM | POA: Diagnosis not present

## 2023-09-10 DIAGNOSIS — S0003XD Contusion of scalp, subsequent encounter: Secondary | ICD-10-CM | POA: Diagnosis not present

## 2023-09-10 DIAGNOSIS — M353 Polymyalgia rheumatica: Secondary | ICD-10-CM | POA: Diagnosis not present

## 2023-09-10 DIAGNOSIS — S72141D Displaced intertrochanteric fracture of right femur, subsequent encounter for closed fracture with routine healing: Secondary | ICD-10-CM | POA: Diagnosis not present

## 2023-09-10 DIAGNOSIS — E876 Hypokalemia: Secondary | ICD-10-CM | POA: Diagnosis not present

## 2023-09-10 DIAGNOSIS — E785 Hyperlipidemia, unspecified: Secondary | ICD-10-CM | POA: Diagnosis not present

## 2023-09-10 DIAGNOSIS — N182 Chronic kidney disease, stage 2 (mild): Secondary | ICD-10-CM | POA: Diagnosis not present

## 2023-09-10 NOTE — Telephone Encounter (Signed)
 Copied from CRM [id number]. Topic: General - Other >> Sep 10, 2023  1:56 PM Desma Mcgregor wrote: Reason for CRM: Wilkie Aye advising to dr that Pt had a fall on Sunday 2/23 with no reports of injury. If further instructions are required, she is asking that you contact her directly.

## 2023-09-11 DIAGNOSIS — M1990 Unspecified osteoarthritis, unspecified site: Secondary | ICD-10-CM | POA: Diagnosis not present

## 2023-09-11 DIAGNOSIS — N182 Chronic kidney disease, stage 2 (mild): Secondary | ICD-10-CM | POA: Diagnosis not present

## 2023-09-11 DIAGNOSIS — Z9181 History of falling: Secondary | ICD-10-CM | POA: Diagnosis not present

## 2023-09-11 DIAGNOSIS — D62 Acute posthemorrhagic anemia: Secondary | ICD-10-CM | POA: Diagnosis not present

## 2023-09-11 DIAGNOSIS — Z96649 Presence of unspecified artificial hip joint: Secondary | ICD-10-CM | POA: Diagnosis not present

## 2023-09-11 DIAGNOSIS — R41841 Cognitive communication deficit: Secondary | ICD-10-CM | POA: Diagnosis not present

## 2023-09-11 DIAGNOSIS — R1314 Dysphagia, pharyngoesophageal phase: Secondary | ICD-10-CM | POA: Diagnosis not present

## 2023-09-11 DIAGNOSIS — W19XXXD Unspecified fall, subsequent encounter: Secondary | ICD-10-CM | POA: Diagnosis not present

## 2023-09-11 DIAGNOSIS — E559 Vitamin D deficiency, unspecified: Secondary | ICD-10-CM | POA: Diagnosis not present

## 2023-09-11 DIAGNOSIS — S0003XD Contusion of scalp, subsequent encounter: Secondary | ICD-10-CM | POA: Diagnosis not present

## 2023-09-11 DIAGNOSIS — M353 Polymyalgia rheumatica: Secondary | ICD-10-CM | POA: Diagnosis not present

## 2023-09-11 DIAGNOSIS — I129 Hypertensive chronic kidney disease with stage 1 through stage 4 chronic kidney disease, or unspecified chronic kidney disease: Secondary | ICD-10-CM | POA: Diagnosis not present

## 2023-09-11 DIAGNOSIS — Z8673 Personal history of transient ischemic attack (TIA), and cerebral infarction without residual deficits: Secondary | ICD-10-CM | POA: Diagnosis not present

## 2023-09-11 DIAGNOSIS — E876 Hypokalemia: Secondary | ICD-10-CM | POA: Diagnosis not present

## 2023-09-11 DIAGNOSIS — S72141D Displaced intertrochanteric fracture of right femur, subsequent encounter for closed fracture with routine healing: Secondary | ICD-10-CM | POA: Diagnosis not present

## 2023-09-11 DIAGNOSIS — I4581 Long QT syndrome: Secondary | ICD-10-CM | POA: Diagnosis not present

## 2023-09-11 DIAGNOSIS — E785 Hyperlipidemia, unspecified: Secondary | ICD-10-CM | POA: Diagnosis not present

## 2023-09-11 NOTE — Telephone Encounter (Signed)
 Noted.

## 2023-09-12 NOTE — Telephone Encounter (Signed)
Will re fax orders.

## 2023-09-13 ENCOUNTER — Telehealth: Payer: Self-pay

## 2023-09-13 NOTE — Telephone Encounter (Signed)
 Copied from CRM [id number]. Topic: General - Other >> Sep 13, 2023  2:03 PM Almira Coaster wrote: Reason for CRM: Yvonna Alanis from [facility] home health is calling to advise that she is missing order (order number [id number]) if we can please refax just that order back to her. Fax number  [phone]. She also stated additional orders were sent on 09/09/2023 and wanted to confirm if they were received. Order numbers [id number] & [id number]. Call back number [phone] ext [phone].

## 2023-09-16 NOTE — Telephone Encounter (Signed)
 Left detailed message for Kaitlyn with [facility], advised that pt orders were faxed on Friday. Advised to contact the office with any concern

## 2023-09-16 NOTE — Telephone Encounter (Signed)
 Orders faxed

## 2023-09-18 ENCOUNTER — Ambulatory Visit: Payer: Self-pay | Admitting: Family Medicine

## 2023-09-18 ENCOUNTER — Telehealth: Payer: Self-pay

## 2023-09-18 DIAGNOSIS — I4581 Long QT syndrome: Secondary | ICD-10-CM | POA: Diagnosis not present

## 2023-09-18 DIAGNOSIS — E785 Hyperlipidemia, unspecified: Secondary | ICD-10-CM | POA: Diagnosis not present

## 2023-09-18 DIAGNOSIS — E559 Vitamin D deficiency, unspecified: Secondary | ICD-10-CM | POA: Diagnosis not present

## 2023-09-18 DIAGNOSIS — R1314 Dysphagia, pharyngoesophageal phase: Secondary | ICD-10-CM | POA: Diagnosis not present

## 2023-09-18 DIAGNOSIS — W19XXXD Unspecified fall, subsequent encounter: Secondary | ICD-10-CM | POA: Diagnosis not present

## 2023-09-18 DIAGNOSIS — Z96649 Presence of unspecified artificial hip joint: Secondary | ICD-10-CM | POA: Diagnosis not present

## 2023-09-18 DIAGNOSIS — M353 Polymyalgia rheumatica: Secondary | ICD-10-CM | POA: Diagnosis not present

## 2023-09-18 DIAGNOSIS — E876 Hypokalemia: Secondary | ICD-10-CM | POA: Diagnosis not present

## 2023-09-18 DIAGNOSIS — D62 Acute posthemorrhagic anemia: Secondary | ICD-10-CM | POA: Diagnosis not present

## 2023-09-18 DIAGNOSIS — I129 Hypertensive chronic kidney disease with stage 1 through stage 4 chronic kidney disease, or unspecified chronic kidney disease: Secondary | ICD-10-CM | POA: Diagnosis not present

## 2023-09-18 DIAGNOSIS — M1990 Unspecified osteoarthritis, unspecified site: Secondary | ICD-10-CM | POA: Diagnosis not present

## 2023-09-18 DIAGNOSIS — S0003XD Contusion of scalp, subsequent encounter: Secondary | ICD-10-CM | POA: Diagnosis not present

## 2023-09-18 DIAGNOSIS — R41841 Cognitive communication deficit: Secondary | ICD-10-CM | POA: Diagnosis not present

## 2023-09-18 DIAGNOSIS — N182 Chronic kidney disease, stage 2 (mild): Secondary | ICD-10-CM | POA: Diagnosis not present

## 2023-09-18 DIAGNOSIS — Z8673 Personal history of transient ischemic attack (TIA), and cerebral infarction without residual deficits: Secondary | ICD-10-CM | POA: Diagnosis not present

## 2023-09-18 DIAGNOSIS — Z9181 History of falling: Secondary | ICD-10-CM | POA: Diagnosis not present

## 2023-09-18 DIAGNOSIS — S72141D Displaced intertrochanteric fracture of right femur, subsequent encounter for closed fracture with routine healing: Secondary | ICD-10-CM | POA: Diagnosis not present

## 2023-09-18 NOTE — Telephone Encounter (Signed)
 Copied from CRM [id number]. Topic: Clinical - Home Health Verbal Orders >> Sep 18, 2023 12:08 PM Dimitri Ped wrote: Caller/Agency: ana steed/[facility] Callback Number: [phone] Service Requested: Physical Therapy Frequency: 1 week 8  Any new concerns about the patient? Yes the second fall she had in a couple weeks. She fell backwards that was Sunday march the 2nd . She hit her head coccyx bone tail bone . The tail bone is hurting and she needs to do the xray. Not sure if that's right thing to do . Dont know if dr fry will have to send a referral in or to the ortho . She want to come to see dr fry or wants whatever he think is better

## 2023-09-18 NOTE — Telephone Encounter (Signed)
 Copied from CRM [id number]. Topic: Clinical - Red Word Triage >> Sep 18, 2023 12:14 PM Whitney O wrote: [facility] that prompted transfer to Nurse Triage: patient had a fall on Sunday and tail boine hurts wants a xray done . The [facility] lady is with her and on the phone . Ms ana  Chief Complaint: fall; hit head and tailbone; extreme pain Symptoms: see above Frequency: continous Pertinent Negatives: Patient denies sob, cp Disposition: [x] ED /[] Urgent Care (no appt availability in office) / [] Appointment(In office/virtual)/ []  [facility]/ [] Home Care/ [] Refused Recommended Disposition /[] [facility]/ []  Follow-up with PCP Additional Notes: fell and hit head and tailbone in bathroom.  Er per protocol, patient declines and would like a follow up with pcp.  Office updated.  Patient did not experience dizziness prior to fall but experienced it post fall.  Hx CVA.   Reason for Disposition  Injury (or injuries) that need emergency care  Answer Assessment - Initial Assessment Questions 1. MECHANISM: "How did the fall happen?"     Was in the bathroom and was tired, leaning back in to the counter and twisted her feet and lost balance and fell backwards and hit head on the floor.  2. DOMESTIC VIOLENCE AND ELDER ABUSE SCREENING: "Did you fall because someone pushed you or tried to hurt you?" If Yes, ask: "Are you safe now?"     denies 3. ONSET: "When did the fall happen?" (e.g., minutes, hours, or days ago)     Sunday 4. LOCATION: "What part of the body hit the ground?" (e.g., back, buttocks, head, hips, knees, hands, head, stomach)     Butt and head 5. INJURY: "Did you hurt (injure) yourself when you fell?" If Yes, ask: "What did you injure? Tell me more about this?" (e.g., body area; type of injury; pain severity)"     Tailbone hurts 6. PAIN: "Is there any pain?" If Yes, ask: "How bad is the pain?" (e.g., Scale 1-10; or mild,  moderate, severe)   - NONE (0): No pain    - MILD (1-3): Doesn't interfere with normal activities    - MODERATE (4-7): Interferes with normal activities or awakens from sleep    - SEVERE (8-10): Excruciating pain, unable to do any normal activities      Denies pain at this time, sitting on buttock to avoid pressure to tail bone 7. SIZE: For cuts, bruises, or swelling, ask: "How large is it?" (e.g., inches or centimeters)      denies 8. PREGNANCY: "Is there any chance you are pregnant?" "When was your last menstrual period?"     na 9. OTHER SYMPTOMS: "Do you have any other symptoms?" (e.g., dizziness, fever, weakness; new onset or worsening).      Dizzy 10. CAUSE: "What do you think caused the fall (or falling)?" (e.g., tripped, dizzy spell)       Feet tangled.  Protocols used: Falls and [facility]

## 2023-09-18 NOTE — Telephone Encounter (Signed)
 Attempted to call pt back to give Dr Clent Ridges Advise with no success, spoke with pt spouse left a message to have pt call the office in the morning.

## 2023-09-18 NOTE — Telephone Encounter (Signed)
 I wouldn't be able to see her until Friday afternoon. I advise that she go to an urgent care (not the ED) so she can get Xrays there

## 2023-09-19 NOTE — Telephone Encounter (Signed)
 2 nd Attempted to call pt today no success of speaking with pt

## 2023-09-20 NOTE — Telephone Encounter (Signed)
 Spoke with [facility] nurse advised that our office has tried to call pt with no success. Advised to ask pt to contact the office and schedule appointment with Dr Clent Ridges

## 2023-09-24 ENCOUNTER — Ambulatory Visit: Payer: Self-pay | Admitting: Family Medicine

## 2023-09-24 ENCOUNTER — Encounter: Payer: Self-pay | Admitting: Family Medicine

## 2023-09-24 ENCOUNTER — Ambulatory Visit (INDEPENDENT_AMBULATORY_CARE_PROVIDER_SITE_OTHER)
Admission: RE | Admit: 2023-09-24 | Discharge: 2023-09-24 | Disposition: A | Discharge status: Home / Self Care | Source: Ambulatory Visit | Attending: Family Medicine | Admitting: Family Medicine

## 2023-09-24 ENCOUNTER — Ambulatory Visit (INDEPENDENT_AMBULATORY_CARE_PROVIDER_SITE_OTHER): Admitting: Family Medicine

## 2023-09-24 VITALS — BP 128/60 | HR 72 | Temp 97.6°F | Wt 133.2 lb

## 2023-09-24 DIAGNOSIS — S300XXA Contusion of lower back and pelvis, initial encounter: Secondary | ICD-10-CM

## 2023-09-24 DIAGNOSIS — M533 Sacrococcygeal disorders, not elsewhere classified: Secondary | ICD-10-CM | POA: Diagnosis not present

## 2023-09-24 NOTE — Telephone Encounter (Signed)
 Copied from CRM [id number]. Topic: Clinical - Red Word Triage >> Sep 24, 2023  7:57 AM Theodis Sato wrote: Red Word that prompted transfer to Nurse Triage: Swelling and pain on  tailbone from fall taken 3/2 Patient is seeking a Xray.   Chief Complaint: back pain Symptoms: Tailbone injury Frequency: intermittent Pertinent Negatives: Patient denies numbness Disposition: [] ED /[] Urgent Care (no appt availability in office) / [x] Appointment(In office/virtual)/ []  [place] [facility]/ [] Home Care/ [] Refused Recommended Disposition /[] [facility] [facility]/ []  Follow-up with PCP Additional Notes: Pt fell 9 days ago, feels okay when walking but has some pain when trying to sit. Appt scheduled for today at 1:15pm.   Reason for Disposition  [1] MODERATE pain (e.g., interferes with normal activities) AND [2] high-risk adult (e.g., age > 60 years, osteoporosis, chronic steroid use)  Answer Assessment - Initial Assessment Questions 1. MECHANISM: "How did the injury happen?"       Slip and fall at home  2. ONSET: "When did the injury happen?" (Minutes or hours ago)      3/2- 9 days ago  3. LOCATION: "Where is the injury located?"      Tailbone  4. SEVERITY: "Can you sit?" "Can you walk?"      Can walk okay, but has to be careful how she sits  5. PAIN: "Is there pain?" If Yes, ask: "How bad is the pain?" (e.g., Scale 1-10; mild, moderate, or severe)   - MILD (1-3): doesn't interfere with normal activities    - MODERATE (4-7): interferes with normal activities or awakens from sleep    - SEVERE (8-10): excruciating pain, unable to do any normal activities      No pain unless with certain movements, then pain is mild to moderate  6. SIZE: For bruises, or swelling, ask: "How large is it?" (e.g., inches or centimeters)      Unsure of bruise or swelling  7. OTHER SYMPTOMS: "Do you have any other symptoms?" (e.g., numbness, back pain)     No  8. PREGNANCY: "Is there any chance you are pregnant?"  "When was your last menstrual period?"     No  Protocols used: Tailbone Injury-A-AH

## 2023-09-24 NOTE — Progress Notes (Signed)
   Subjective:    Patient ID: [name], female    DOB: [birth year], 78 y.o.   MRN: [id number]  HPI Here for an injury that occurred at home about 10 days ago. She was in her bathroom, and when she turned to the side she lost her balance and fell. She landed in a sitting position on the floor. No other injuries. She has had pain in the sacral area since then, but this is getting better every day. At first moving at all caused her pain, but now she can sit and walk and lie in bed without pain. She still has pain when she gets up from a chair or sits down. No pain in the legs. Tylenol helps.    Review of Systems  Constitutional: Negative.   Respiratory: Negative.    Cardiovascular: Negative.   Musculoskeletal:  Positive for back pain.       Objective:   Physical Exam Constitutional:      Comments: Walks with rolling walker   Cardiovascular:     Rate and Rhythm: Normal rate and regular rhythm.     Pulses: Normal pulses.     Heart sounds: Normal heart sounds.  Pulmonary:     Effort: Pulmonary effort is normal.     Breath sounds: Normal breath sounds.  Musculoskeletal:     Comments: She is tender over the sacrum and the coccyx. No crepitus. Her spine has full ROM   Neurological:     Mental Status: She is alert and oriented to person, place, and time.           Assessment & Plan:  Sacral contusion. We will get Xrays but I think a fracture is unlikely, given how rapidly the pain is improving. She can take Tylenol as needed.  Gershon Crane, MD

## 2023-09-24 NOTE — Telephone Encounter (Signed)
 FYI Pt has appointment this afternoon with Dr Clent Ridges

## 2023-09-24 NOTE — Telephone Encounter (Signed)
Pt has appointment this afternoon.

## 2023-09-26 DIAGNOSIS — I4581 Long QT syndrome: Secondary | ICD-10-CM | POA: Diagnosis not present

## 2023-09-26 DIAGNOSIS — I952 Hypotension due to drugs: Secondary | ICD-10-CM | POA: Diagnosis not present

## 2023-09-26 DIAGNOSIS — M1990 Unspecified osteoarthritis, unspecified site: Secondary | ICD-10-CM | POA: Diagnosis not present

## 2023-09-26 DIAGNOSIS — S72141D Displaced intertrochanteric fracture of right femur, subsequent encounter for closed fracture with routine healing: Secondary | ICD-10-CM | POA: Diagnosis not present

## 2023-09-26 DIAGNOSIS — Z96649 Presence of unspecified artificial hip joint: Secondary | ICD-10-CM | POA: Diagnosis not present

## 2023-09-26 DIAGNOSIS — R41841 Cognitive communication deficit: Secondary | ICD-10-CM | POA: Diagnosis not present

## 2023-09-26 DIAGNOSIS — E559 Vitamin D deficiency, unspecified: Secondary | ICD-10-CM | POA: Diagnosis not present

## 2023-09-26 DIAGNOSIS — Z8673 Personal history of transient ischemic attack (TIA), and cerebral infarction without residual deficits: Secondary | ICD-10-CM | POA: Diagnosis not present

## 2023-09-26 DIAGNOSIS — I129 Hypertensive chronic kidney disease with stage 1 through stage 4 chronic kidney disease, or unspecified chronic kidney disease: Secondary | ICD-10-CM | POA: Diagnosis not present

## 2023-09-26 DIAGNOSIS — M353 Polymyalgia rheumatica: Secondary | ICD-10-CM | POA: Diagnosis not present

## 2023-09-26 DIAGNOSIS — W19XXXD Unspecified fall, subsequent encounter: Secondary | ICD-10-CM | POA: Diagnosis not present

## 2023-09-26 DIAGNOSIS — E785 Hyperlipidemia, unspecified: Secondary | ICD-10-CM | POA: Diagnosis not present

## 2023-09-26 DIAGNOSIS — N182 Chronic kidney disease, stage 2 (mild): Secondary | ICD-10-CM | POA: Diagnosis not present

## 2023-09-26 DIAGNOSIS — Z9181 History of falling: Secondary | ICD-10-CM | POA: Diagnosis not present

## 2023-09-26 DIAGNOSIS — R1314 Dysphagia, pharyngoesophageal phase: Secondary | ICD-10-CM | POA: Diagnosis not present

## 2023-10-03 DIAGNOSIS — Z8673 Personal history of transient ischemic attack (TIA), and cerebral infarction without residual deficits: Secondary | ICD-10-CM | POA: Diagnosis not present

## 2023-10-03 DIAGNOSIS — Z9181 History of falling: Secondary | ICD-10-CM | POA: Diagnosis not present

## 2023-10-03 DIAGNOSIS — I129 Hypertensive chronic kidney disease with stage 1 through stage 4 chronic kidney disease, or unspecified chronic kidney disease: Secondary | ICD-10-CM | POA: Diagnosis not present

## 2023-10-03 DIAGNOSIS — M1990 Unspecified osteoarthritis, unspecified site: Secondary | ICD-10-CM | POA: Diagnosis not present

## 2023-10-03 DIAGNOSIS — W19XXXD Unspecified fall, subsequent encounter: Secondary | ICD-10-CM | POA: Diagnosis not present

## 2023-10-03 DIAGNOSIS — S72141D Displaced intertrochanteric fracture of right femur, subsequent encounter for closed fracture with routine healing: Secondary | ICD-10-CM | POA: Diagnosis not present

## 2023-10-03 DIAGNOSIS — E559 Vitamin D deficiency, unspecified: Secondary | ICD-10-CM | POA: Diagnosis not present

## 2023-10-03 DIAGNOSIS — I952 Hypotension due to drugs: Secondary | ICD-10-CM | POA: Diagnosis not present

## 2023-10-03 DIAGNOSIS — Z96649 Presence of unspecified artificial hip joint: Secondary | ICD-10-CM | POA: Diagnosis not present

## 2023-10-03 DIAGNOSIS — N182 Chronic kidney disease, stage 2 (mild): Secondary | ICD-10-CM | POA: Diagnosis not present

## 2023-10-03 DIAGNOSIS — I4581 Long QT syndrome: Secondary | ICD-10-CM | POA: Diagnosis not present

## 2023-10-03 DIAGNOSIS — M353 Polymyalgia rheumatica: Secondary | ICD-10-CM | POA: Diagnosis not present

## 2023-10-03 DIAGNOSIS — E785 Hyperlipidemia, unspecified: Secondary | ICD-10-CM | POA: Diagnosis not present

## 2023-10-03 DIAGNOSIS — R1314 Dysphagia, pharyngoesophageal phase: Secondary | ICD-10-CM | POA: Diagnosis not present

## 2023-10-03 DIAGNOSIS — R41841 Cognitive communication deficit: Secondary | ICD-10-CM | POA: Diagnosis not present

## 2023-10-07 DIAGNOSIS — R41841 Cognitive communication deficit: Secondary | ICD-10-CM | POA: Diagnosis not present

## 2023-10-07 DIAGNOSIS — N182 Chronic kidney disease, stage 2 (mild): Secondary | ICD-10-CM | POA: Diagnosis not present

## 2023-10-07 DIAGNOSIS — I952 Hypotension due to drugs: Secondary | ICD-10-CM | POA: Diagnosis not present

## 2023-10-07 DIAGNOSIS — I129 Hypertensive chronic kidney disease with stage 1 through stage 4 chronic kidney disease, or unspecified chronic kidney disease: Secondary | ICD-10-CM | POA: Diagnosis not present

## 2023-10-07 DIAGNOSIS — Z96649 Presence of unspecified artificial hip joint: Secondary | ICD-10-CM | POA: Diagnosis not present

## 2023-10-07 DIAGNOSIS — M353 Polymyalgia rheumatica: Secondary | ICD-10-CM | POA: Diagnosis not present

## 2023-10-07 DIAGNOSIS — I4581 Long QT syndrome: Secondary | ICD-10-CM | POA: Diagnosis not present

## 2023-10-07 DIAGNOSIS — Z8673 Personal history of transient ischemic attack (TIA), and cerebral infarction without residual deficits: Secondary | ICD-10-CM | POA: Diagnosis not present

## 2023-10-07 DIAGNOSIS — M1990 Unspecified osteoarthritis, unspecified site: Secondary | ICD-10-CM | POA: Diagnosis not present

## 2023-10-07 DIAGNOSIS — E559 Vitamin D deficiency, unspecified: Secondary | ICD-10-CM | POA: Diagnosis not present

## 2023-10-07 DIAGNOSIS — Z9181 History of falling: Secondary | ICD-10-CM | POA: Diagnosis not present

## 2023-10-07 DIAGNOSIS — W19XXXD Unspecified fall, subsequent encounter: Secondary | ICD-10-CM | POA: Diagnosis not present

## 2023-10-07 DIAGNOSIS — S72141D Displaced intertrochanteric fracture of right femur, subsequent encounter for closed fracture with routine healing: Secondary | ICD-10-CM | POA: Diagnosis not present

## 2023-10-07 DIAGNOSIS — E785 Hyperlipidemia, unspecified: Secondary | ICD-10-CM | POA: Diagnosis not present

## 2023-10-07 DIAGNOSIS — R1314 Dysphagia, pharyngoesophageal phase: Secondary | ICD-10-CM | POA: Diagnosis not present

## 2023-10-09 ENCOUNTER — Telehealth: Payer: Self-pay

## 2023-10-09 NOTE — Telephone Encounter (Signed)
 Spoke with pt reviewed x-ray results, verbalized udnerstanding

## 2023-10-09 NOTE — Telephone Encounter (Signed)
 Copied from CRM [id number]. Topic: General - Other >> Oct 09, 2023 11:05 AM Aletta Edouard wrote: Reason for CRM: patient is returning a call from the office she would like for both numbers if she doesn't answer there where no notes left on the account regarding the call

## 2023-10-10 ENCOUNTER — Other Ambulatory Visit: Payer: Self-pay | Admitting: Family Medicine

## 2023-10-10 DIAGNOSIS — I10 Essential (primary) hypertension: Secondary | ICD-10-CM

## 2023-10-15 DIAGNOSIS — I129 Hypertensive chronic kidney disease with stage 1 through stage 4 chronic kidney disease, or unspecified chronic kidney disease: Secondary | ICD-10-CM | POA: Diagnosis not present

## 2023-10-15 DIAGNOSIS — I952 Hypotension due to drugs: Secondary | ICD-10-CM | POA: Diagnosis not present

## 2023-10-15 DIAGNOSIS — Z8673 Personal history of transient ischemic attack (TIA), and cerebral infarction without residual deficits: Secondary | ICD-10-CM | POA: Diagnosis not present

## 2023-10-15 DIAGNOSIS — S72141D Displaced intertrochanteric fracture of right femur, subsequent encounter for closed fracture with routine healing: Secondary | ICD-10-CM | POA: Diagnosis not present

## 2023-10-15 DIAGNOSIS — E785 Hyperlipidemia, unspecified: Secondary | ICD-10-CM | POA: Diagnosis not present

## 2023-10-15 DIAGNOSIS — E559 Vitamin D deficiency, unspecified: Secondary | ICD-10-CM | POA: Diagnosis not present

## 2023-10-15 DIAGNOSIS — R41841 Cognitive communication deficit: Secondary | ICD-10-CM | POA: Diagnosis not present

## 2023-10-15 DIAGNOSIS — Z9181 History of falling: Secondary | ICD-10-CM | POA: Diagnosis not present

## 2023-10-15 DIAGNOSIS — I4581 Long QT syndrome: Secondary | ICD-10-CM | POA: Diagnosis not present

## 2023-10-15 DIAGNOSIS — N182 Chronic kidney disease, stage 2 (mild): Secondary | ICD-10-CM | POA: Diagnosis not present

## 2023-10-15 DIAGNOSIS — R1314 Dysphagia, pharyngoesophageal phase: Secondary | ICD-10-CM | POA: Diagnosis not present

## 2023-10-15 DIAGNOSIS — M1990 Unspecified osteoarthritis, unspecified site: Secondary | ICD-10-CM | POA: Diagnosis not present

## 2023-10-15 DIAGNOSIS — W19XXXD Unspecified fall, subsequent encounter: Secondary | ICD-10-CM | POA: Diagnosis not present

## 2023-10-15 DIAGNOSIS — M353 Polymyalgia rheumatica: Secondary | ICD-10-CM | POA: Diagnosis not present

## 2023-10-15 DIAGNOSIS — Z96649 Presence of unspecified artificial hip joint: Secondary | ICD-10-CM | POA: Diagnosis not present

## 2023-10-21 DIAGNOSIS — Z9181 History of falling: Secondary | ICD-10-CM | POA: Diagnosis not present

## 2023-10-21 DIAGNOSIS — Z8673 Personal history of transient ischemic attack (TIA), and cerebral infarction without residual deficits: Secondary | ICD-10-CM | POA: Diagnosis not present

## 2023-10-21 DIAGNOSIS — R1314 Dysphagia, pharyngoesophageal phase: Secondary | ICD-10-CM | POA: Diagnosis not present

## 2023-10-21 DIAGNOSIS — S72141D Displaced intertrochanteric fracture of right femur, subsequent encounter for closed fracture with routine healing: Secondary | ICD-10-CM | POA: Diagnosis not present

## 2023-10-21 DIAGNOSIS — R41841 Cognitive communication deficit: Secondary | ICD-10-CM | POA: Diagnosis not present

## 2023-10-21 DIAGNOSIS — Z96649 Presence of unspecified artificial hip joint: Secondary | ICD-10-CM | POA: Diagnosis not present

## 2023-10-21 DIAGNOSIS — M353 Polymyalgia rheumatica: Secondary | ICD-10-CM | POA: Diagnosis not present

## 2023-10-21 DIAGNOSIS — W19XXXD Unspecified fall, subsequent encounter: Secondary | ICD-10-CM | POA: Diagnosis not present

## 2023-10-21 DIAGNOSIS — I952 Hypotension due to drugs: Secondary | ICD-10-CM | POA: Diagnosis not present

## 2023-10-21 DIAGNOSIS — M1990 Unspecified osteoarthritis, unspecified site: Secondary | ICD-10-CM | POA: Diagnosis not present

## 2023-10-21 DIAGNOSIS — N182 Chronic kidney disease, stage 2 (mild): Secondary | ICD-10-CM | POA: Diagnosis not present

## 2023-10-21 DIAGNOSIS — E785 Hyperlipidemia, unspecified: Secondary | ICD-10-CM | POA: Diagnosis not present

## 2023-10-21 DIAGNOSIS — I129 Hypertensive chronic kidney disease with stage 1 through stage 4 chronic kidney disease, or unspecified chronic kidney disease: Secondary | ICD-10-CM | POA: Diagnosis not present

## 2023-10-21 DIAGNOSIS — I4581 Long QT syndrome: Secondary | ICD-10-CM | POA: Diagnosis not present

## 2023-10-21 DIAGNOSIS — E559 Vitamin D deficiency, unspecified: Secondary | ICD-10-CM | POA: Diagnosis not present

## 2023-10-23 ENCOUNTER — Telehealth: Payer: Self-pay | Admitting: Family Medicine

## 2023-10-23 DIAGNOSIS — Z1211 Encounter for screening for malignant neoplasm of colon: Secondary | ICD-10-CM

## 2023-10-23 NOTE — Telephone Encounter (Signed)
I ordered the Cologuard test 

## 2023-10-23 NOTE — Telephone Encounter (Signed)
 Pt is interested in getting a Cologuard.  Her insurance is requesting that she gets either a colonoscopy or Cologuard.  Please contact pt.

## 2023-10-24 NOTE — Telephone Encounter (Signed)
 Left pt a message to contact the office regarding this message

## 2023-10-29 ENCOUNTER — Ambulatory Visit (INDEPENDENT_AMBULATORY_CARE_PROVIDER_SITE_OTHER): Admitting: Family Medicine

## 2023-10-29 ENCOUNTER — Encounter: Payer: Self-pay | Admitting: Family Medicine

## 2023-10-29 ENCOUNTER — Telehealth: Payer: Self-pay | Admitting: Family Medicine

## 2023-10-29 VITALS — BP 128/68 | HR 78 | Temp 97.9°F | Ht 62.0 in | Wt 132.0 lb

## 2023-10-29 DIAGNOSIS — R739 Hyperglycemia, unspecified: Secondary | ICD-10-CM | POA: Diagnosis not present

## 2023-10-29 DIAGNOSIS — M25471 Effusion, right ankle: Secondary | ICD-10-CM | POA: Insufficient documentation

## 2023-10-29 DIAGNOSIS — I6350 Cerebral infarction due to unspecified occlusion or stenosis of unspecified cerebral artery: Secondary | ICD-10-CM

## 2023-10-29 DIAGNOSIS — M25472 Effusion, left ankle: Secondary | ICD-10-CM | POA: Diagnosis not present

## 2023-10-29 DIAGNOSIS — M353 Polymyalgia rheumatica: Secondary | ICD-10-CM | POA: Diagnosis not present

## 2023-10-29 DIAGNOSIS — I10 Essential (primary) hypertension: Secondary | ICD-10-CM

## 2023-10-29 DIAGNOSIS — N182 Chronic kidney disease, stage 2 (mild): Secondary | ICD-10-CM

## 2023-10-29 DIAGNOSIS — E538 Deficiency of other specified B group vitamins: Secondary | ICD-10-CM | POA: Diagnosis not present

## 2023-10-29 LAB — LIPID PANEL
Cholesterol: 176 mg/dL (ref 0–200)
HDL: 75.8 mg/dL (ref >39.00)
LDL Cholesterol: 87 mg/dL (ref 0–99)
NonHDL: 100.11
Total CHOL/HDL Ratio: 2
Triglycerides: 66 mg/dL (ref 0.0–149.0)
VLDL: 13.2 mg/dL (ref 0.0–40.0)

## 2023-10-29 LAB — TSH: TSH: 2.37 u[IU]/mL (ref 0.35–5.50)

## 2023-10-29 LAB — VITAMIN B12: Vitamin B-12: 219 pg/mL (ref 211–911)

## 2023-10-29 LAB — HEMOGLOBIN A1C: Hgb A1c MFr Bld: 5.3 % (ref 4.6–6.5)

## 2023-10-29 NOTE — Progress Notes (Signed)
 Subjective:    Patient ID: [name], female    DOB: 01-21-1946, 78 y.o.   MRN: [id number]  HPI Here to follow up on issues. She feels well in general, although she does mention some ankle swelling that started a few months ago. This is not painful. She has no SOB. She had been taking hydrochlorothiazide and Enalapril for HTN, but after she got home from rehab earlier this year she stopped taing these because her BP was dropping too low. Now this is well controlled off meds. Her  arthritis flares up at times and she takes Tylenol as needed. She had lab work done in January when she was in the hospital including CBC and CMET. These were unremarkable.    Review of Systems  Constitutional: Negative.   HENT: Negative.    Eyes: Negative.   Respiratory: Negative.    Cardiovascular:  Positive for leg swelling. Negative for chest pain and palpitations.  Gastrointestinal: Negative.   Genitourinary:  Negative for decreased urine volume, difficulty urinating, dyspareunia, dysuria, enuresis, flank pain, frequency, hematuria, pelvic pain and urgency.  Musculoskeletal:  Positive for arthralgias.  Skin: Negative.   Neurological: Negative.  Negative for headaches.  Psychiatric/Behavioral: Negative.         Objective:   Physical Exam Constitutional:      General: She is not in acute distress.    Appearance: Normal appearance. She is well-developed.     Comments: Walks with a walker   HENT:     Head: Normocephalic and atraumatic.     Right Ear: External ear normal.     Left Ear: External ear normal.     Nose: Nose normal.     Mouth/Throat:     Pharynx: No oropharyngeal exudate.  Eyes:     General: No scleral icterus.    Conjunctiva/sclera: Conjunctivae normal.     Pupils: Pupils are equal, round, and reactive to light.  Neck:     Thyroid: No thyromegaly.     Vascular: No JVD.  Cardiovascular:     Rate and Rhythm: Normal rate and regular rhythm.     Pulses: Normal pulses.      Heart sounds: Normal heart sounds. No murmur heard.    No friction rub. No gallop.  Pulmonary:     Effort: Pulmonary effort is normal. No respiratory distress.     Breath sounds: Normal breath sounds. No wheezing or rales.  Chest:     Chest wall: No tenderness.  Abdominal:     General: Bowel sounds are normal. There is no distension.     Palpations: Abdomen is soft. There is no mass.     Tenderness: There is no abdominal tenderness. There is no guarding or rebound.  Musculoskeletal:        General: No tenderness. Normal range of motion.     Cervical back: Normal range of motion and neck supple.     Comments: 2+ edema in both ankles   Lymphadenopathy:     Cervical: No cervical adenopathy.  Skin:    General: Skin is warm and dry.     Findings: No erythema or rash.  Neurological:     General: No focal deficit present.     Mental Status: She is alert and oriented to person, place, and time.     Cranial Nerves: No cranial nerve deficit.     Motor: No abnormal muscle tone.     Coordination: Coordination normal.     Deep Tendon Reflexes: Reflexes are  normal and symmetric. Reflexes normal.  Psychiatric:        Mood and Affect: Mood normal.        Behavior: Behavior normal.        Thought Content: Thought content normal.        Judgment: Judgment normal.           Assessment & Plan:  Her HTN is stable off medications. She now has some fluid retention, so she can elevate her legs and she can wear compression stockings during the daytime. Her PMR is stable. Her CKD is stable. We will get labs for lipids, B12, A1c, and TSH today. We spent a total of (34   ) minutes reviewing records and discussing these issues.  Elizabeth Diego, MD

## 2023-10-29 NOTE — Telephone Encounter (Signed)
 Noted.

## 2023-10-29 NOTE — Telephone Encounter (Signed)
 Pt requests that when her lab results come back to call her house and cell phone numbers to reach her.

## 2023-11-01 DIAGNOSIS — R1314 Dysphagia, pharyngoesophageal phase: Secondary | ICD-10-CM | POA: Diagnosis not present

## 2023-11-01 DIAGNOSIS — W19XXXD Unspecified fall, subsequent encounter: Secondary | ICD-10-CM | POA: Diagnosis not present

## 2023-11-01 DIAGNOSIS — M1990 Unspecified osteoarthritis, unspecified site: Secondary | ICD-10-CM | POA: Diagnosis not present

## 2023-11-01 DIAGNOSIS — Z8673 Personal history of transient ischemic attack (TIA), and cerebral infarction without residual deficits: Secondary | ICD-10-CM | POA: Diagnosis not present

## 2023-11-01 DIAGNOSIS — S72141D Displaced intertrochanteric fracture of right femur, subsequent encounter for closed fracture with routine healing: Secondary | ICD-10-CM | POA: Diagnosis not present

## 2023-11-01 DIAGNOSIS — R41841 Cognitive communication deficit: Secondary | ICD-10-CM | POA: Diagnosis not present

## 2023-11-01 DIAGNOSIS — N182 Chronic kidney disease, stage 2 (mild): Secondary | ICD-10-CM | POA: Diagnosis not present

## 2023-11-01 DIAGNOSIS — I952 Hypotension due to drugs: Secondary | ICD-10-CM | POA: Diagnosis not present

## 2023-11-01 DIAGNOSIS — E785 Hyperlipidemia, unspecified: Secondary | ICD-10-CM | POA: Diagnosis not present

## 2023-11-01 DIAGNOSIS — Z96649 Presence of unspecified artificial hip joint: Secondary | ICD-10-CM | POA: Diagnosis not present

## 2023-11-01 DIAGNOSIS — E559 Vitamin D deficiency, unspecified: Secondary | ICD-10-CM | POA: Diagnosis not present

## 2023-11-01 DIAGNOSIS — Z9181 History of falling: Secondary | ICD-10-CM | POA: Diagnosis not present

## 2023-11-01 DIAGNOSIS — I129 Hypertensive chronic kidney disease with stage 1 through stage 4 chronic kidney disease, or unspecified chronic kidney disease: Secondary | ICD-10-CM | POA: Diagnosis not present

## 2023-11-01 DIAGNOSIS — I4581 Long QT syndrome: Secondary | ICD-10-CM | POA: Diagnosis not present

## 2023-11-01 DIAGNOSIS — M353 Polymyalgia rheumatica: Secondary | ICD-10-CM | POA: Diagnosis not present

## 2023-11-04 DIAGNOSIS — Z8673 Personal history of transient ischemic attack (TIA), and cerebral infarction without residual deficits: Secondary | ICD-10-CM | POA: Diagnosis not present

## 2023-11-04 DIAGNOSIS — M1990 Unspecified osteoarthritis, unspecified site: Secondary | ICD-10-CM | POA: Diagnosis not present

## 2023-11-04 DIAGNOSIS — R41841 Cognitive communication deficit: Secondary | ICD-10-CM | POA: Diagnosis not present

## 2023-11-04 DIAGNOSIS — E559 Vitamin D deficiency, unspecified: Secondary | ICD-10-CM | POA: Diagnosis not present

## 2023-11-04 DIAGNOSIS — W19XXXD Unspecified fall, subsequent encounter: Secondary | ICD-10-CM | POA: Diagnosis not present

## 2023-11-04 DIAGNOSIS — I952 Hypotension due to drugs: Secondary | ICD-10-CM | POA: Diagnosis not present

## 2023-11-04 DIAGNOSIS — S72141D Displaced intertrochanteric fracture of right femur, subsequent encounter for closed fracture with routine healing: Secondary | ICD-10-CM | POA: Diagnosis not present

## 2023-11-04 DIAGNOSIS — R1314 Dysphagia, pharyngoesophageal phase: Secondary | ICD-10-CM | POA: Diagnosis not present

## 2023-11-04 DIAGNOSIS — I129 Hypertensive chronic kidney disease with stage 1 through stage 4 chronic kidney disease, or unspecified chronic kidney disease: Secondary | ICD-10-CM | POA: Diagnosis not present

## 2023-11-04 DIAGNOSIS — Z96649 Presence of unspecified artificial hip joint: Secondary | ICD-10-CM | POA: Diagnosis not present

## 2023-11-04 DIAGNOSIS — M353 Polymyalgia rheumatica: Secondary | ICD-10-CM | POA: Diagnosis not present

## 2023-11-04 DIAGNOSIS — N182 Chronic kidney disease, stage 2 (mild): Secondary | ICD-10-CM | POA: Diagnosis not present

## 2023-11-04 DIAGNOSIS — E785 Hyperlipidemia, unspecified: Secondary | ICD-10-CM | POA: Diagnosis not present

## 2023-11-04 DIAGNOSIS — Z9181 History of falling: Secondary | ICD-10-CM | POA: Diagnosis not present

## 2023-11-04 DIAGNOSIS — I4581 Long QT syndrome: Secondary | ICD-10-CM | POA: Diagnosis not present

## 2023-11-15 DIAGNOSIS — S72141D Displaced intertrochanteric fracture of right femur, subsequent encounter for closed fracture with routine healing: Secondary | ICD-10-CM | POA: Diagnosis not present

## 2023-11-15 DIAGNOSIS — Z8673 Personal history of transient ischemic attack (TIA), and cerebral infarction without residual deficits: Secondary | ICD-10-CM | POA: Diagnosis not present

## 2023-11-15 DIAGNOSIS — N182 Chronic kidney disease, stage 2 (mild): Secondary | ICD-10-CM | POA: Diagnosis not present

## 2023-11-15 DIAGNOSIS — Z96649 Presence of unspecified artificial hip joint: Secondary | ICD-10-CM | POA: Diagnosis not present

## 2023-11-15 DIAGNOSIS — W19XXXD Unspecified fall, subsequent encounter: Secondary | ICD-10-CM | POA: Diagnosis not present

## 2023-11-15 DIAGNOSIS — Z9181 History of falling: Secondary | ICD-10-CM | POA: Diagnosis not present

## 2023-11-15 DIAGNOSIS — E559 Vitamin D deficiency, unspecified: Secondary | ICD-10-CM | POA: Diagnosis not present

## 2023-11-15 DIAGNOSIS — E785 Hyperlipidemia, unspecified: Secondary | ICD-10-CM | POA: Diagnosis not present

## 2023-11-15 DIAGNOSIS — I4581 Long QT syndrome: Secondary | ICD-10-CM | POA: Diagnosis not present

## 2023-11-15 DIAGNOSIS — M1990 Unspecified osteoarthritis, unspecified site: Secondary | ICD-10-CM | POA: Diagnosis not present

## 2023-11-15 DIAGNOSIS — I129 Hypertensive chronic kidney disease with stage 1 through stage 4 chronic kidney disease, or unspecified chronic kidney disease: Secondary | ICD-10-CM | POA: Diagnosis not present

## 2023-11-15 DIAGNOSIS — I952 Hypotension due to drugs: Secondary | ICD-10-CM | POA: Diagnosis not present

## 2023-11-15 DIAGNOSIS — M353 Polymyalgia rheumatica: Secondary | ICD-10-CM | POA: Diagnosis not present

## 2023-11-15 DIAGNOSIS — R41841 Cognitive communication deficit: Secondary | ICD-10-CM | POA: Diagnosis not present

## 2023-11-15 DIAGNOSIS — R1314 Dysphagia, pharyngoesophageal phase: Secondary | ICD-10-CM | POA: Diagnosis not present

## 2023-11-20 DIAGNOSIS — R41841 Cognitive communication deficit: Secondary | ICD-10-CM | POA: Diagnosis not present

## 2023-11-20 DIAGNOSIS — M353 Polymyalgia rheumatica: Secondary | ICD-10-CM | POA: Diagnosis not present

## 2023-11-20 DIAGNOSIS — I4581 Long QT syndrome: Secondary | ICD-10-CM | POA: Diagnosis not present

## 2023-11-20 DIAGNOSIS — E785 Hyperlipidemia, unspecified: Secondary | ICD-10-CM | POA: Diagnosis not present

## 2023-11-20 DIAGNOSIS — W19XXXD Unspecified fall, subsequent encounter: Secondary | ICD-10-CM | POA: Diagnosis not present

## 2023-11-20 DIAGNOSIS — Z9181 History of falling: Secondary | ICD-10-CM | POA: Diagnosis not present

## 2023-11-20 DIAGNOSIS — N182 Chronic kidney disease, stage 2 (mild): Secondary | ICD-10-CM | POA: Diagnosis not present

## 2023-11-20 DIAGNOSIS — R339 Retention of urine, unspecified: Secondary | ICD-10-CM | POA: Diagnosis not present

## 2023-11-20 DIAGNOSIS — E876 Hypokalemia: Secondary | ICD-10-CM | POA: Diagnosis not present

## 2023-11-20 DIAGNOSIS — I129 Hypertensive chronic kidney disease with stage 1 through stage 4 chronic kidney disease, or unspecified chronic kidney disease: Secondary | ICD-10-CM | POA: Diagnosis not present

## 2023-11-20 DIAGNOSIS — S72141D Displaced intertrochanteric fracture of right femur, subsequent encounter for closed fracture with routine healing: Secondary | ICD-10-CM | POA: Diagnosis not present

## 2023-11-20 DIAGNOSIS — R1314 Dysphagia, pharyngoesophageal phase: Secondary | ICD-10-CM | POA: Diagnosis not present

## 2023-11-20 DIAGNOSIS — D649 Anemia, unspecified: Secondary | ICD-10-CM | POA: Diagnosis not present

## 2023-11-20 DIAGNOSIS — I952 Hypotension due to drugs: Secondary | ICD-10-CM | POA: Diagnosis not present

## 2023-11-20 DIAGNOSIS — E559 Vitamin D deficiency, unspecified: Secondary | ICD-10-CM | POA: Diagnosis not present

## 2023-11-20 DIAGNOSIS — Z8673 Personal history of transient ischemic attack (TIA), and cerebral infarction without residual deficits: Secondary | ICD-10-CM | POA: Diagnosis not present

## 2023-11-20 DIAGNOSIS — D696 Thrombocytopenia, unspecified: Secondary | ICD-10-CM | POA: Diagnosis not present

## 2023-11-20 DIAGNOSIS — Z96649 Presence of unspecified artificial hip joint: Secondary | ICD-10-CM | POA: Diagnosis not present

## 2023-11-20 DIAGNOSIS — M1990 Unspecified osteoarthritis, unspecified site: Secondary | ICD-10-CM | POA: Diagnosis not present

## 2023-11-22 NOTE — Progress Notes (Signed)
   11/22/2023  Patient ID: [name], female   DOB: 03-09-1946, 78 y.o.   MRN: [id number]  Pharmacy Quality Measure Review  This patient is appearing on a report for being at risk of failing the adherence measure for hypertension (ACEi/ARB) medications this calendar year.   Medication: Enalapril  Last fill date: 08/16/23 for 90 day supply  Medication was stopped earlier this year due to low BP per chart review. No further action needed at this time.   Carnell Christian, PharmD Clinical Pharmacist [phone]

## 2023-11-27 DIAGNOSIS — E876 Hypokalemia: Secondary | ICD-10-CM | POA: Diagnosis not present

## 2023-11-27 DIAGNOSIS — R41841 Cognitive communication deficit: Secondary | ICD-10-CM | POA: Diagnosis not present

## 2023-11-27 DIAGNOSIS — W19XXXD Unspecified fall, subsequent encounter: Secondary | ICD-10-CM | POA: Diagnosis not present

## 2023-11-27 DIAGNOSIS — E559 Vitamin D deficiency, unspecified: Secondary | ICD-10-CM | POA: Diagnosis not present

## 2023-11-27 DIAGNOSIS — Z96649 Presence of unspecified artificial hip joint: Secondary | ICD-10-CM | POA: Diagnosis not present

## 2023-11-27 DIAGNOSIS — E785 Hyperlipidemia, unspecified: Secondary | ICD-10-CM | POA: Diagnosis not present

## 2023-11-27 DIAGNOSIS — Z8673 Personal history of transient ischemic attack (TIA), and cerebral infarction without residual deficits: Secondary | ICD-10-CM | POA: Diagnosis not present

## 2023-11-27 DIAGNOSIS — N182 Chronic kidney disease, stage 2 (mild): Secondary | ICD-10-CM | POA: Diagnosis not present

## 2023-11-27 DIAGNOSIS — I129 Hypertensive chronic kidney disease with stage 1 through stage 4 chronic kidney disease, or unspecified chronic kidney disease: Secondary | ICD-10-CM | POA: Diagnosis not present

## 2023-11-27 DIAGNOSIS — I4581 Long QT syndrome: Secondary | ICD-10-CM | POA: Diagnosis not present

## 2023-11-27 DIAGNOSIS — D696 Thrombocytopenia, unspecified: Secondary | ICD-10-CM | POA: Diagnosis not present

## 2023-11-27 DIAGNOSIS — D649 Anemia, unspecified: Secondary | ICD-10-CM | POA: Diagnosis not present

## 2023-11-27 DIAGNOSIS — M1990 Unspecified osteoarthritis, unspecified site: Secondary | ICD-10-CM | POA: Diagnosis not present

## 2023-11-27 DIAGNOSIS — I952 Hypotension due to drugs: Secondary | ICD-10-CM | POA: Diagnosis not present

## 2023-11-27 DIAGNOSIS — S72141D Displaced intertrochanteric fracture of right femur, subsequent encounter for closed fracture with routine healing: Secondary | ICD-10-CM | POA: Diagnosis not present

## 2023-11-27 DIAGNOSIS — R1314 Dysphagia, pharyngoesophageal phase: Secondary | ICD-10-CM | POA: Diagnosis not present

## 2023-11-27 DIAGNOSIS — Z9181 History of falling: Secondary | ICD-10-CM | POA: Diagnosis not present

## 2023-11-27 DIAGNOSIS — M353 Polymyalgia rheumatica: Secondary | ICD-10-CM | POA: Diagnosis not present

## 2023-11-27 DIAGNOSIS — R339 Retention of urine, unspecified: Secondary | ICD-10-CM | POA: Diagnosis not present

## 2023-12-02 DIAGNOSIS — I129 Hypertensive chronic kidney disease with stage 1 through stage 4 chronic kidney disease, or unspecified chronic kidney disease: Secondary | ICD-10-CM | POA: Diagnosis not present

## 2023-12-02 DIAGNOSIS — I952 Hypotension due to drugs: Secondary | ICD-10-CM | POA: Diagnosis not present

## 2023-12-02 DIAGNOSIS — W19XXXD Unspecified fall, subsequent encounter: Secondary | ICD-10-CM | POA: Diagnosis not present

## 2023-12-02 DIAGNOSIS — E876 Hypokalemia: Secondary | ICD-10-CM | POA: Diagnosis not present

## 2023-12-02 DIAGNOSIS — R1314 Dysphagia, pharyngoesophageal phase: Secondary | ICD-10-CM | POA: Diagnosis not present

## 2023-12-02 DIAGNOSIS — D649 Anemia, unspecified: Secondary | ICD-10-CM | POA: Diagnosis not present

## 2023-12-02 DIAGNOSIS — E559 Vitamin D deficiency, unspecified: Secondary | ICD-10-CM | POA: Diagnosis not present

## 2023-12-02 DIAGNOSIS — M1990 Unspecified osteoarthritis, unspecified site: Secondary | ICD-10-CM | POA: Diagnosis not present

## 2023-12-02 DIAGNOSIS — R41841 Cognitive communication deficit: Secondary | ICD-10-CM | POA: Diagnosis not present

## 2023-12-02 DIAGNOSIS — D696 Thrombocytopenia, unspecified: Secondary | ICD-10-CM | POA: Diagnosis not present

## 2023-12-02 DIAGNOSIS — I4581 Long QT syndrome: Secondary | ICD-10-CM | POA: Diagnosis not present

## 2023-12-02 DIAGNOSIS — R339 Retention of urine, unspecified: Secondary | ICD-10-CM | POA: Diagnosis not present

## 2023-12-02 DIAGNOSIS — Z8673 Personal history of transient ischemic attack (TIA), and cerebral infarction without residual deficits: Secondary | ICD-10-CM | POA: Diagnosis not present

## 2023-12-02 DIAGNOSIS — Z9181 History of falling: Secondary | ICD-10-CM | POA: Diagnosis not present

## 2023-12-02 DIAGNOSIS — S72141D Displaced intertrochanteric fracture of right femur, subsequent encounter for closed fracture with routine healing: Secondary | ICD-10-CM | POA: Diagnosis not present

## 2023-12-02 DIAGNOSIS — Z96649 Presence of unspecified artificial hip joint: Secondary | ICD-10-CM | POA: Diagnosis not present

## 2023-12-02 DIAGNOSIS — E785 Hyperlipidemia, unspecified: Secondary | ICD-10-CM | POA: Diagnosis not present

## 2023-12-02 DIAGNOSIS — N182 Chronic kidney disease, stage 2 (mild): Secondary | ICD-10-CM | POA: Diagnosis not present

## 2023-12-02 DIAGNOSIS — M353 Polymyalgia rheumatica: Secondary | ICD-10-CM | POA: Diagnosis not present

## 2023-12-12 DIAGNOSIS — Z96649 Presence of unspecified artificial hip joint: Secondary | ICD-10-CM | POA: Diagnosis not present

## 2023-12-12 DIAGNOSIS — E559 Vitamin D deficiency, unspecified: Secondary | ICD-10-CM | POA: Diagnosis not present

## 2023-12-12 DIAGNOSIS — M353 Polymyalgia rheumatica: Secondary | ICD-10-CM | POA: Diagnosis not present

## 2023-12-12 DIAGNOSIS — S72141D Displaced intertrochanteric fracture of right femur, subsequent encounter for closed fracture with routine healing: Secondary | ICD-10-CM | POA: Diagnosis not present

## 2023-12-12 DIAGNOSIS — Z9181 History of falling: Secondary | ICD-10-CM | POA: Diagnosis not present

## 2023-12-12 DIAGNOSIS — Z8673 Personal history of transient ischemic attack (TIA), and cerebral infarction without residual deficits: Secondary | ICD-10-CM | POA: Diagnosis not present

## 2023-12-12 DIAGNOSIS — E876 Hypokalemia: Secondary | ICD-10-CM | POA: Diagnosis not present

## 2023-12-12 DIAGNOSIS — N182 Chronic kidney disease, stage 2 (mild): Secondary | ICD-10-CM | POA: Diagnosis not present

## 2023-12-12 DIAGNOSIS — D649 Anemia, unspecified: Secondary | ICD-10-CM | POA: Diagnosis not present

## 2023-12-12 DIAGNOSIS — E785 Hyperlipidemia, unspecified: Secondary | ICD-10-CM | POA: Diagnosis not present

## 2023-12-12 DIAGNOSIS — W19XXXD Unspecified fall, subsequent encounter: Secondary | ICD-10-CM | POA: Diagnosis not present

## 2023-12-12 DIAGNOSIS — M1990 Unspecified osteoarthritis, unspecified site: Secondary | ICD-10-CM | POA: Diagnosis not present

## 2023-12-12 DIAGNOSIS — I4581 Long QT syndrome: Secondary | ICD-10-CM | POA: Diagnosis not present

## 2023-12-12 DIAGNOSIS — D696 Thrombocytopenia, unspecified: Secondary | ICD-10-CM | POA: Diagnosis not present

## 2023-12-12 DIAGNOSIS — R339 Retention of urine, unspecified: Secondary | ICD-10-CM | POA: Diagnosis not present

## 2023-12-12 DIAGNOSIS — R1314 Dysphagia, pharyngoesophageal phase: Secondary | ICD-10-CM | POA: Diagnosis not present

## 2023-12-12 DIAGNOSIS — R41841 Cognitive communication deficit: Secondary | ICD-10-CM | POA: Diagnosis not present

## 2023-12-12 DIAGNOSIS — I952 Hypotension due to drugs: Secondary | ICD-10-CM | POA: Diagnosis not present

## 2023-12-12 DIAGNOSIS — I129 Hypertensive chronic kidney disease with stage 1 through stage 4 chronic kidney disease, or unspecified chronic kidney disease: Secondary | ICD-10-CM | POA: Diagnosis not present

## 2023-12-17 DIAGNOSIS — R1314 Dysphagia, pharyngoesophageal phase: Secondary | ICD-10-CM | POA: Diagnosis not present

## 2023-12-17 DIAGNOSIS — R41841 Cognitive communication deficit: Secondary | ICD-10-CM | POA: Diagnosis not present

## 2023-12-17 DIAGNOSIS — E559 Vitamin D deficiency, unspecified: Secondary | ICD-10-CM | POA: Diagnosis not present

## 2023-12-17 DIAGNOSIS — D696 Thrombocytopenia, unspecified: Secondary | ICD-10-CM | POA: Diagnosis not present

## 2023-12-17 DIAGNOSIS — M353 Polymyalgia rheumatica: Secondary | ICD-10-CM | POA: Diagnosis not present

## 2023-12-17 DIAGNOSIS — S72141D Displaced intertrochanteric fracture of right femur, subsequent encounter for closed fracture with routine healing: Secondary | ICD-10-CM | POA: Diagnosis not present

## 2023-12-17 DIAGNOSIS — I4581 Long QT syndrome: Secondary | ICD-10-CM | POA: Diagnosis not present

## 2023-12-17 DIAGNOSIS — D649 Anemia, unspecified: Secondary | ICD-10-CM | POA: Diagnosis not present

## 2023-12-17 DIAGNOSIS — W19XXXD Unspecified fall, subsequent encounter: Secondary | ICD-10-CM | POA: Diagnosis not present

## 2023-12-17 DIAGNOSIS — Z96649 Presence of unspecified artificial hip joint: Secondary | ICD-10-CM | POA: Diagnosis not present

## 2023-12-17 DIAGNOSIS — N182 Chronic kidney disease, stage 2 (mild): Secondary | ICD-10-CM | POA: Diagnosis not present

## 2023-12-17 DIAGNOSIS — Z9181 History of falling: Secondary | ICD-10-CM | POA: Diagnosis not present

## 2023-12-17 DIAGNOSIS — I129 Hypertensive chronic kidney disease with stage 1 through stage 4 chronic kidney disease, or unspecified chronic kidney disease: Secondary | ICD-10-CM | POA: Diagnosis not present

## 2023-12-17 DIAGNOSIS — I952 Hypotension due to drugs: Secondary | ICD-10-CM | POA: Diagnosis not present

## 2023-12-17 DIAGNOSIS — R339 Retention of urine, unspecified: Secondary | ICD-10-CM | POA: Diagnosis not present

## 2023-12-17 DIAGNOSIS — M1990 Unspecified osteoarthritis, unspecified site: Secondary | ICD-10-CM | POA: Diagnosis not present

## 2023-12-17 DIAGNOSIS — E876 Hypokalemia: Secondary | ICD-10-CM | POA: Diagnosis not present

## 2023-12-17 DIAGNOSIS — E785 Hyperlipidemia, unspecified: Secondary | ICD-10-CM | POA: Diagnosis not present

## 2023-12-17 DIAGNOSIS — Z8673 Personal history of transient ischemic attack (TIA), and cerebral infarction without residual deficits: Secondary | ICD-10-CM | POA: Diagnosis not present

## 2023-12-23 DIAGNOSIS — E559 Vitamin D deficiency, unspecified: Secondary | ICD-10-CM | POA: Diagnosis not present

## 2023-12-23 DIAGNOSIS — R1314 Dysphagia, pharyngoesophageal phase: Secondary | ICD-10-CM | POA: Diagnosis not present

## 2023-12-23 DIAGNOSIS — I4581 Long QT syndrome: Secondary | ICD-10-CM | POA: Diagnosis not present

## 2023-12-23 DIAGNOSIS — S72141D Displaced intertrochanteric fracture of right femur, subsequent encounter for closed fracture with routine healing: Secondary | ICD-10-CM | POA: Diagnosis not present

## 2023-12-23 DIAGNOSIS — R41841 Cognitive communication deficit: Secondary | ICD-10-CM | POA: Diagnosis not present

## 2023-12-23 DIAGNOSIS — E785 Hyperlipidemia, unspecified: Secondary | ICD-10-CM | POA: Diagnosis not present

## 2023-12-23 DIAGNOSIS — N182 Chronic kidney disease, stage 2 (mild): Secondary | ICD-10-CM | POA: Diagnosis not present

## 2023-12-23 DIAGNOSIS — D696 Thrombocytopenia, unspecified: Secondary | ICD-10-CM | POA: Diagnosis not present

## 2023-12-23 DIAGNOSIS — Z9181 History of falling: Secondary | ICD-10-CM | POA: Diagnosis not present

## 2023-12-23 DIAGNOSIS — E876 Hypokalemia: Secondary | ICD-10-CM | POA: Diagnosis not present

## 2023-12-23 DIAGNOSIS — I129 Hypertensive chronic kidney disease with stage 1 through stage 4 chronic kidney disease, or unspecified chronic kidney disease: Secondary | ICD-10-CM | POA: Diagnosis not present

## 2023-12-23 DIAGNOSIS — R339 Retention of urine, unspecified: Secondary | ICD-10-CM | POA: Diagnosis not present

## 2023-12-23 DIAGNOSIS — M1990 Unspecified osteoarthritis, unspecified site: Secondary | ICD-10-CM | POA: Diagnosis not present

## 2023-12-23 DIAGNOSIS — I952 Hypotension due to drugs: Secondary | ICD-10-CM | POA: Diagnosis not present

## 2023-12-23 DIAGNOSIS — D649 Anemia, unspecified: Secondary | ICD-10-CM | POA: Diagnosis not present

## 2023-12-23 DIAGNOSIS — W19XXXD Unspecified fall, subsequent encounter: Secondary | ICD-10-CM | POA: Diagnosis not present

## 2023-12-23 DIAGNOSIS — Z96649 Presence of unspecified artificial hip joint: Secondary | ICD-10-CM | POA: Diagnosis not present

## 2023-12-23 DIAGNOSIS — M353 Polymyalgia rheumatica: Secondary | ICD-10-CM | POA: Diagnosis not present

## 2023-12-23 DIAGNOSIS — Z8673 Personal history of transient ischemic attack (TIA), and cerebral infarction without residual deficits: Secondary | ICD-10-CM | POA: Diagnosis not present

## 2023-12-31 DIAGNOSIS — R339 Retention of urine, unspecified: Secondary | ICD-10-CM | POA: Diagnosis not present

## 2023-12-31 DIAGNOSIS — R41841 Cognitive communication deficit: Secondary | ICD-10-CM | POA: Diagnosis not present

## 2023-12-31 DIAGNOSIS — E785 Hyperlipidemia, unspecified: Secondary | ICD-10-CM | POA: Diagnosis not present

## 2023-12-31 DIAGNOSIS — I952 Hypotension due to drugs: Secondary | ICD-10-CM | POA: Diagnosis not present

## 2023-12-31 DIAGNOSIS — Z96649 Presence of unspecified artificial hip joint: Secondary | ICD-10-CM | POA: Diagnosis not present

## 2023-12-31 DIAGNOSIS — D649 Anemia, unspecified: Secondary | ICD-10-CM | POA: Diagnosis not present

## 2023-12-31 DIAGNOSIS — D696 Thrombocytopenia, unspecified: Secondary | ICD-10-CM | POA: Diagnosis not present

## 2023-12-31 DIAGNOSIS — Z9181 History of falling: Secondary | ICD-10-CM | POA: Diagnosis not present

## 2023-12-31 DIAGNOSIS — Z8673 Personal history of transient ischemic attack (TIA), and cerebral infarction without residual deficits: Secondary | ICD-10-CM | POA: Diagnosis not present

## 2023-12-31 DIAGNOSIS — N182 Chronic kidney disease, stage 2 (mild): Secondary | ICD-10-CM | POA: Diagnosis not present

## 2023-12-31 DIAGNOSIS — R1314 Dysphagia, pharyngoesophageal phase: Secondary | ICD-10-CM | POA: Diagnosis not present

## 2023-12-31 DIAGNOSIS — M1990 Unspecified osteoarthritis, unspecified site: Secondary | ICD-10-CM | POA: Diagnosis not present

## 2023-12-31 DIAGNOSIS — I4581 Long QT syndrome: Secondary | ICD-10-CM | POA: Diagnosis not present

## 2023-12-31 DIAGNOSIS — M353 Polymyalgia rheumatica: Secondary | ICD-10-CM | POA: Diagnosis not present

## 2023-12-31 DIAGNOSIS — E876 Hypokalemia: Secondary | ICD-10-CM | POA: Diagnosis not present

## 2023-12-31 DIAGNOSIS — S72141D Displaced intertrochanteric fracture of right femur, subsequent encounter for closed fracture with routine healing: Secondary | ICD-10-CM | POA: Diagnosis not present

## 2023-12-31 DIAGNOSIS — I129 Hypertensive chronic kidney disease with stage 1 through stage 4 chronic kidney disease, or unspecified chronic kidney disease: Secondary | ICD-10-CM | POA: Diagnosis not present

## 2023-12-31 DIAGNOSIS — E559 Vitamin D deficiency, unspecified: Secondary | ICD-10-CM | POA: Diagnosis not present

## 2023-12-31 DIAGNOSIS — W19XXXD Unspecified fall, subsequent encounter: Secondary | ICD-10-CM | POA: Diagnosis not present

## 2024-01-08 DIAGNOSIS — R339 Retention of urine, unspecified: Secondary | ICD-10-CM | POA: Diagnosis not present

## 2024-01-08 DIAGNOSIS — D649 Anemia, unspecified: Secondary | ICD-10-CM | POA: Diagnosis not present

## 2024-01-08 DIAGNOSIS — Z9181 History of falling: Secondary | ICD-10-CM | POA: Diagnosis not present

## 2024-01-08 DIAGNOSIS — M1990 Unspecified osteoarthritis, unspecified site: Secondary | ICD-10-CM | POA: Diagnosis not present

## 2024-01-08 DIAGNOSIS — Z96649 Presence of unspecified artificial hip joint: Secondary | ICD-10-CM | POA: Diagnosis not present

## 2024-01-08 DIAGNOSIS — D696 Thrombocytopenia, unspecified: Secondary | ICD-10-CM | POA: Diagnosis not present

## 2024-01-08 DIAGNOSIS — E785 Hyperlipidemia, unspecified: Secondary | ICD-10-CM | POA: Diagnosis not present

## 2024-01-08 DIAGNOSIS — W19XXXD Unspecified fall, subsequent encounter: Secondary | ICD-10-CM | POA: Diagnosis not present

## 2024-01-08 DIAGNOSIS — N182 Chronic kidney disease, stage 2 (mild): Secondary | ICD-10-CM | POA: Diagnosis not present

## 2024-01-08 DIAGNOSIS — E876 Hypokalemia: Secondary | ICD-10-CM | POA: Diagnosis not present

## 2024-01-08 DIAGNOSIS — R41841 Cognitive communication deficit: Secondary | ICD-10-CM | POA: Diagnosis not present

## 2024-01-08 DIAGNOSIS — I129 Hypertensive chronic kidney disease with stage 1 through stage 4 chronic kidney disease, or unspecified chronic kidney disease: Secondary | ICD-10-CM | POA: Diagnosis not present

## 2024-01-08 DIAGNOSIS — I4581 Long QT syndrome: Secondary | ICD-10-CM | POA: Diagnosis not present

## 2024-01-08 DIAGNOSIS — M353 Polymyalgia rheumatica: Secondary | ICD-10-CM | POA: Diagnosis not present

## 2024-01-08 DIAGNOSIS — R1314 Dysphagia, pharyngoesophageal phase: Secondary | ICD-10-CM | POA: Diagnosis not present

## 2024-01-08 DIAGNOSIS — Z8673 Personal history of transient ischemic attack (TIA), and cerebral infarction without residual deficits: Secondary | ICD-10-CM | POA: Diagnosis not present

## 2024-01-08 DIAGNOSIS — E559 Vitamin D deficiency, unspecified: Secondary | ICD-10-CM | POA: Diagnosis not present

## 2024-01-08 DIAGNOSIS — I952 Hypotension due to drugs: Secondary | ICD-10-CM | POA: Diagnosis not present

## 2024-01-08 DIAGNOSIS — S72141D Displaced intertrochanteric fracture of right femur, subsequent encounter for closed fracture with routine healing: Secondary | ICD-10-CM | POA: Diagnosis not present

## 2024-01-14 DIAGNOSIS — D649 Anemia, unspecified: Secondary | ICD-10-CM | POA: Diagnosis not present

## 2024-01-14 DIAGNOSIS — M353 Polymyalgia rheumatica: Secondary | ICD-10-CM | POA: Diagnosis not present

## 2024-01-14 DIAGNOSIS — I4581 Long QT syndrome: Secondary | ICD-10-CM | POA: Diagnosis not present

## 2024-01-14 DIAGNOSIS — I129 Hypertensive chronic kidney disease with stage 1 through stage 4 chronic kidney disease, or unspecified chronic kidney disease: Secondary | ICD-10-CM | POA: Diagnosis not present

## 2024-01-14 DIAGNOSIS — R41841 Cognitive communication deficit: Secondary | ICD-10-CM | POA: Diagnosis not present

## 2024-01-14 DIAGNOSIS — E559 Vitamin D deficiency, unspecified: Secondary | ICD-10-CM | POA: Diagnosis not present

## 2024-01-14 DIAGNOSIS — E876 Hypokalemia: Secondary | ICD-10-CM | POA: Diagnosis not present

## 2024-01-14 DIAGNOSIS — Z8673 Personal history of transient ischemic attack (TIA), and cerebral infarction without residual deficits: Secondary | ICD-10-CM | POA: Diagnosis not present

## 2024-01-14 DIAGNOSIS — M1990 Unspecified osteoarthritis, unspecified site: Secondary | ICD-10-CM | POA: Diagnosis not present

## 2024-01-14 DIAGNOSIS — W19XXXD Unspecified fall, subsequent encounter: Secondary | ICD-10-CM | POA: Diagnosis not present

## 2024-01-14 DIAGNOSIS — N182 Chronic kidney disease, stage 2 (mild): Secondary | ICD-10-CM | POA: Diagnosis not present

## 2024-01-14 DIAGNOSIS — E785 Hyperlipidemia, unspecified: Secondary | ICD-10-CM | POA: Diagnosis not present

## 2024-01-14 DIAGNOSIS — S72141D Displaced intertrochanteric fracture of right femur, subsequent encounter for closed fracture with routine healing: Secondary | ICD-10-CM | POA: Diagnosis not present

## 2024-01-14 DIAGNOSIS — I952 Hypotension due to drugs: Secondary | ICD-10-CM | POA: Diagnosis not present

## 2024-01-14 DIAGNOSIS — R339 Retention of urine, unspecified: Secondary | ICD-10-CM | POA: Diagnosis not present

## 2024-01-14 DIAGNOSIS — Z96649 Presence of unspecified artificial hip joint: Secondary | ICD-10-CM | POA: Diagnosis not present

## 2024-01-14 DIAGNOSIS — R1314 Dysphagia, pharyngoesophageal phase: Secondary | ICD-10-CM | POA: Diagnosis not present

## 2024-01-14 DIAGNOSIS — Z9181 History of falling: Secondary | ICD-10-CM | POA: Diagnosis not present

## 2024-01-14 DIAGNOSIS — D696 Thrombocytopenia, unspecified: Secondary | ICD-10-CM | POA: Diagnosis not present

## 2024-01-16 DIAGNOSIS — R339 Retention of urine, unspecified: Secondary | ICD-10-CM | POA: Diagnosis not present

## 2024-01-16 DIAGNOSIS — D649 Anemia, unspecified: Secondary | ICD-10-CM | POA: Diagnosis not present

## 2024-01-16 DIAGNOSIS — Z96649 Presence of unspecified artificial hip joint: Secondary | ICD-10-CM | POA: Diagnosis not present

## 2024-01-16 DIAGNOSIS — I129 Hypertensive chronic kidney disease with stage 1 through stage 4 chronic kidney disease, or unspecified chronic kidney disease: Secondary | ICD-10-CM | POA: Diagnosis not present

## 2024-01-16 DIAGNOSIS — S72141D Displaced intertrochanteric fracture of right femur, subsequent encounter for closed fracture with routine healing: Secondary | ICD-10-CM | POA: Diagnosis not present

## 2024-01-16 DIAGNOSIS — M1990 Unspecified osteoarthritis, unspecified site: Secondary | ICD-10-CM | POA: Diagnosis not present

## 2024-01-16 DIAGNOSIS — D696 Thrombocytopenia, unspecified: Secondary | ICD-10-CM | POA: Diagnosis not present

## 2024-01-16 DIAGNOSIS — R41841 Cognitive communication deficit: Secondary | ICD-10-CM | POA: Diagnosis not present

## 2024-01-16 DIAGNOSIS — Z9181 History of falling: Secondary | ICD-10-CM | POA: Diagnosis not present

## 2024-01-16 DIAGNOSIS — I4581 Long QT syndrome: Secondary | ICD-10-CM | POA: Diagnosis not present

## 2024-01-16 DIAGNOSIS — M353 Polymyalgia rheumatica: Secondary | ICD-10-CM | POA: Diagnosis not present

## 2024-01-16 DIAGNOSIS — W19XXXD Unspecified fall, subsequent encounter: Secondary | ICD-10-CM | POA: Diagnosis not present

## 2024-01-16 DIAGNOSIS — I952 Hypotension due to drugs: Secondary | ICD-10-CM | POA: Diagnosis not present

## 2024-01-16 DIAGNOSIS — E785 Hyperlipidemia, unspecified: Secondary | ICD-10-CM | POA: Diagnosis not present

## 2024-01-16 DIAGNOSIS — Z8673 Personal history of transient ischemic attack (TIA), and cerebral infarction without residual deficits: Secondary | ICD-10-CM | POA: Diagnosis not present

## 2024-01-16 DIAGNOSIS — N182 Chronic kidney disease, stage 2 (mild): Secondary | ICD-10-CM | POA: Diagnosis not present

## 2024-01-16 DIAGNOSIS — E876 Hypokalemia: Secondary | ICD-10-CM | POA: Diagnosis not present

## 2024-01-16 DIAGNOSIS — R1314 Dysphagia, pharyngoesophageal phase: Secondary | ICD-10-CM | POA: Diagnosis not present

## 2024-01-16 DIAGNOSIS — E559 Vitamin D deficiency, unspecified: Secondary | ICD-10-CM | POA: Diagnosis not present

## 2024-02-12 ENCOUNTER — Ambulatory Visit: Admitting: Podiatry

## 2024-04-02 DIAGNOSIS — Z008 Encounter for other general examination: Secondary | ICD-10-CM | POA: Diagnosis not present

## 2024-04-13 ENCOUNTER — Encounter

## 2024-04-17 ENCOUNTER — Telehealth: Payer: Self-pay | Admitting: Family Medicine

## 2024-04-17 NOTE — Telephone Encounter (Signed)
 Copied from CRM [id number]. Topic: General - Other >> Apr 17, 2024  2:10 PM Martinique E wrote: Reason for CRM: Patient called in stating that she needs some help with housekeeping and was advised to reach out to her PCP in order for paperwork to be filled out and completed. Please reach out to patient at [phone] for any additional information.

## 2024-04-20 NOTE — Telephone Encounter (Signed)
 I am not sure what she is looking for. Home health can help with her ADL's etc but not with house keeping

## 2024-04-21 NOTE — Telephone Encounter (Signed)
 Spoke with pt advised that Home Health agencies don't offer Housekeeping, voiced understanding

## 2024-05-18 ENCOUNTER — Encounter: Payer: Self-pay | Admitting: Radiology

## 2024-06-05 ENCOUNTER — Telehealth: Payer: Self-pay | Admitting: Family Medicine

## 2024-06-05 NOTE — Telephone Encounter (Signed)
 Copied from CRM #[id number]. Topic: Clinical - Medication Refill >> Jun 05, 2024  9:04 AM Winona R wrote: Medication: enalapril  (VASOTEC ) 20 MG tablet  It was previously agreed upon between pt and Dr. Johnny to discontinue it. But pt states she has started to retake it because it has gone up pretty high. She is running out of the medication she had left off.

## 2024-06-05 NOTE — Telephone Encounter (Unsigned)
 Copied from CRM #[id number]. Topic: Clinical - Medication Refill >> Jun 05, 2024  9:04 AM Winona R wrote: Medication: enalapril  (VASOTEC ) 20 MG tablet  It was previously agreed upon between pt and Dr. Johnny to discontinue it. But pt states she has started to retake it because it has gone up pretty high. She is running out of the medication she had left off.  Has the patient contacted their pharmacy? Yes (Agent: If no, request that the patient contact the pharmacy for the refill. If patient does not wish to contact the pharmacy document the reason why and proceed with request.) (Agent: If yes, when and what did the pharmacy advise?)  This is the patient's preferred pharmacy:  [id number] - [place], [place] - [address] AT [address] OF [facility] [address] [place] [address] Phone: [phone] Fax: [phone]  Is this the correct pharmacy for this prescription? Yes If no, delete pharmacy and type the correct one.   Has the prescription been filled recently? No- discontinued   Is the patient out of the medication? No  Has the patient been seen for an appointment in the last year OR does the patient have an upcoming appointment? Yes  Can we respond through MyChart? No  Agent: Please be advised that Rx refills may take up to 3 business days. We ask that you follow-up with your pharmacy.

## 2024-06-10 MED ORDER — ENALAPRIL MALEATE 20 MG PO TABS: 20.0000 mg | ORAL_TABLET | Freq: Every day | ORAL | 2 refills | Status: AC

## 2024-06-10 NOTE — Telephone Encounter (Signed)
 I sent in the RX. Make an in person OV to follow up in 3-4 weeks

## 2024-06-16 NOTE — Telephone Encounter (Signed)
 Attempted to call pt back no success speaking with pt will try again

## 2024-06-25 DIAGNOSIS — Z0100 Encounter for examination of eyes and vision without abnormal findings: Secondary | ICD-10-CM | POA: Diagnosis not present

## 2024-06-25 DIAGNOSIS — H5211 Myopia, right eye: Secondary | ICD-10-CM | POA: Diagnosis not present

## 2024-07-01 NOTE — Telephone Encounter (Signed)
 Attempted to contact pt several times, phone message pt has the office  phone number blocked. No success speaking with pt.

## 2024-07-27 ENCOUNTER — Encounter (HOSPITAL_BASED_OUTPATIENT_CLINIC_OR_DEPARTMENT_OTHER): Payer: Self-pay | Admitting: Emergency Medicine

## 2024-07-27 ENCOUNTER — Emergency Department (HOSPITAL_BASED_OUTPATIENT_CLINIC_OR_DEPARTMENT_OTHER)
Admission: EM | Admit: 2024-07-27 | Discharge: 2024-07-27 | Disposition: A | Discharge status: Home / Self Care | Attending: Emergency Medicine | Admitting: Emergency Medicine

## 2024-07-27 ENCOUNTER — Other Ambulatory Visit: Payer: Self-pay

## 2024-07-27 ENCOUNTER — Emergency Department (HOSPITAL_BASED_OUTPATIENT_CLINIC_OR_DEPARTMENT_OTHER): Admitting: Radiology

## 2024-07-27 DIAGNOSIS — F419 Anxiety disorder, unspecified: Secondary | ICD-10-CM | POA: Diagnosis not present

## 2024-07-27 DIAGNOSIS — R002 Palpitations: Secondary | ICD-10-CM | POA: Insufficient documentation

## 2024-07-27 LAB — BASIC METABOLIC PANEL WITH GFR
Anion gap: 12 (ref 5–15)
BUN: 14 mg/dL (ref 8–23)
CO2: 28 mmol/L (ref 22–32)
Calcium: 9.7 mg/dL (ref 8.9–10.3)
Chloride: 100 mmol/L (ref 98–111)
Creatinine, Ser: 0.74 mg/dL (ref 0.44–1.00)
GFR, Estimated: >60 mL/min
Glucose, Bld: 150 mg/dL — ABNORMAL HIGH (ref 70–99)
Potassium: 3.5 mmol/L (ref 3.5–5.1)
Sodium: 139 mmol/L (ref 135–145)

## 2024-07-27 LAB — CBC
HCT: 44.3 % (ref 36.0–46.0)
Hemoglobin: 15 g/dL (ref 12.0–15.0)
MCH: 29.9 pg (ref 26.0–34.0)
MCHC: 33.9 g/dL (ref 30.0–36.0)
MCV: 88.4 fL (ref 80.0–100.0)
Platelets: 161 K/uL (ref 150–400)
RBC: 5.01 MIL/uL (ref 3.87–5.11)
RDW: 15.4 % (ref 11.5–15.5)
WBC: 4.7 K/uL (ref 4.0–10.5)
nRBC: 0 % (ref 0.0–0.2)

## 2024-07-27 LAB — TROPONIN T, HIGH SENSITIVITY: Troponin T High Sensitivity: 17 ng/L (ref 0–19)

## 2024-07-27 LAB — MAGNESIUM: Magnesium: 2.1 mg/dL (ref 1.7–2.4)

## 2024-07-27 NOTE — ED Triage Notes (Signed)
 Radiology reports- Refusing chest XR at this time. Would like to see provider first.

## 2024-07-27 NOTE — ED Triage Notes (Signed)
 Htn at home 150/70's at the time of triage. 200/100 this morning. Has had moments of facial pain several days ago. Palpitations.

## 2024-07-27 NOTE — ED Provider Notes (Signed)
 " [name] EMERGENCY DEPARTMENT AT [facility] Provider Note   CSN: [id number] Arrival date & time: 07/27/24  2051     Patient presents with: Palpitations   [name] is a 79 y.o. female.   Patient here with some palpitations here this morning.  She has been anxious.  She denies any weakness numbness tingling.  No chest pain no weakness numbness tingling.  She had some sort of facial spasm a couple days ago but that lasted shortly.  Has resolved now.  She is not having any headache cough sputum production chest pain shortness of breath weakness numbness tingling.  She does states she has been under a lot of anxiety with things at home.  She has depression but denies any SI HI.  She does have some chronic pain.  She denies any vision loss speech changes.  The history is provided by the patient.       Prior to Admission medications  Medication Sig Start Date End Date Taking? Authorizing Provider  acetaminophen  (TYLENOL ) 325 MG tablet Take 2 tablets (650 mg total) by mouth every 6 (six) hours as needed for mild pain (pain score 1-3) or fever (pain score 1-3 or temp > 100.5). 06/04/23   Hongalgi, Anand D, MD  enalapril  (VASOTEC ) 20 MG tablet Take 1 tablet (20 mg total) by mouth daily. 06/10/24   Johnny Garnette LABOR, MD    Allergies: Amoxicillin and Meloxicam    Review of Systems  Updated Vital Signs BP (!) 158/73 (BP Location: Left Arm)   Pulse 96   Temp 98.1 F (36.7 C)   Resp 16   SpO2 99%   Physical Exam Vitals and nursing note reviewed.  Constitutional:      General: She is not in acute distress.    Appearance: She is well-developed. She is not ill-appearing.  HENT:     Head: Normocephalic and atraumatic.     Nose: Nose normal.     Mouth/Throat:     Mouth: Mucous membranes are moist.  Eyes:     Extraocular Movements: Extraocular movements intact.     Conjunctiva/sclera: Conjunctivae normal.     Pupils: Pupils are equal, round, and reactive to light.   Cardiovascular:     Rate and Rhythm: Normal rate and regular rhythm.     Pulses: Normal pulses.     Heart sounds: Normal heart sounds. No murmur heard. Pulmonary:     Effort: Pulmonary effort is normal. No respiratory distress.     Breath sounds: Normal breath sounds.  Abdominal:     Palpations: Abdomen is soft.     Tenderness: There is no abdominal tenderness.  Musculoskeletal:        General: No swelling.     Cervical back: Normal range of motion and neck supple.  Skin:    General: Skin is warm and dry.     Capillary Refill: Capillary refill takes less than 2 seconds.  Neurological:     General: No focal deficit present.     Mental Status: She is alert and oriented to person, place, and time.     Cranial Nerves: No cranial nerve deficit.     Sensory: No sensory deficit.     Motor: No weakness.     Coordination: Coordination normal.     Comments: 5+ out of 5 strength throughout, normal sensation, no drift, normal speech normal visual fields  Psychiatric:        Mood and Affect: Mood normal.  Comments: Mildly anxious but well-appearing otherwise no SI     (all labs ordered are listed, but only abnormal results are displayed) Labs Reviewed  CBC  BASIC METABOLIC PANEL WITH GFR  MAGNESIUM   TROPONIN T, HIGH SENSITIVITY    EKG: EKG Interpretation Date/Time:  Monday July 27 2024 21:10:53 EST Ventricular Rate:  86 PR Interval:  150 QRS Duration:  72 QT Interval:  474 QTC Calculation: 567 R Axis:   37  Text Interpretation: Normal sinus rhythm Right atrial enlargement When compared with ECG of 02-Jun-2023 10:31, Confirmed by Ruthe Cornet [phone]) on 07/27/2024 9:11:58 PM  Radiology: No results found.   Procedures   Medications Ordered in the ED - No data to display                                  Medical Decision Making Amount and/or Complexity of Data Reviewed Labs: ordered.   [name] is here with palpitations.  Unremarkable vitals.  No  fever.  History of anxiety depression.  I have no concern for stroke.  She is neurologically intact.  She does admit to anxiety.  Differential diagnosis likely PACs PVCs anxiety causing palpitations but will look for electrolyte abnormality anemia.  Seems less likely to be thyroid  or vitamin related process but she can follow-up with primary care about this.  Does not have any headache.  No vision loss.  No need for any head imaging.  She declined chest x-ray but not having any chest pain or shortness of breath.  No signs of volume overload on exam.  EKG shows sinus rhythm.  No ischemic changes.  Have noticed some PVCs under telemetry.  Overall lab work shows no leukocytosis anemia or electrolyte abnormality.  Troponin normal.  Overall we will have her follow-up with primary care to talk about Zio patch heart monitor thyroid  studies vitamin studies and further discussion about her anxiety depression which I think is likely the cause of her symptoms today.  I have no concern for any other infectious process or any other acute process.  Discharge.  Understands return precautions.  This chart was dictated using voice recognition software.  Despite best efforts to proofread,  errors can occur which can change the documentation meaning.      Final diagnoses:  Palpitations    ED Discharge Orders     None          Ruthe Cornet, DO 07/27/24 2236  "

## 2024-07-27 NOTE — Discharge Instructions (Addendum)
 Follow-up with your primary care doctor further to discuss today's visit.  Workup today is unremarkable.
